# Patient Record
Sex: Female | Born: 1990 | Race: Black or African American | Hispanic: No | Marital: Married | Smoking: Never smoker
Health system: Southern US, Community
[De-identification: ages and names within clinical notes are randomized; demographics above are authoritative.]

## PROBLEM LIST (undated history)

## (undated) ENCOUNTER — Inpatient Hospital Stay (HOSPITAL_COMMUNITY): Payer: Self-pay

## (undated) DIAGNOSIS — K439 Ventral hernia without obstruction or gangrene: Secondary | ICD-10-CM

## (undated) DIAGNOSIS — Z87440 Personal history of urinary (tract) infections: Secondary | ICD-10-CM

## (undated) DIAGNOSIS — O139 Gestational [pregnancy-induced] hypertension without significant proteinuria, unspecified trimester: Secondary | ICD-10-CM

## (undated) HISTORY — PX: NO PAST SURGERIES: SHX2092

## (undated) HISTORY — DX: Personal history of urinary (tract) infections: Z87.440

## (undated) HISTORY — PX: HERNIA REPAIR: SHX51

---
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
## 2011-03-13 ENCOUNTER — Encounter (HOSPITAL_COMMUNITY): Payer: Self-pay | Admitting: *Deleted

## 2012-10-01 ENCOUNTER — Encounter: Payer: Self-pay | Admitting: Obstetrics & Gynecology

## 2012-10-01 ENCOUNTER — Ambulatory Visit (INDEPENDENT_AMBULATORY_CARE_PROVIDER_SITE_OTHER): Payer: BC Managed Care – PPO | Admitting: Obstetrics & Gynecology

## 2012-10-01 VITALS — BP 114/75 | HR 80 | Temp 98.4°F | Ht 60.0 in | Wt 138.0 lb

## 2012-10-01 DIAGNOSIS — Z01419 Encounter for gynecological examination (general) (routine) without abnormal findings: Secondary | ICD-10-CM

## 2012-10-01 DIAGNOSIS — N949 Unspecified condition associated with female genital organs and menstrual cycle: Secondary | ICD-10-CM

## 2012-10-01 DIAGNOSIS — Z3202 Encounter for pregnancy test, result negative: Secondary | ICD-10-CM

## 2012-10-01 DIAGNOSIS — Z309 Encounter for contraceptive management, unspecified: Secondary | ICD-10-CM

## 2012-10-01 LAB — HEMOGLOBIN AND HEMATOCRIT, BLOOD
HCT: 41 % (ref 36.0–46.0)
Hemoglobin: 13.8 g/dL (ref 12.0–15.0)

## 2012-10-01 LAB — POCT URINE PREGNANCY: Preg Test, Ur: NEGATIVE

## 2012-10-01 LAB — RPR

## 2012-10-01 MED ORDER — MEDROXYPROGESTERONE ACETATE 150 MG/ML IM SUSP: 150.0000 mg | INTRAMUSCULAR | Status: DC

## 2012-10-01 NOTE — Patient Instructions (Addendum)
HPV Vaccine Questions and Answers WHAT IS HUMAN PAPILLOMAVIRUS (HPV)? HPV is a virus that can lead to cervical cancer; vulvar and vaginal cancers; penile cancer; anal cancer and genital warts (warts in the genital areas). More than 1 vaccine is available to help you or your child with protection against HPV. Your caregiver can talk to you about which one might give you the best protection. WHO SHOULD GET THIS VACCINE? The HPV vaccine is most effective when given before the onset of sexual activity.  This vaccine is recommended for girls 47 or 21 years of age. It can be given to girls as young as 21 years old.  HPV vaccine can be given to males, 9 through 21 years of age, to reduce the likelihood of acquiring genital warts.  HPV vaccine can be given to males and females aged 25 through 26 years to prevent anal cancer. HPV vaccine is not generally recommended after age 34, because most individuals have been exposed to the HPV virus by that age. HOW EFFECTIVE IS THIS VACCINE?  The vaccine is generally effective in preventing cervical; vulvar and vaginal cancers; penile cancer; anal cancer and genital warts caused by 4 types of HPV. The vaccine is less effective in those individuals who are already infected with HPV. This vaccine does not treat existing HPV, genital warts, pre-cancers or cancers. WILL SEXUALLY ACTIVE INDIVIDUALS BENEFIT FROM THE VACCINE? Sexually active individuals may still benefit from the vaccine but may get less benefit due to previous HPV exposure. HOW AND WHEN IS THE VACCINE ADMINISTERED? The vaccine is given in a series of 3 injections (shots) over a 6 month period in both males and females. The exact timing depends on which specific vaccine your caregiver recommends for you. IS THE HPV VACCINE SAFE?  The federal government has approved the HPV vaccine as safe and effective. This vaccine was tested in both males and females in many countries around the world. The most common  side effect is soreness at the injection site. Since the drug became approved, there has been some concern about patients passing out after being vaccinated, which has led to a recommendation of a 15 minute waiting period following vaccination. This practice may decrease the small risk of passing out. Additionally there is a rare risk of anaphylaxis (an allergic reaction) to the vaccine and a risk of a blood clot among individuals with specific risk factors for a blood clot. DOES THIS VACCINE CONTAIN THIMEROSAL OR MERCURY? No. There is no thimerosal or mercury in the HPV vaccine. It is made of proteins from the outer coat of the virus (HPV). There is no infectious material in this vaccine. WILL GIRLS/WOMEN WHO HAVE BEEN VACCINATED STILL NEED CERVICAL CANCER SCREENING? Yes. There are 3 reasons why women will still need regular cervical cancer screening. First, the vaccine will NOT provide protection against all types of HPV that cause cervical cancer. Vaccinated women will still be at risk for some cancers. Second, some women may not get all required doses of the vaccine (or they may not get them at the recommended times). Therefore, they may not get the vaccine's full benefits. Third, women may not get the full benefit of the vaccine if they receive it after they have already acquired any of the 4 types of HPV. WILL THE HPV VACCINE BE COVERED BY INSURANCE PLANS? While some insurance companies may cover the vaccine, others may not. Most large group insurance plans cover the costs of recommended vaccines. WHAT KIND OF GOVERNMENT PROGRAMS  MAY BE AVAILABLE TO COVER HPV VACCINE? Federal health programs such as Vaccines for Children [facility]) will cover the HPV vaccine. The [facility] program provides free vaccines to children and adolescents under 6 years of age, who are either uninsured, Medicaid-eligible, [place] or [place] Native. There are over 45,000 sites that provide [facility] vaccines including hospital, private  and public clinics. The [facility] program also allows children and adolescents to get VFC vaccines through [facility] or [facility] if their private health insurance does not cover the vaccine. Some states also provide free or low-cost vaccines, at public health clinics, to people without health insurance coverage for vaccines. GENITAL HPV: WHY IS HPV IMPORTANT? Genital HPV is the most common virus transmitted through genital contact, most often during vaginal and anal sex. About 40 types of HPV can infect the genital areas of men and women. While most HPV types cause no symptoms and go away on their own, some types can cause cervical cancer in women. These types also cause other less common genital cancers, including cancers of the penis, anus, vagina (birth canal), and vulva (area around the opening of the vagina). Other types of HPV can cause genital warts in men and women. HOW COMMON IS HPV?   At least 50% of sexually active people will get HPV at some time in their lives. HPV is most common in young women and men who are in their late teens and early 50s.  Anyone who has ever had genital contact with another person can get HPV. Both men and women can get it and pass it on to their sex partners without realizing it. IS HPV THE SAME THING AS HIV OR HERPES? HPV is NOT the same as HIV or Herpes (Herpes simplex virus or HSV). While these are all viruses that can be sexually transmitted, HIV and HSV do not cause the same symptoms or health problems as HPV. CAN HPV AND ITS ASSOCIATED DISEASES BE TREATED? There is no treatment for HPV. There are treatments for the health problems that HPV can cause, such as genital warts, cervical cell changes, and cancers of the cervix (lower part of the womb), vulva, vagina and anus.  HOW IS HPV RELATED TO CERVICAL CANCER? Some types of HPV can infect a woman's cervix and cause the cells to change in an abnormal way. Most of the time, HPV goes  away on its own. When HPV is gone, the cervical cells go back to normal. Sometimes, HPV does not go away. Instead, it lingers (persists) and continues to change the cells on a woman's cervix. These cell changes can lead to cancer over time if they are not treated. ARE THERE OTHER WAYS TO PREVENT CERVICAL CANCER? Regular Pap tests and follow-up can prevent most, but not all, cases of cervical cancer. Pap tests can detect cell changes (or pre-cancers) in the cervix before they turn into cancer. Pap tests can also detect most, but not all, cervical cancers at an early, curable stage. Most women diagnosed with cervical cancer have either never had a Pap test, or not had a Pap test in the last 5 years. There is also an HPV DNA test available for use with the Pap test as part of cervical cancer screening. This test may be ordered for women over 30 or for women who get an unclear (borderline) Pap test result. While this test can tell if a woman has HPV on her cervix, it cannot tell which types of HPV she has.  If the HPV DNA test is negative for HPV DNA, then screening may be done every 3 years. If the HPV DNA test is positive for HPV DNA, then screening should be done every 6 to 12 months. OTHER QUESTIONS ABOUT THE HPV VACCINE WHAT HPV TYPES DOES THE VACCINE PROTECT AGAINST? The HPV vaccine protects against the HPV types that cause most (70%) cervical cancers (types 16 and 18), most (78%) anal cancers (types 16 and 18) and the two HPV types that cause most (90%) genital warts (types 6 and 11). WHAT DOES THE VACCINE NOT PROTECT AGAINST?  Because the vaccine does not protect against all types of HPV, it will not prevent all cases of cervical cancer, anal cancer, other genital cancers or genital warts. About 30% of cervical cancers are not prevented with vaccination, so it will be important for women to continue screening for cervical cancer (regular Pap tests). Also, the vaccine does not prevent about 10% of genital  warts nor will it prevent other sexually transmitted infections (STIs), including HIV. Therefore, it will still be important for sexually active adults to practice safe sex to reduce exposure to HPV and other STI's. HOW LONG DOES VACCINE PROTECTION LAST? WILL A BOOSTER SHOT BE NEEDED? So far, studies have followed women for 5 years and found that they are still protected. Currently, additional (booster) doses are not recommended. More research is being done to find out how long protection will last, and if a booster vaccine is needed years later.  WHY IS THE HPV VACCINE RECOMMENDED AT SUCH A YOUNG AGE? Ideally, males and females should get the vaccine before they are sexually active since this vaccine is most effective in individuals who have not yet acquired any of the HPV vaccine types. Individuals who have not been infected with any of the 4 types of HPV will get the full benefits of the vaccine.  SHOULD PREGNANT WOMEN BE VACCINATED? The vaccine is not recommended for pregnant women. There has been limited research looking at vaccine safety for pregnant women and their developing fetus. Studies suggest that the vaccine has not caused health problems during pregnancy, nor has it caused health problems for the infant. Pregnant women should complete their pregnancy before getting the vaccine. If a woman finds out she is pregnant after she has started getting the vaccine series, she should complete her pregnancy before finishing the 3 doses. SHOULD BREASTFEEDING MOTHERS BE VACCINATED? Mothers nursing their babies may get the vaccine because the virus is inactivated and will not harm the mother or baby. WILL INDIVIDUALS BE PROTECTED AGAINST HPV AND RELATED DISEASES, EVEN IF THEY DO NOT GET ALL 3 DOSES? It is not yet known how much protection individuals will get from receiving only 1 or 2 doses of the vaccine. For this reason, it is very important that individuals get all 3 doses of the vaccine. WILL  CHILDREN BE REQUIRED TO BE VACCINATED TO ENTER SCHOOL? There are no federal laws that require children or adolescents to get vaccinated. All school entry laws are state laws so they vary from state to state. To find out what vaccines are needed for children or adolescents to enter school in your state, check with your state health department or board of education. ARE THERE OTHER WAYS TO PREVENT HPV? The only sure way to prevent HPV is to abstain from all sexual activity. Sexually active adults can reduce their risk by being in a mutually monogamous relationship with someone who has had no other sex partners.  But even individuals with only 1 lifetime sex partner can get HPV, if their partner has had a previous partner with HPV. It is unknown how much protection condoms provide against HPV, since areas that are not covered by a condom can be exposed to the virus. However, condoms may reduce the risk of genital warts and cervical cancer. They can also reduce the risk of HIV and some other sexually transmitted infections (STIs), when used consistently and correctly (all the time and the right way). Document Released: 05/14/2005 Document Revised: 08/06/2011 Document Reviewed: 01/07/2009 [facility] Patient Information 2013 [place], [place].  Safer Sex Your caregiver wants you to have this information about the infections that can be transmitted from sexual contact and how to prevent them. The idea behind safer sex is that you can be sexually active, and at the same time reduce the risk of giving or getting a sexually transmitted disease (STD). Every person should be aware of how to prevent him or herself and his or her sex partner from getting an STD. CAUSES OF STDS STDs are transmitted by sharing body fluids, which contain viruses and bacteria. The following fluids all transmit infections during sexual intercourse and sex acts:  Semen.  Saliva.  Urine.  Blood.  Vaginal mucus. Examples of STDs  include:  Chlamydia.  Gonorrhea.  Genital herpes.  Hepatitis B.  Human immunodeficiency virus or acquired immunodeficiency syndrome (HIV or AIDS).  Syphilis.  Trichomonas.  Pubic lice.  Human papillomavirus (HPV), which may include:  Genital warts.  Cervical dysplasia.  Cervical cancer (can develop with certain types of HPV). SYMPTOMS  Sexual diseases often cause few or no symptoms until they are advanced, so a person can be infected and spread the infection without knowing it. Some STDs respond to treatment very well. Others, like HIV and herpes, cannot be cured, but are treated to reduce their effects. Specific symptoms include:  Abnormal vaginal discharge.  Irritation or itching in and around the vagina, and in the pubic hair.  Pain during sexual intercourse.  Bleeding during sexual intercourse.  Pelvic or abdominal pain.  Fever.  Growths in and around the vagina.  An ulcer in or around the vagina.  Swollen glands in the groin area. DIAGNOSIS   Blood tests.  Pap test.  Culture test of abnormal vaginal discharge.  A test that applies a solution and examines the cervix with a lighted magnifying scope (colposcopy).  A test that examines the pelvis with a lighted tube, through a small incision (laparoscopy). TREATMENT  The treatment will depend on the cause of the STD.  Antibiotic treatment by injection, oral, creams, or suppositories in the vagina.  Over-the-counter medicated shampoo, to get rid of pubic lice.  Removing or treating growths with medicine, freezing, burning (electrocautery), or surgery.  Surgery treatment for HPV of the cervix.  Supportive medicines for herpes, HIV, AIDS, and hepatitis. Being careful cannot eliminate all risk of infection, but sex can be made much safer. Safe sexual practices include body massage and gentle touching. Masturbation is safe, as long as body fluids do not contact skin that has sores or cuts. Dry kissing  and oral sex on a man wearing a latex condom or on a woman wearing a female condom is also safe. Slightly less safe is intercourse while the man wears a latex condom or wet kissing. It is also safer to have one sex partner that you know is not having sex with anyone else. LENGTH OF ILLNESS An STD might be treated and  cured in a week, sometimes a month, or more. And it can linger with symptoms for many years. STDs can also cause damage to the female organs. This can cause chronic pain, infertility, and recurrence of the STD, especially herpes, hepatitis, HIV, and HPV. HOME CARE INSTRUCTIONS AND PREVENTION  Alcohol and recreational drugs are often the reason given for not practicing safer sex. These substances affect your judgment. Alcohol and recreational drugs can also impair your immune system, making you more vulnerable to disease.  Do not engage in risky and dangerous sexual practices, including:  Vaginal or anal sex without a condom.  Oral sex on a man without a condom.  Oral sex on a woman without a female condom.  Using saliva to lubricate a condom.  Any other sexual contact in which body fluids or blood from one partner contact the other partner.  You should use only latex condoms for men and water soluble lubricants. Petroleum based lubricants or oils used to lubricate a condom will weaken the condom and increase the chance that it will break.  Think very carefully before having sex with anyone who is high risk for STDs and HIV. This includes IV drug users, people with multiple sexual partners, or people who have had an STD, or a positive hepatitis or HIV blood test.  Remember that even if your partner has had only one previous partner, their previous partner might have had multiple partners. If so, you are at high risk of being exposed to an STD. You and your sex partner should be the only sex partners with each other, with no one else involved.  A vaccine is available for hepatitis  B and HPV through your caregiver or the Public Health Department. Everyone should be vaccinated with these vaccines.  Avoid risky sex practices. Sex acts that can break the skin make you more likely to get an STD. SEEK MEDICAL CARE IF:   If you think you have an STD, even if you do not have any symptoms. Contact your caregiver for evaluation and treatment, if needed.  You think or know your sex partner has acquired an STD.  You have any of the symptoms mentioned above. Document Released: 06/21/2004 Document Revised: 08/06/2011 Document Reviewed: 04/13/2009 [facility] Patient Information 2013 [place], [place].  Pelvic Pain Pelvic pain is pain below the belly button and located between your hips. Acute pain may last a few hours or days. Chronic pelvic pain may last weeks and months. The cause may be different for different types of pain. The pain may be dull or sharp, mild or severe and can interfere with your daily activities. Write down and tell your caregiver:   Exactly where the pain is located.  If it comes and goes or is there all the time.  When it happens (with sex, urination, bowel movement, etc.)  If the pain is related to your menstrual period or stress. Your caregiver will take a full history and do a complete physical exam and Pap test. CAUSES   Painful menstrual periods (dysmenorrhea).  Normal ovulation (Mittelschmertz) that occurs in the middle of the menstrual cycle every month.  The pelvic organs get engorged with blood just before the menstrual period (pelvic congestive syndrome).  Scar tissue from an infection or past surgery (pelvic adhesions).  Cancer of the female pelvic organs. When there is pain with cancer, it has been there for a long time.  The lining of the uterus (endometrium) abnormally grows in places like the pelvis and on  the pelvic organs (endometriosis).  A form of endometriosis with the lining of the uterus present inside of the muscle tissue of  the uterus (adenomyosis).  Fibroid tumor (noncancerous) in the uterus.  Bladder problems such as infection, bladder spasms of the muscle tissue of the bladder.  Intestinal problems (irritable bowel syndrome, colitis, an ulcer or gastrointestinal infection).  Polyps of the cervix or uterus.  Pregnancy in the tube (ectopic pregnancy).  The opening of the cervix is too small for the menstrual blood to flow through it (cervical stenosis).  Physical or sexual abuse (past or present).  Musculo-skeletal problems from poor posture, problems with the vertebrae of the lower back or the uterine pelvic muscles falling (prolapse).  Psychological problems such as depression or stress.  IUD (intrauterine device) in the uterus. DIAGNOSIS  Tests to make a diagnosis depends on the type, location, severity and what causes the pain to occur. Tests that may be needed include:  Blood tests.  Urine tests  Ultrasound.  X-rays.  CT Scan.  MRI.  Laparoscopy.  Major surgery. TREATMENT  Treatment will depend on the cause of the pain, which includes:  Prescription or over-the-counter pain medication.  Antibiotics.  Birth control pills.  Hormone treatment.  Nerve blocking injections.  Physical therapy.  Antidepressants.  Counseling with a psychiatrist or psychologist.  Minor or major surgery. HOME CARE INSTRUCTIONS   Only take over-the-counter or prescription medicines for pain, discomfort or fever as directed by your caregiver.  Follow your caregiver's advice to treat your pain.  Rest.  Avoid sexual intercourse if it causes the pain.  Apply warm or cold compresses (which ever works best) to the pain area.  Do relaxation exercises such as yoga or meditation.  Try acupuncture.  Avoid stressful situations.  Try group therapy.  If the pain is because of a stomach/intestinal upset, drink clear liquids, eat a bland light food diet until the symptoms go away. SEEK MEDICAL  CARE IF:   You need stronger prescription pain medication.  You develop pain with sexual intercourse.  You have pain with urination.  You develop a temperature of 102 F (38.9 C) with the pain.  You are still in pain after 4 hours of taking prescription medication for the pain.  You need depression medication.  Your IUD is causing pain and you want it removed. SEEK IMMEDIATE MEDICAL CARE IF:  You develop very severe pain or tenderness.  You faint, have chills, severe weakness or dehydration.  You develop heavy vaginal bleeding or passing solid tissue.  You develop a temperature of 102 F (38.9 C) with the pain.  You have blood in the urine.  You are being physically or sexually abused.  You have uncontrolled vomiting and diarrhea.  You are depressed and afraid of harming yourself or someone else. Document Released: 06/21/2004 Document Revised: 08/06/2011 Document Reviewed: 03/18/2008 [facility] Patient Information 2013 [place], [place].  Health Maintenance, 49- to 78-Year-Old SCHOOL PERFORMANCE After high school completion, the young adult may be attending college, Scientist, product/process development or vocational school, or entering the [employer] or the work force. SOCIAL AND EMOTIONAL DEVELOPMENT The young adult establishes adult relationships and explores sexual identity. Young adults may be living at home or in a college dorm or apartment. Increasing independence is important with young adults. Throughout adolescence, teens should assume responsibility of their own health care. IMMUNIZATIONS Most young adults should be fully vaccinated. A booster dose of Tdap (tetanus, diphtheria, and pertussis, or "whooping cough"), a dose of meningococcal vaccine to protect  against a certain type of bacterial meningitis, hepatitis A, human papillomarvirus (HPV), chickenpox, or measles vaccines may be indicated, if not given at an earlier age. Annual influenza or "flu" vaccination should be considered during flu  season.  TESTING Annual screening for vision and hearing problems is recommended. Vision should be screened objectively at least once between 64 and 63 years of age. The young adult may be screened for anemia or tuberculosis. Young adults should have a blood test to check for high cholesterol during this time period. Young adults should be screened for use of alcohol and drugs. If the young adult is sexually active, screening for sexually transmitted infections, pregnancy, or HIV may be performed. Screening for cervical cancer should be performed within 3 years of beginning sexual activity. NUTRITION AND ORAL HEALTH  Adequate calcium intake is important. Consume 3 servings of low-fat milk and dairy products daily. For those who do not drink milk or consume dairy products, calcium enriched foods, such as juice, bread, or cereal, dark, leafy greens, or canned fish are alternate sources of calcium.  Drink plenty of water. Limit fruit juice to 8 to 12 ounces per day. Avoid sugary beverages or sodas.  Discourage skipping meals, especially breakfast. Teens should eat a good variety of vegetables and fruits, as well as lean meats.  Avoid high fat, high salt, and high sugar foods, such as candy, chips, and cookies.  Encourage young adults to participate in meal planning and preparation.  Eat meals together as a family whenever possible. Encourage conversation at mealtime.  Limit fast food choices and eating out at restaurants.  Brush teeth twice a day and floss.  Schedule dental exams twice a year. SLEEP Regular sleep habits are important. PHYSICAL, SOCIAL, AND EMOTIONAL DEVELOPMENT  One hour of regular physical activity daily is recommended. Continue to participate in sports.  Encourage young adults to develop their own interests and consider community service or volunteerism.  Provide guidance to the young adult in making decisions about college and work plans.  Make sure that young adults  know that they should never be in a situation that makes them uncomfortable, and they should tell partners if they do not want to engage in sexual activity.  Talk to the young adult about body image. Eating disorders may be noted at this time. Young adults may also be concerned about being overweight. Monitor the young adult for weight gain or loss.  Mood disturbances, depression, anxiety, alcoholism, or attention problems may be noted in young adults. Talk to the caregiver if there are concerns about mental illness.  Negotiate limit setting and independent decision making.  Encourage the young adult to handle conflict without physical violence.  Avoid loud noises which may impair hearing.  Limit television and computer time to 2 hours per day. Individuals who engage in excessive sedentary activity are more likely to become overweight. RISK BEHAVIORS  Sexually active young adults need to take precautions against pregnancy and sexually transmitted infections. Talk to young adults about contraception.  Provide a tobacco-free and drug-free environment for the young adult. Talk to the young adult about drug, tobacco, and alcohol use among friends or at friends' homes. Make sure the young adult knows that smoking tobacco or marijuana and taking drugs have health consequences and may impact brain development.  Teach the young adult about appropriate use of over-the-counter or prescription medicines.  Establish guidelines for driving and for riding with friends.  Talk to young adults about the risks of drinking and  driving or boating. Encourage the young adult to call you if he or she or friends have been drinking or using drugs.  Remind young adults to wear seat belts at all times in cars and life vests in boats.  Young adults should always wear a properly fitted helmet when they are riding a bicycle.  Use caution with all-terrain vehicles (ATVs) or other motorized vehicles.  Do not keep  handguns in the home. (If you do, the gun and ammunition should be locked separately and out of the young adult's access.)  Equip your home with smoke detectors and change the batteries regularly. Make sure all family members know the fire escape plans for your home.  Teach young adults not to swim alone and not to dive in shallow water.  All individuals should wear sunscreen that protects against UVA and UVB light with at least a sun protection factor (SPF) of 30 when out in the sun. This minimizes sun burning. WHAT'S NEXT? Young adults should visit their pediatrician or family physician yearly. By young adulthood, health care should be transitioned to a family physician or internal medicine specialist. Sexually active females may want to begin annual physical exams with a gynecologist. Document Released: 08/09/2006 Document Revised: 08/06/2011 Document Reviewed: 08/29/2006 [facility] Patient Information 2013 [place], [place].

## 2012-10-01 NOTE — Progress Notes (Signed)
.   Subjective:     Kayla [name] is a 22 y.o. female here for a routine exam.  Current complaints: abdominal pains over the last two months.  Usually happens twice a week in the morning.  Personal health questionnaire reviewed: no.  See pelvic pain questionnaire.   Gynecologic History Patient's last menstrual period was 09/18/2012. Contraception: none Last Pap: N/A  Last mammogram: N/A  Obstetric History OB History   Grav Para Term Preterm Abortions TAB SAB Ect Mult Living   1 1 1             # Outc Date GA Lbr Len/2nd Wgt Sex Del Anes PTL Lv   1 TRM 5/12 [redacted]w[redacted]d 00:00  M           The following portions of the patient's history were reviewed and updated as appropriate: allergies, current medications, past family history, past medical history, past social history, past surgical history and problem list.  Review of Systems Pertinent items are noted in HPI.    Objective:    General appearance: alert Breasts: normal appearance, no masses or tenderness Abdomen: soft, non-tender; bowel sounds normal; no masses,  no organomegaly Pelvic: cervix normal in appearance, external genitalia normal, no adnexal masses or tenderness, uterus normal size, shape, and consistency and vagina normal without discharge   Informal pelvic U/S: wnl  Assessment:   Subacute pelvic pain--?etiology  Plan:   Restart Depo provera and HPV vaccine next visit in 2 weeks

## 2012-10-02 LAB — PAP IG AND CT-NG NAA
Chlamydia Probe Amp: NEGATIVE
GC Probe Amp: NEGATIVE

## 2012-10-02 LAB — WET PREP BY MOLECULAR PROBE
Candida species: NEGATIVE
Trichomonas vaginosis: NEGATIVE

## 2012-11-03 ENCOUNTER — Encounter (HOSPITAL_COMMUNITY): Payer: Self-pay

## 2012-11-03 ENCOUNTER — Inpatient Hospital Stay (HOSPITAL_COMMUNITY)
Admission: AD | Admit: 2012-11-03 | Discharge: 2012-11-03 | Disposition: A | Discharge status: Home / Self Care | Payer: BC Managed Care – PPO | Source: Ambulatory Visit | Attending: Obstetrics | Admitting: Obstetrics

## 2012-11-03 DIAGNOSIS — R0789 Other chest pain: Secondary | ICD-10-CM

## 2012-11-03 DIAGNOSIS — R109 Unspecified abdominal pain: Secondary | ICD-10-CM | POA: Insufficient documentation

## 2012-11-03 DIAGNOSIS — K5900 Constipation, unspecified: Secondary | ICD-10-CM

## 2012-11-03 DIAGNOSIS — R079 Chest pain, unspecified: Secondary | ICD-10-CM | POA: Insufficient documentation

## 2012-11-03 LAB — COMPREHENSIVE METABOLIC PANEL
Albumin: 3.9 g/dL (ref 3.5–5.2)
Alkaline Phosphatase: 84 U/L (ref 39–117)
BUN: 11 mg/dL (ref 6–23)
CO2: 26 mEq/L (ref 19–32)
Chloride: 104 mEq/L (ref 96–112)
Glucose, Bld: 85 mg/dL (ref 70–99)
Potassium: 4 mEq/L (ref 3.5–5.1)
Total Bilirubin: 0.2 mg/dL — ABNORMAL LOW (ref 0.3–1.2)

## 2012-11-03 LAB — URINALYSIS, ROUTINE W REFLEX MICROSCOPIC
Bilirubin Urine: NEGATIVE
Nitrite: NEGATIVE
Protein, ur: NEGATIVE mg/dL
Urobilinogen, UA: 0.2 mg/dL (ref 0.0–1.0)

## 2012-11-03 LAB — CBC
HCT: 40 % (ref 36.0–46.0)
Hemoglobin: 13.6 g/dL (ref 12.0–15.0)
RBC: 4.66 MIL/uL (ref 3.87–5.11)
RDW: 12.7 % (ref 11.5–15.5)
WBC: 13.4 10*3/uL — ABNORMAL HIGH (ref 4.0–10.5)

## 2012-11-03 LAB — AMYLASE: Amylase: 68 U/L (ref 0–105)

## 2012-11-03 LAB — URINE MICROSCOPIC-ADD ON

## 2012-11-03 LAB — POCT PREGNANCY, URINE: Preg Test, Ur: NEGATIVE

## 2012-11-03 MED ORDER — MAGNESIUM HYDROXIDE 400 MG/5ML PO SUSP
30.0000 mL | Freq: Once | ORAL | Status: AC
  Administered 2012-11-03: 30 mL via ORAL
  Filled 2012-11-03: qty 30

## 2012-11-03 MED ORDER — GI COCKTAIL ~~LOC~~: 30.0000 mL | Freq: Once | ORAL | Status: DC

## 2012-11-03 NOTE — MAU Provider Note (Signed)
Chief Complaint: Chest Pain and Abdominal Pain   First Provider Initiated Contact with Patient 11/03/12 2032     SUBJECTIVE HPI: Kayla [name] is a 22 y.o. G1P1001 who presents with: 1. Generalized, intermittent mid-upper abd pain 6/10 on pain scale x 2 months. 2. Intermittent substernal aching, sometimes sharp chest pain ~ once a week x 1 year, 8/10 at worst. None now.  3. Going several days btw BMs since last month. Last BM 4-5 days ago. Tried Dulcolax x 1 w/ no results.  4. Irreg periods. Last Depo ~1 year ago.    Has not had any other evaluation of these Sx.   History reviewed. No pertinent past medical history. OB History   Grav Para Term Preterm Abortions TAB SAB Ect Mult Living   1 1 1       1      # Outc Date GA Lbr Len/2nd Wgt Sex Del Anes PTL Lv   1 TRM 5/12 [redacted]w[redacted]d 00:00  M         History reviewed. No pertinent past surgical history. History   Social History  . Marital Status: Single    Spouse Name: N/A    Number of Children: N/A  . Years of Education: N/A   Occupational History  . Not on file.   Social History Main Topics  . Smoking status: Never Smoker   . Smokeless tobacco: Never Used  . Alcohol Use: No  . Drug Use: No  . Sexually Active: Yes    Birth Control/ Protection: None   Other Topics Concern  . Not on file   Social History Narrative  . No narrative on file   No current facility-administered medications on file prior to encounter.   No current outpatient prescriptions on file prior to encounter.   No Known Allergies  ROS: Pos for constipation, abd discomfort/pressure, heart palpitations, ? Runs of tachycardia. Neg for fever, chills, N/V/D, low abd pain, SOB, pain radiating down left arm or up neck, vaginal bleeding or vaginal discharge.   OBJECTIVE Blood pressure 124/74, pulse 78, temperature 98.7 F (37.1 C), temperature source Oral, resp. rate 16, height 5\' 2"  (1.575 m), weight 64.411 kg (142 lb), last menstrual period 09/17/2012, SpO2  100.00%. GENERAL: Well-developed, well-nourished female in no acute distress.  HEENT: Normocephalic. No JVD.  HEART: normal rate and rhythm RESP: normal effort. CTAB.  CHEST: NT. ABDOMEN: Mildly distended, non-tender. Pos BS x 4. ? Stool palpated in transverse colon. No hepato-splenomegaly. EXTREMITIES: Nontender, no edema NEURO: Alert and oriented SPECULUM EXAM: Deferred  LAB RESULTS Results for orders placed during the hospital encounter of 11/03/12 (from the past 24 hour(s))  URINALYSIS, ROUTINE W REFLEX MICROSCOPIC     Status: Abnormal   Collection Time    11/03/12  6:20 PM      Result Value Range   Color, Urine YELLOW  YELLOW   APPearance CLEAR  CLEAR   Specific Gravity, Urine >1.030 (*) 1.005 - 1.030   pH 6.0  5.0 - 8.0   Glucose, UA NEGATIVE  NEGATIVE mg/dL   Hgb urine dipstick SMALL (*) NEGATIVE   Bilirubin Urine NEGATIVE  NEGATIVE   Ketones, ur NEGATIVE  NEGATIVE mg/dL   Protein, ur NEGATIVE  NEGATIVE mg/dL   Urobilinogen, UA 0.2  0.0 - 1.0 mg/dL   Nitrite NEGATIVE  NEGATIVE   Leukocytes, UA NEGATIVE  NEGATIVE  URINE MICROSCOPIC-ADD ON     Status: Abnormal   Collection Time    11/03/12  6:20 PM  Result Value Range   Squamous Epithelial / LPF FEW (*) RARE   WBC, UA 0-2  <3 WBC/hpf   Urine-Other TALC POWDER PRESENT    POCT PREGNANCY, URINE     Status: None   Collection Time    11/03/12  6:32 PM      Result Value Range   Preg Test, Ur NEGATIVE  NEGATIVE  CBC     Status: Abnormal   Collection Time    11/03/12  6:45 PM      Result Value Range   WBC 13.4 (*) 4.0 - 10.5 K/uL   RBC 4.66  3.87 - 5.11 MIL/uL   Hemoglobin 13.6  12.0 - 15.0 g/dL   HCT 29.5  62.1 - 30.8 %   MCV 85.8  78.0 - 100.0 fL   MCH 29.2  26.0 - 34.0 pg   MCHC 34.0  30.0 - 36.0 g/dL   RDW 65.7  84.6 - 96.2 %   Platelets 287  150 - 400 K/uL  COMPREHENSIVE METABOLIC PANEL     Status: Abnormal   Collection Time    11/03/12  6:45 PM      Result Value Range   Sodium 138  135 - 145 mEq/L    Potassium 4.0  3.5 - 5.1 mEq/L   Chloride 104  96 - 112 mEq/L   CO2 26  19 - 32 mEq/L   Glucose, Bld 85  70 - 99 mg/dL   BUN 11  6 - 23 mg/dL   Creatinine, Ser 9.52  0.50 - 1.10 mg/dL   Calcium 9.7  8.4 - 84.1 mg/dL   Total Protein 6.8  6.0 - 8.3 g/dL   Albumin 3.9  3.5 - 5.2 g/dL   AST 17  0 - 37 U/L   ALT 10  0 - 35 U/L   Alkaline Phosphatase 84  39 - 117 U/L   Total Bilirubin 0.2 (*) 0.3 - 1.2 mg/dL   GFR calc non Af Amer >90  >90 mL/min   GFR calc Af Amer >90  >90 mL/min    IMAGING No results found.  MAU COURSE  ASSESSMENT 1. Constipation   2. Chest pain, midsternal-possible R/T tachycardic episodes. Low suspicion for serious cardiac condition.    PLAN Discharge home in stable condition. Milk of magnesia. Increase fluids and fiber.  If no BM in 2 days, use Fleet's enema. If no BM 24 hours after that return to MAU for soap suds enema.   F/U w/ PCP RE: possible tachycardia episodes, chest discomfort. [place] for worsening chest pain, SOB,      Medication List    ASK your doctor about these medications       BIOTIN PO  Take 1 tablet by mouth 2 (two) times daily.         [place], [place] 11/03/2012  8:19 PM

## 2012-11-03 NOTE — MAU Note (Signed)
Patient states she has had pain across the top part of her chest for about one year, comes and goes. Has been having right sided abdominal pain for about 3 months. Has not had a period since 4-23 and has done several home pregnancy tests that have been negative. Denies bleeding, nausea, vomiting or discharge.

## 2012-11-03 NOTE — MAU Note (Signed)
Patient is in with c/o right mid abdominal sharp pain since April 2014. She states that she stopped having regular bowel movements in may 2014. She states that otc dulcolax isn't working. She states that she have chest pain (aching ) intermittently for a year. She denies vaginal bleeding or abnormal vaginal discharge. She states that her lmp 4/23. Her abdomen is not tender to touch.

## 2012-11-18 ENCOUNTER — Emergency Department (INDEPENDENT_AMBULATORY_CARE_PROVIDER_SITE_OTHER)
Admission: EM | Admit: 2012-11-18 | Discharge: 2012-11-18 | Disposition: A | Discharge status: Home / Self Care | Payer: BC Managed Care – PPO | Source: Home / Self Care | Attending: Family Medicine | Admitting: Family Medicine

## 2012-11-18 ENCOUNTER — Encounter (HOSPITAL_COMMUNITY): Payer: Self-pay | Admitting: *Deleted

## 2012-11-18 DIAGNOSIS — N390 Urinary tract infection, site not specified: Secondary | ICD-10-CM

## 2012-11-18 LAB — POCT URINALYSIS DIP (DEVICE)
Protein, ur: 30 mg/dL — AB
Specific Gravity, Urine: 1.025 (ref 1.005–1.030)

## 2012-11-18 LAB — POCT PREGNANCY, URINE: Preg Test, Ur: NEGATIVE

## 2012-11-18 MED ORDER — CEPHALEXIN 500 MG PO CAPS
500.0000 mg | ORAL_CAPSULE | Freq: Four times a day (QID) | ORAL | Status: DC
Start: 2012-11-18 — End: 2013-01-09

## 2012-11-18 NOTE — ED Provider Notes (Signed)
   History    CSN: [id number] Arrival date & time 11/18/12  1235  First MD Initiated Contact with Patient 11/18/12 1328     Chief Complaint  Patient presents with  . Urinary Frequency   (Consider location/radiation/quality/duration/timing/severity/associated sxs/prior Treatment) Patient is a 22 y.o. female presenting with frequency. The history is provided by the patient.  Urinary Frequency This is a new problem. The current episode started more than 1 week ago (3 wk h/o sx). The problem has not changed since onset.Pertinent negatives include no abdominal pain.   History reviewed. No pertinent past medical history. History reviewed. No pertinent past surgical history. No family history on file. History  Substance Use Topics  . Smoking status: Never Smoker   . Smokeless tobacco: Never Used  . Alcohol Use: No   OB History   Grav Para Term Preterm Abortions TAB SAB Ect Mult Living   1 1 1       1      Review of Systems  Constitutional: Negative.   Gastrointestinal: Negative.  Negative for abdominal pain.  Genitourinary: Positive for urgency and frequency. Negative for hematuria.    Allergies  Review of patient's allergies indicates no known allergies.  Home Medications   Current Outpatient Rx  Name  Route  Sig  Dispense  Refill  . BIOTIN PO   Oral   Take 1 tablet by mouth 2 (two) times daily.         . cephALEXin (KEFLEX) 500 MG capsule   Oral   Take 1 capsule (500 mg total) by mouth 4 (four) times daily. Take all of medicine and drink lots of fluids   20 capsule   0    BP 127/65  Pulse 73  Temp(Src) 98.3 F (36.8 C) (Oral)  Resp 16  SpO2 100%  LMP 09/17/2012 Physical Exam  Nursing note and vitals reviewed. Constitutional: She is oriented to person, place, and time. She appears well-developed and well-nourished.  Abdominal: Soft. Bowel sounds are normal. She exhibits no mass. There is no tenderness. There is no rebound and no guarding.  Neurological: She  is alert and oriented to person, place, and time.  Skin: Skin is warm and dry.    ED Course  Procedures (including critical care time) Labs Reviewed  POCT URINALYSIS DIP (DEVICE) - Abnormal; Notable for the following:    Ketones, ur TRACE (*)    Hgb urine dipstick TRACE (*)    Protein, ur 30 (*)    Leukocytes, UA SMALL (*)    All other components within normal limits  POCT PREGNANCY, URINE   No results found. 1. UTI (lower urinary tract infection)     MDM    Linna Hoff, MD 11/18/12 1358

## 2012-11-18 NOTE — ED Notes (Signed)
Pt  Reports  Symptoms  Of  Urinary  Frequency  With  The  Sensation of  Not  Emptying  Her  Bladder        Completely    She  Reports  The  Symptoms  Have  Persisted  X  3  Weeks    She  Ambulated  To  Room with a  Steady  Fluid  Gait

## 2012-12-04 ENCOUNTER — Ambulatory Visit: Payer: BC Managed Care – PPO | Admitting: Obstetrics & Gynecology

## 2013-01-09 ENCOUNTER — Emergency Department (HOSPITAL_COMMUNITY)
Admission: EM | Admit: 2013-01-09 | Discharge: 2013-01-09 | Disposition: A | Discharge status: Home / Self Care | Payer: BC Managed Care – PPO | Attending: Emergency Medicine | Admitting: Emergency Medicine

## 2013-01-09 ENCOUNTER — Encounter (HOSPITAL_COMMUNITY): Payer: Self-pay | Admitting: Emergency Medicine

## 2013-01-09 DIAGNOSIS — R0789 Other chest pain: Secondary | ICD-10-CM | POA: Insufficient documentation

## 2013-01-09 DIAGNOSIS — R079 Chest pain, unspecified: Secondary | ICD-10-CM

## 2013-01-09 DIAGNOSIS — R21 Rash and other nonspecific skin eruption: Secondary | ICD-10-CM

## 2013-01-09 DIAGNOSIS — R0602 Shortness of breath: Secondary | ICD-10-CM | POA: Insufficient documentation

## 2013-01-09 MED ORDER — HYDROXYZINE HCL 25 MG PO TABS: 25.0000 mg | ORAL_TABLET | Freq: Four times a day (QID) | ORAL | Status: DC

## 2013-01-09 MED ORDER — HYDROXYZINE HCL 25 MG PO TABS
25.0000 mg | ORAL_TABLET | Freq: Once | ORAL | Status: AC
  Administered 2013-01-09: 25 mg via ORAL
  Filled 2013-01-09: qty 1

## 2013-01-09 NOTE — ED Notes (Signed)
Pt presents with pains in chest that come at least 5 times a week and last for 15 minutes at a time, pain has been present for past year and a half after having her son.  Pt also would like to be seen for rash and itching to bilateral hands.  Red bumps noted to both hands.  NSR on monitor- will continue to monitor pt.

## 2013-01-09 NOTE — ED Provider Notes (Signed)
CSN: [id number]     Arrival date & time 01/09/13  1223 History     First MD Initiated Contact with Patient 01/09/13 1235     Chief Complaint  Patient presents with  . Allergic Reaction   (Consider location/radiation/quality/duration/timing/severity/associated sxs/prior Treatment) HPI Comments: Patient is a 22 year old female who presents today with hand itching since yesterday. She was outside yesterday, but she was not in the grass only on the sidewalk. She developed a small red bumps that have been gradually worsening on the palms of her hand. She has tried hydrocortisone cream and Benadryl with no relief. No one else has this rash including her 34 year old son. No recent tick bites. No new soaps or detergents. No fevers, myalgias, arthralgias, headache. She also reports chest pain for the past year and a half. The chest pain is intermittent. The pain is sharp and associated with shortness of breath. The pain lasts for 15 minutes when it comes it is self-limited. She states she gets the pain approximately 5 times a week and when she gets the pain it intermittently occurs all day long. She cannot find a trigger to this chest pain. She states sometimes it wakes her from sleep.   The history is provided by the patient. No language interpreter was used.    History reviewed. No pertinent past medical history. History reviewed. No pertinent past surgical history. No family history on file. History  Substance Use Topics  . Smoking status: Never Smoker   . Smokeless tobacco: Never Used  . Alcohol Use: No   OB History   Grav Para Term Preterm Abortions TAB SAB Ect Mult Living   1 1 1       1      Review of Systems  Constitutional: Negative for fever and chills.  Respiratory: Positive for shortness of breath.   Cardiovascular: Positive for chest pain.  Gastrointestinal: Negative for nausea, vomiting and abdominal pain.  Musculoskeletal: Negative for myalgias and arthralgias.  Skin: Positive  for rash.  All other systems reviewed and are negative.    Allergies  Review of patient's allergies indicates no known allergies.  Home Medications   Current Outpatient Rx  Name  Route  Sig  Dispense  Refill  . BIOTIN PO   Oral   Take 1 tablet by mouth 2 (two) times daily.         . cephALEXin (KEFLEX) 500 MG capsule   Oral   Take 1 capsule (500 mg total) by mouth 4 (four) times daily. Take all of medicine and drink lots of fluids   20 capsule   0    BP 136/82  Pulse 94  Temp(Src) 98.2 F (36.8 C) (Oral)  Resp 16  SpO2 98% Physical Exam  Nursing note and vitals reviewed. Constitutional: She is oriented to person, place, and time. Vital signs are normal. She appears well-developed and well-nourished.  Non-toxic appearance. She does not have a sickly appearance. She does not appear ill. No distress.  Patient is very well appearing. In no distress and laying comfortably on bed.   HENT:  Head: Normocephalic and atraumatic.  Right Ear: External ear normal.  Left Ear: External ear normal.  Nose: Nose normal.  Mouth/Throat: Uvula is midline, oropharynx is clear and moist and mucous membranes are normal.  Eyes: Conjunctivae are normal.  Neck: Normal range of motion.  Cardiovascular: Normal rate, regular rhythm, normal heart sounds, intact distal pulses and normal pulses.   Pulmonary/Chest: Effort normal and breath sounds  normal. No stridor. No respiratory distress. She has no wheezes. She has no rales.  Abdominal: Soft. She exhibits no distension.  Musculoskeletal: Normal range of motion.  Neurological: She is alert and oriented to person, place, and time. She has normal strength.  Skin: Skin is warm and dry. Rash noted. She is not diaphoretic.  Multiple small papules on palms on hands and soles of feet.   Psychiatric: She has a normal mood and affect. Her behavior is normal.    ED Course   Procedures (including critical care time)   Date: 01/09/2013  Rate: 84   Rhythm: normal sinus rhythm  QRS Axis: normal  Intervals: normal  ST/T Wave abnormalities: nonspecific T wave changes  Conduction Disutrbances:none  Narrative Interpretation:   Old EKG Reviewed: none available    Labs Reviewed - No data to display No results found. 1. Rash   2. Intermittent chest pain     MDM  Patient is a 22 year old female who presents with rash on soles of feet and palm of hand. 89 year old son without rash. Very well appearing. Afebrile. No concern for RMSF. Very low suspicion for hand, foot, and mouth disease. Suspect allergic reaction. No difficulty breathing. Maintaining secretions. Only complaint is pruritis. Also has had intermittent chest pain x 1.5 years. Doubt serious cause of this chest pain. EKG WNL. Will give cardiology follow up. Dr. Fayrene Fearing evaluated this patient and agrees with plan. Return instructions given. Vital signs stable for discharge. Patient / Family / Caregiver informed of clinical course, understand medical decision-making process, and agree with plan.     Mora Bellman, PA-C 01/09/13 1356

## 2013-01-09 NOTE — ED Notes (Signed)
Hand itching since yesterday left worse than right no diff breathing and feels occ  For 1 1/2 years she has had pins and needle feeling in chest

## 2013-01-10 NOTE — ED Provider Notes (Addendum)
Medical screening examination/treatment/procedure(s) were conducted as a shared visit with non-physician practitioner(s) and myself.  I personally evaluated the patient during the encounter  No urticaria noted.  Clear pharynx.  No Wheezing.  Agree with history from [facility], [place].  Plan is assdiscussed with patient.   Claudean Kinds, MD 01/10/13 1108  Claudean Kinds, MD 01/23/13 [phone]

## 2013-04-13 ENCOUNTER — Telehealth: Payer: Self-pay | Admitting: *Deleted

## 2013-04-17 ENCOUNTER — Encounter (HOSPITAL_COMMUNITY): Payer: Self-pay | Admitting: *Deleted

## 2013-04-17 ENCOUNTER — Inpatient Hospital Stay (HOSPITAL_COMMUNITY)
Admission: AD | Admit: 2013-04-17 | Discharge: 2013-04-17 | Disposition: A | Discharge status: Home / Self Care | Payer: BC Managed Care – PPO | Source: Ambulatory Visit | Attending: Obstetrics & Gynecology | Admitting: Obstetrics & Gynecology

## 2013-04-17 DIAGNOSIS — R109 Unspecified abdominal pain: Secondary | ICD-10-CM | POA: Insufficient documentation

## 2013-04-17 DIAGNOSIS — N926 Irregular menstruation, unspecified: Secondary | ICD-10-CM

## 2013-04-17 LAB — WET PREP, GENITAL
Trich, Wet Prep: NONE SEEN
Yeast Wet Prep HPF POC: NONE SEEN

## 2013-04-17 LAB — URINALYSIS, ROUTINE W REFLEX MICROSCOPIC
Bilirubin Urine: NEGATIVE
Leukocytes, UA: NEGATIVE
Nitrite: NEGATIVE
Specific Gravity, Urine: >1.03 — ABNORMAL HIGH (ref 1.005–1.030)
pH: 6 (ref 5.0–8.0)

## 2013-04-17 LAB — POCT PREGNANCY, URINE: Preg Test, Ur: NEGATIVE

## 2013-04-17 NOTE — MAU Provider Note (Signed)
CC: Abdominal Cramping    First Provider Initiated Contact with Patient 04/17/13 1952      HPI Kayla [name] is a 22 y.o. G1P1001 who presents with report of intermittent lower abdominal cramping all month and amenorrheic. LMP 02/26/13. Menses interval q 4wks and regular. Has not missed a period before. Has unprotected intercourse, but has appointment in 4 [facility] at [facility] for [facility] management. Denies irritative vaginal discharge or stress. No new sex partner.    Past Medical History  Diagnosis Date  . Medical history non-contributory     OB History  Gravida Para Term Preterm AB SAB TAB Ectopic Multiple Living  1 1 1       1     # Outcome Date GA Lbr Len/2nd Weight Sex Delivery Anes PTL Lv  1 TRM 10/16/10 [redacted]w[redacted]d   M          Past Surgical History  Procedure Laterality Date  . No past surgeries      History   Social History  . Marital Status: Single    Spouse Name: N/A    Number of Children: N/A  . Years of Education: N/A   Occupational History  . Not on file.   Social History Main Topics  . Smoking status: Never Smoker   . Smokeless tobacco: Never Used  . Alcohol Use: No  . Drug Use: No  . Sexual Activity: Yes    Birth Control/ Protection: None   Other Topics Concern  . Not on file   Social History Narrative  . No narrative on file    No current facility-administered medications on file prior to encounter.   No current outpatient prescriptions on file prior to encounter.    No Known Allergies  ROS Pertinent items in HPI  PHYSICAL EXAM Filed Vitals:   04/17/13 1956  BP: 122/75  Pulse: 82  Temp:   Resp:    General: Well nourished, well developed female in no acute distress Cardiovascular: Normal rate Respiratory: Normal effort Abdomen: Soft, nontender Back: No CVAT Extremities: No edema Neurologic: Alert and oriented Speculum exam: NEFG; vagina with physiologic discharge, no blood; cervix clean Bimanual exam: cervix closed, no CMT;  uterus NSSP, slightly TTP; no adnexal tenderness or masses   LAB RESULTS Results for orders placed during the hospital encounter of 04/17/13 (from the past 24 hour(s))  URINALYSIS, ROUTINE W REFLEX MICROSCOPIC     Status: Abnormal   Collection Time    04/17/13  7:31 PM      Result Value Range   Color, Urine YELLOW  YELLOW   APPearance CLEAR  CLEAR   Specific Gravity, Urine >1.030 (*) 1.005 - 1.030   pH 6.0  5.0 - 8.0   Glucose, UA NEGATIVE  NEGATIVE mg/dL   Hgb urine dipstick NEGATIVE  NEGATIVE   Bilirubin Urine NEGATIVE  NEGATIVE   Ketones, ur NEGATIVE  NEGATIVE mg/dL   Protein, ur NEGATIVE  NEGATIVE mg/dL   Urobilinogen, UA 0.2  0.0 - 1.0 mg/dL   Nitrite NEGATIVE  NEGATIVE   Leukocytes, UA NEGATIVE  NEGATIVE  POCT PREGNANCY, URINE     Status: None   Collection Time    04/17/13  7:42 PM      Result Value Range   Preg Test, Ur NEGATIVE  NEGATIVE  WET PREP, GENITAL     Status: Abnormal   Collection Time    04/17/13  8:15 PM      Result Value Range   Yeast Wet Prep HPF POC  NONE SEEN  NONE SEEN   Trich, Wet Prep NONE SEEN  NONE SEEN   Clue Cells Wet Prep HPF POC NONE SEEN  NONE SEEN   WBC, Wet Prep HPF POC FEW (*) NONE SEEN      ASSESSMENT  1. Irregular menstrual cycle     PLAN Discharge home with advice to keep office apointment next week.   See AVS for patient education.    Medication List    Notice   You have not been prescribed any medications.     Follow-up Information   Follow up with [facility] In 4 days. (Keep your scheduled appointment)    Contact information:   [address] [place] [address] [phone]       Danae Orleans, CNM 04/17/2013 8:13 PM

## 2013-04-17 NOTE — MAU Note (Signed)
This whole month I have been cramping, feels like contractions. Happens throughtout the day, usually worse in morning. Denies bleeding or d/c. LMP 10/2

## 2013-05-26 ENCOUNTER — Emergency Department (HOSPITAL_COMMUNITY)
Admission: EM | Admit: 2013-05-26 | Discharge: 2013-05-26 | Disposition: A | Discharge status: Home / Self Care | Payer: BC Managed Care – PPO | Attending: Emergency Medicine | Admitting: Emergency Medicine

## 2013-05-26 ENCOUNTER — Encounter (HOSPITAL_COMMUNITY): Payer: Self-pay | Admitting: Emergency Medicine

## 2013-05-26 DIAGNOSIS — H6690 Otitis media, unspecified, unspecified ear: Secondary | ICD-10-CM | POA: Insufficient documentation

## 2013-05-26 DIAGNOSIS — J069 Acute upper respiratory infection, unspecified: Secondary | ICD-10-CM

## 2013-05-26 DIAGNOSIS — H6692 Otitis media, unspecified, left ear: Secondary | ICD-10-CM

## 2013-05-26 MED ORDER — AMOXICILLIN 500 MG PO CAPS
500.0000 mg | ORAL_CAPSULE | Freq: Once | ORAL | Status: AC
  Administered 2013-05-26: 500 mg via ORAL
  Filled 2013-05-26 (×2): qty 1

## 2013-05-26 MED ORDER — AMOXICILLIN 500 MG PO CAPS: 500.0000 mg | ORAL_CAPSULE | Freq: Three times a day (TID) | ORAL | Status: DC

## 2013-05-26 NOTE — ED Notes (Signed)
Pt is here with bilateral ear pain and reports left is worse

## 2013-05-26 NOTE — ED Provider Notes (Signed)
CSN: [id number]     Arrival date & time 05/26/13  1009 History   First MD Initiated Contact with Patient 05/26/13 1055     Chief Complaint  Patient presents with  . Otalgia   (Consider location/radiation/quality/duration/timing/severity/associated sxs/prior Treatment) Patient is a 22 y.o. female presenting with ear pain. The history is provided by the patient.  Otalgia Location:  Bilateral Behind ear:  No abnormality Quality:  Aching Severity:  Moderate Onset quality:  Gradual Duration:  3 days Timing:  Intermittent Progression:  Waxing and waning Chronicity:  New Context: not direct blow   Relieved by:  Nothing Worsened by:  Nothing tried Ineffective treatments:  OTC medications Associated symptoms: congestion and rhinorrhea   Associated symptoms: no diarrhea, no ear discharge, no fever, no neck pain, no tinnitus and no vomiting   Risk factors: no chronic ear infection     Past Medical History  Diagnosis Date  . Medical history non-contributory    Past Surgical History  Procedure Laterality Date  . No past surgeries     No family history on file. History  Substance Use Topics  . Smoking status: Never Smoker   . Smokeless tobacco: Never Used  . Alcohol Use: No   OB History   Grav Para Term Preterm Abortions TAB SAB Ect Mult Living   1 1 1       1      Review of Systems  Constitutional: Negative for fever.  HENT: Positive for congestion, ear pain and rhinorrhea. Negative for ear discharge and tinnitus.   Gastrointestinal: Negative for vomiting and diarrhea.  Musculoskeletal: Negative for neck pain.  All other systems reviewed and are negative.    Allergies  Review of patient's allergies indicates no known allergies.  Home Medications   Current Outpatient Rx  Name  Route  Sig  Dispense  Refill  . acetaminophen (TYLENOL) 160 MG/5ML liquid   Oral   Take 320 mg by mouth every 4 (four) hours as needed for fever.         Marland Kitchen amoxicillin (AMOXIL) 500 MG  capsule   Oral   Take 1 capsule (500 mg total) by mouth 3 (three) times daily.   30 capsule   0    BP 136/66  Pulse 97  Temp(Src) 99 F (37.2 C) (Oral)  Resp 18  Wt 147 lb (66.679 kg)  SpO2 95%  LMP 05/09/2013 Physical Exam  Nursing note and vitals reviewed. Constitutional: She is oriented to person, place, and time. She appears well-developed and well-nourished.  HENT:  Head: Normocephalic.  Right Ear: External ear normal.  Left Ear: External ear normal.  Nose: Nose normal.  Mouth/Throat: Oropharynx is clear and moist.  Left tympanic membrane bulging and erythematous no mastoid tenderness  Eyes: EOM are normal. Pupils are equal, round, and reactive to light. Right eye exhibits no discharge. Left eye exhibits no discharge.  Neck: Normal range of motion. Neck supple. No tracheal deviation present.  No nuchal rigidity no meningeal signs  Cardiovascular: Normal rate and regular rhythm.   Pulmonary/Chest: Effort normal and breath sounds normal. No stridor. No respiratory distress. She has no wheezes. She has no rales.  Abdominal: Soft. She exhibits no distension and no mass. There is no tenderness. There is no rebound and no guarding.  Musculoskeletal: Normal range of motion. She exhibits no edema and no tenderness.  Neurological: She is alert and oriented to person, place, and time. She has normal reflexes. No cranial nerve deficit. Coordination normal.  Skin:  Skin is warm. No rash noted. She is not diaphoretic. No erythema. No pallor.  No pettechia no purpura    ED Course  Procedures (including critical care time) Labs Review Labs Reviewed - No data to display Imaging Review No results found.  EKG Interpretation   None       MDM   1. Otitis media, left   2. URI (upper respiratory infection)    Acute otitis media noted on exam will start on amoxicillin and discharge home. No evidence of trauma or acute otitis externa noted on exam. No mastoid tenderness to suggest  mastoiditis. No nuchal rigidity or toxicity to suggest meningitis, no hypoxia to suggest pneumonia. Patient agrees with plan.    Arley Phenix, MD 05/26/13 [phone]

## 2013-06-04 ENCOUNTER — Ambulatory Visit (INDEPENDENT_AMBULATORY_CARE_PROVIDER_SITE_OTHER): Payer: BC Managed Care – PPO | Admitting: Obstetrics & Gynecology

## 2013-06-04 ENCOUNTER — Encounter: Payer: Self-pay | Admitting: Obstetrics & Gynecology

## 2013-06-04 VITALS — BP 129/78 | HR 80 | Temp 98.1°F | Ht 60.0 in | Wt 143.0 lb

## 2013-06-04 DIAGNOSIS — N912 Amenorrhea, unspecified: Secondary | ICD-10-CM

## 2013-06-04 DIAGNOSIS — N949 Unspecified condition associated with female genital organs and menstrual cycle: Secondary | ICD-10-CM

## 2013-06-04 DIAGNOSIS — Z113 Encounter for screening for infections with a predominantly sexual mode of transmission: Secondary | ICD-10-CM

## 2013-06-04 LAB — POCT URINE PREGNANCY: PREG TEST UR: NEGATIVE

## 2013-06-04 MED ORDER — MEDROXYPROGESTERONE ACETATE 150 MG/ML IM SUSP: 150.0000 mg | INTRAMUSCULAR | Status: DC

## 2013-06-04 NOTE — Progress Notes (Signed)
Subjective:     Lisa Rocaierra L Miller is a 23 y.o. female here for a routine exam.  Current complaints: in office today for birth control consult, state she missed period last month.  Personal health questionnaire reviewed: yes.   Gynecologic History Patient's last menstrual period was 04/13/2013. Contraception: none Last Pap: 10/01/2012. Results were: normal Last mammogram: N/A  Obstetric History OB History  Gravida Para Term Preterm AB SAB TAB Ectopic Multiple Living  1 1 1       1     # Outcome Date GA Lbr Len/2nd Weight Sex Delivery Anes PTL Lv  1 TRM 10/16/10 [id number]   M           The following portions of the patient's history were reviewed and updated as appropriate: allergies, current medications, past family history, past medical history, past social history, past surgical history and problem list.  Review of Systems Pertinent items are noted in HPI.    Objective:      General:  alert     Abdomen: soft, non-tender; bowel sounds normal; no masses,  no organomegaly   Vulva:  normal  Vagina: normal vagina  Cervix:  no lesions  Corpus: normal size, contour, position, consistency, mobility, non-tender  Adnexa:  normal adnexa       Assessment:    Healthy female exam.    Plan:   Orders Placed This Encounter  Procedures  . GC/Chlamydia Probe Amp  . WET PREP BY MOLECULAR PROBE  . POCT urine pregnancy  Return prn

## 2013-06-05 LAB — WET PREP BY MOLECULAR PROBE
Candida species: NEGATIVE
GARDNERELLA VAGINALIS: POSITIVE — AB
Trichomonas vaginosis: NEGATIVE

## 2013-06-05 LAB — GC/CHLAMYDIA PROBE AMP
CT Probe RNA: NEGATIVE
GC Probe RNA: NEGATIVE

## 2013-06-08 ENCOUNTER — Other Ambulatory Visit: Payer: Self-pay | Admitting: *Deleted

## 2013-06-08 DIAGNOSIS — N760 Acute vaginitis: Secondary | ICD-10-CM

## 2013-06-08 DIAGNOSIS — B9689 Other specified bacterial agents as the cause of diseases classified elsewhere: Secondary | ICD-10-CM

## 2013-06-08 MED ORDER — METRONIDAZOLE 500 MG PO TABS: 500.0000 mg | ORAL_TABLET | Freq: Two times a day (BID) | ORAL | Status: DC

## 2013-06-18 ENCOUNTER — Other Ambulatory Visit: Payer: BC Managed Care – PPO

## 2013-07-12 ENCOUNTER — Emergency Department (HOSPITAL_COMMUNITY)
Admission: EM | Admit: 2013-07-12 | Discharge: 2013-07-12 | Disposition: A | Discharge status: Home / Self Care | Payer: BC Managed Care – PPO | Attending: Emergency Medicine | Admitting: Emergency Medicine

## 2013-07-12 ENCOUNTER — Encounter (HOSPITAL_COMMUNITY): Payer: Self-pay | Admitting: Emergency Medicine

## 2013-07-12 DIAGNOSIS — R3915 Urgency of urination: Secondary | ICD-10-CM | POA: Insufficient documentation

## 2013-07-12 DIAGNOSIS — R350 Frequency of micturition: Secondary | ICD-10-CM | POA: Insufficient documentation

## 2013-07-12 DIAGNOSIS — N390 Urinary tract infection, site not specified: Secondary | ICD-10-CM | POA: Insufficient documentation

## 2013-07-12 DIAGNOSIS — R300 Dysuria: Secondary | ICD-10-CM | POA: Insufficient documentation

## 2013-07-12 DIAGNOSIS — Z789 Other specified health status: Secondary | ICD-10-CM | POA: Insufficient documentation

## 2013-07-12 DIAGNOSIS — N898 Other specified noninflammatory disorders of vagina: Secondary | ICD-10-CM | POA: Insufficient documentation

## 2013-07-12 LAB — URINALYSIS, ROUTINE W REFLEX MICROSCOPIC
Bilirubin Urine: NEGATIVE
Glucose, UA: NEGATIVE mg/dL
Hgb urine dipstick: NEGATIVE
Ketones, ur: NEGATIVE mg/dL
NITRITE: POSITIVE — AB
Protein, ur: NEGATIVE mg/dL
SPECIFIC GRAVITY, URINE: 1.027 (ref 1.005–1.030)
UROBILINOGEN UA: 1 mg/dL (ref 0.0–1.0)
pH: 7 (ref 5.0–8.0)

## 2013-07-12 LAB — WET PREP, GENITAL
Clue Cells Wet Prep HPF POC: NONE SEEN
Trich, Wet Prep: NONE SEEN
YEAST WET PREP: NONE SEEN

## 2013-07-12 LAB — URINE MICROSCOPIC-ADD ON

## 2013-07-12 LAB — POCT PREGNANCY, URINE: PREG TEST UR: NEGATIVE

## 2013-07-12 MED ORDER — CEPHALEXIN 500 MG PO CAPS: 500.0000 mg | ORAL_CAPSULE | Freq: Four times a day (QID) | ORAL | Status: DC

## 2013-07-12 NOTE — ED Notes (Signed)
Pelvic cart set up and ready in room.

## 2013-07-12 NOTE — ED Provider Notes (Signed)
CSN: [id number]     Arrival date & time 07/12/13  1651 History   First MD Initiated Contact with Patient 07/12/13 1717     Chief Complaint  Patient presents with  . Abdominal Pain  . Urinary Tract Infection     (Consider location/radiation/quality/duration/timing/severity/associated sxs/prior Treatment) HPI Comments: Patient is a 23 year old female who presents to the emergency department complaining of intermittent right-sided abdominal pain x2-3 weeks. She saw her primary care physician for this, was told she had an ovarian cyst and it would go away, however pain has remained. Pain intermittent, nothing in specific makes it come or go. Admits to associated increased urinary frequency, dysuria and an odor with urination. Denies vaginal discharge or bleeding. She is sexually active with one partner and uses protection. Denies fever, chills, nausea, vomiting or diarrhea.  Patient is a 23 y.o. female presenting with abdominal pain and urinary tract infection. The history is provided by the patient.  Abdominal Pain Associated symptoms: dysuria   Urinary Tract Infection Associated symptoms include abdominal pain.    Past Medical History  Diagnosis Date  . Medical history non-contributory    Past Surgical History  Procedure Laterality Date  . No past surgeries     No family history on file. History  Substance Use Topics  . Smoking status: Never Smoker   . Smokeless tobacco: Never Used  . Alcohol Use: No   OB History   Grav Para Term Preterm Abortions TAB SAB Ect Mult Living   1 1 1       1      Review of Systems  Gastrointestinal: Positive for abdominal pain.  Genitourinary: Positive for dysuria, urgency and frequency.  All other systems reviewed and are negative.      Allergies  Review of patient's allergies indicates no known allergies.  Home Medications   Current Outpatient Rx  Name  Route  Sig  Dispense  Refill  . metroNIDAZOLE (FLAGYL) 500 MG tablet   Oral  Take 1 tablet (500 mg total) by mouth 2 (two) times daily.   14 tablet   0    BP 139/76  Pulse 92  Temp(Src) 99.3 F (37.4 C) (Oral)  Resp 18  Ht 5' (1.524 m)  Wt 142 lb (64.411 kg)  BMI 27.73 kg/m2  SpO2 96%  LMP 06/03/2013 Physical Exam  Nursing note and vitals reviewed. Constitutional: She is oriented to person, place, and time. She appears well-developed and well-nourished. No distress.  HENT:  Head: Normocephalic and atraumatic.  Mouth/Throat: Oropharynx is clear and moist.  Eyes: Conjunctivae are normal.  Neck: Normal range of motion. Neck supple.  Cardiovascular: Normal rate, regular rhythm and normal heart sounds.   Pulmonary/Chest: Effort normal and breath sounds normal.  Abdominal: Soft. Bowel sounds are normal. She exhibits no mass. There is tenderness. There is no rigidity, no rebound and no guarding.    No peritoneal signs.  Genitourinary: Cervix exhibits no motion tenderness, no discharge and no friability. Right adnexum displays tenderness (mild). Right adnexum displays no mass and no fullness. Left adnexum displays no mass, no tenderness and no fullness. No tenderness or bleeding around the vagina. Vaginal discharge (scant, white) found.  Musculoskeletal: Normal range of motion. She exhibits no edema.  Neurological: She is alert and oriented to person, place, and time.  Skin: Skin is warm and dry. She is not diaphoretic.  Psychiatric: She has a normal mood and affect. Her behavior is normal.    ED Course  Procedures (including critical care  time) Labs Review Labs Reviewed  URINALYSIS, ROUTINE W REFLEX MICROSCOPIC   Imaging Review No results found.  EKG Interpretation   None       MDM   Final diagnoses:  None   Patient presenting with lower abdominal pain, increased urinary frequency, dysuria and odor. Urinalysis pending. Will do pelvic exam. 7:08 PM Urinalysis showing many squamous cells, however nitrite positive with many bacteria this  leukocytes, 3-6 white blood cells. Will treat with Keflex. Wet prep without infection. Stable for d/c. Return precautions given. Patient states understanding of treatment care plan and is agreeable.   Trevor Mace, PA-C 07/12/13 1909

## 2013-07-12 NOTE — ED Notes (Signed)
Pt c/o abdominal pain with odor and burning with urination for 2-3 weeks.

## 2013-07-12 NOTE — Discharge Instructions (Signed)
Take antibiotic to completion for your urinary tract infection. ° °Urinary Tract Infection °Urinary tract infections (UTIs) can develop anywhere along your urinary tract. Your urinary tract is your body's drainage system for removing wastes and extra water. Your urinary tract includes two kidneys, two ureters, a bladder, and a urethra. Your kidneys are a pair of bean-shaped organs. Each kidney is about the size of your fist. They are located below your ribs, one on each side of your spine. °CAUSES °Infections are caused by microbes, which are microscopic organisms, including fungi, viruses, and bacteria. These organisms are so small that they can only be seen through a microscope. Bacteria are the microbes that most commonly cause UTIs. °SYMPTOMS  °Symptoms of UTIs may vary by age and gender of the patient and by the location of the infection. Symptoms in young women typically include a frequent and intense urge to urinate and a painful, burning feeling in the bladder or urethra during urination. Older women and men are more likely to be tired, shaky, and weak and have muscle aches and abdominal pain. A fever may mean the infection is in your kidneys. Other symptoms of a kidney infection include pain in your back or sides below the ribs, nausea, and vomiting. °DIAGNOSIS °To diagnose a UTI, your caregiver will ask you about your symptoms. Your caregiver also will ask to provide a urine sample. The urine sample will be tested for bacteria and white blood cells. White blood cells are made by your body to help fight infection. °TREATMENT  °Typically, UTIs can be treated with medication. Because most UTIs are caused by a bacterial infection, they usually can be treated with the use of antibiotics. The choice of antibiotic and length of treatment depend on your symptoms and the type of bacteria causing your infection. °HOME CARE INSTRUCTIONS °· If you were prescribed antibiotics, take them exactly as your caregiver  instructs you. Finish the medication even if you feel better after you have only taken some of the medication. °· Drink enough water and fluids to keep your urine clear or pale yellow. °· Avoid caffeine, tea, and carbonated beverages. They tend to irritate your bladder. °· Empty your bladder often. Avoid holding urine for long periods of time. °· Empty your bladder before and after sexual intercourse. °· After a bowel movement, women should cleanse from front to back. Use each tissue only once. °SEEK MEDICAL CARE IF:  °· You have back pain. °· You develop a fever. °· Your symptoms do not begin to resolve within 3 days. °SEEK IMMEDIATE MEDICAL CARE IF:  °· You have severe back pain or lower abdominal pain. °· You develop chills. °· You have nausea or vomiting. °· You have continued burning or discomfort with urination. °MAKE SURE YOU:  °· Understand these instructions. °· Will watch your condition. °· Will get help right away if you are not doing well or get worse. °Document Released: 02/21/2005 Document Revised: 11/13/2011 Document Reviewed: 06/22/2011 °[employer]® Patient Information ©2014 [employer]. ° °

## 2013-07-13 LAB — GC/CHLAMYDIA PROBE AMP
CT PROBE, AMP APTIMA: NEGATIVE
GC PROBE AMP APTIMA: NEGATIVE

## 2013-07-13 NOTE — ED Provider Notes (Signed)
Medical screening examination/treatment/procedure(s) were performed by non-physician practitioner and as supervising physician I was immediately available for consultation/collaboration.  EKG Interpretation   None         Jaina Morin N Luvinia Lucy, DO 07/13/13 0234 

## 2013-10-01 ENCOUNTER — Encounter: Payer: BC Managed Care – PPO | Admitting: Internal Medicine

## 2013-10-09 ENCOUNTER — Encounter: Payer: Self-pay | Admitting: Internal Medicine

## 2013-10-26 NOTE — Progress Notes (Signed)
This encounter was created in error - please disregard.

## 2013-11-27 ENCOUNTER — Encounter (HOSPITAL_COMMUNITY): Payer: Self-pay | Admitting: Emergency Medicine

## 2013-11-27 ENCOUNTER — Emergency Department (HOSPITAL_COMMUNITY): Payer: BC Managed Care – PPO

## 2013-11-27 ENCOUNTER — Emergency Department (HOSPITAL_COMMUNITY)
Admission: EM | Admit: 2013-11-27 | Discharge: 2013-11-27 | Disposition: A | Discharge status: Home / Self Care | Payer: BC Managed Care – PPO | Attending: Emergency Medicine | Admitting: Emergency Medicine

## 2013-11-27 DIAGNOSIS — Z9119 Patient's noncompliance with other medical treatment and regimen: Secondary | ICD-10-CM | POA: Insufficient documentation

## 2013-11-27 DIAGNOSIS — Z79899 Other long term (current) drug therapy: Secondary | ICD-10-CM | POA: Insufficient documentation

## 2013-11-27 DIAGNOSIS — N390 Urinary tract infection, site not specified: Secondary | ICD-10-CM

## 2013-11-27 DIAGNOSIS — R0789 Other chest pain: Secondary | ICD-10-CM

## 2013-11-27 DIAGNOSIS — Z792 Long term (current) use of antibiotics: Secondary | ICD-10-CM | POA: Insufficient documentation

## 2013-11-27 DIAGNOSIS — Z3202 Encounter for pregnancy test, result negative: Secondary | ICD-10-CM | POA: Insufficient documentation

## 2013-11-27 DIAGNOSIS — Z91199 Patient's noncompliance with other medical treatment and regimen due to unspecified reason: Secondary | ICD-10-CM | POA: Insufficient documentation

## 2013-11-27 LAB — CBC
HCT: 42.2 % (ref 36.0–46.0)
Hemoglobin: 14.3 g/dL (ref 12.0–15.0)
MCH: 29.9 pg (ref 26.0–34.0)
MCHC: 33.9 g/dL (ref 30.0–36.0)
MCV: 88.1 fL (ref 78.0–100.0)
PLATELETS: 313 10*3/uL (ref 150–400)
RBC: 4.79 MIL/uL (ref 3.87–5.11)
RDW: 12.4 % (ref 11.5–15.5)
WBC: 11.8 10*3/uL — ABNORMAL HIGH (ref 4.0–10.5)

## 2013-11-27 LAB — URINALYSIS, ROUTINE W REFLEX MICROSCOPIC
Bilirubin Urine: NEGATIVE
Glucose, UA: NEGATIVE mg/dL
Hgb urine dipstick: NEGATIVE
Ketones, ur: NEGATIVE mg/dL
Nitrite: POSITIVE — AB
PROTEIN: NEGATIVE mg/dL
SPECIFIC GRAVITY, URINE: 1.02 (ref 1.005–1.030)
UROBILINOGEN UA: 0.2 mg/dL (ref 0.0–1.0)
pH: 6 (ref 5.0–8.0)

## 2013-11-27 LAB — WET PREP, GENITAL
Trich, Wet Prep: NONE SEEN
Yeast Wet Prep HPF POC: NONE SEEN

## 2013-11-27 LAB — POC URINE PREG, ED: Preg Test, Ur: NEGATIVE

## 2013-11-27 LAB — URINE MICROSCOPIC-ADD ON

## 2013-11-27 LAB — BASIC METABOLIC PANEL
ANION GAP: 12 (ref 5–15)
BUN: 12 mg/dL (ref 6–23)
CALCIUM: 9.2 mg/dL (ref 8.4–10.5)
CO2: 25 mEq/L (ref 19–32)
CREATININE: 0.88 mg/dL (ref 0.50–1.10)
Chloride: 104 mEq/L (ref 96–112)
GFR calc non Af Amer: >90 mL/min (ref >90)
Glucose, Bld: 104 mg/dL — ABNORMAL HIGH (ref 70–99)
Potassium: 4.5 mEq/L (ref 3.7–5.3)
SODIUM: 141 meq/L (ref 137–147)

## 2013-11-27 LAB — I-STAT TROPONIN, ED: TROPONIN I, POC: 0 ng/mL (ref 0.00–0.08)

## 2013-11-27 MED ORDER — CEPHALEXIN 500 MG PO CAPS: 500.0000 mg | ORAL_CAPSULE | Freq: Four times a day (QID) | ORAL | Status: DC

## 2013-11-27 NOTE — ED Notes (Signed)
Per pt, states lower abdominal pain for 3 days-states left sided chest pain on and off for 3 years

## 2013-11-27 NOTE — Discharge Instructions (Signed)
1. Medications: keflex, usual home medications 2. Treatment: rest, drink plenty of fluids,  3. Follow Up: Please followup with your primary doctor for discussion of your diagnoses and further evaluation after today's visit; if you do not have a primary care doctor use the resource guide provided to find one;    Urinary Tract Infection Urinary tract infections (UTIs) can develop anywhere along your urinary tract. Your urinary tract is your body's drainage system for removing wastes and extra water. Your urinary tract includes two kidneys, two ureters, a bladder, and a urethra. Your kidneys are a pair of bean-shaped organs. Each kidney is about the size of your fist. They are located below your ribs, one on each side of your spine. CAUSES Infections are caused by microbes, which are microscopic organisms, including fungi, viruses, and bacteria. These organisms are so small that they can only be seen through a microscope. Bacteria are the microbes that most commonly cause UTIs. SYMPTOMS  Symptoms of UTIs may vary by age and gender of the patient and by the location of the infection. Symptoms in young women typically include a frequent and intense urge to urinate and a painful, burning feeling in the bladder or urethra during urination. Older women and men are more likely to be tired, shaky, and weak and have muscle aches and abdominal pain. A fever may mean the infection is in your kidneys. Other symptoms of a kidney infection include pain in your back or sides below the ribs, nausea, and vomiting. DIAGNOSIS To diagnose a UTI, your caregiver will ask you about your symptoms. Your caregiver also will ask to provide a urine sample. The urine sample will be tested for bacteria and white blood cells. White blood cells are made by your body to help fight infection. TREATMENT  Typically, UTIs can be treated with medication. Because most UTIs are caused by a bacterial infection, they usually can be treated with  the use of antibiotics. The choice of antibiotic and length of treatment depend on your symptoms and the type of bacteria causing your infection. HOME CARE INSTRUCTIONS  If you were prescribed antibiotics, take them exactly as your caregiver instructs you. Finish the medication even if you feel better after you have only taken some of the medication.  Drink enough water and fluids to keep your urine clear or pale yellow.  Avoid caffeine, tea, and carbonated beverages. They tend to irritate your bladder.  Empty your bladder often. Avoid holding urine for long periods of time.  Empty your bladder before and after sexual intercourse.  After a bowel movement, women should cleanse from front to back. Use each tissue only once. SEEK MEDICAL CARE IF:   You have back pain.  You develop a fever.  Your symptoms do not begin to resolve within 3 days. SEEK IMMEDIATE MEDICAL CARE IF:   You have severe back pain or lower abdominal pain.  You develop chills.  You have nausea or vomiting.  You have continued burning or discomfort with urination. MAKE SURE YOU:   Understand these instructions.  Will watch your condition.  Will get help right away if you are not doing well or get worse. Document Released: 02/21/2005 Document Revised: 11/13/2011 Document Reviewed: 06/22/2011 [facility] Patient Information 2015 [place], [place]. This information is not intended to replace advice given to you by your health care provider. Make sure you discuss any questions you have with your health care provider.    Emergency Department Resource Guide 1) Find a Doctor and Pay  Out of Pocket Although you won't have to find out who is covered by your insurance plan, it is a good idea to ask around and get recommendations. You will then need to call the office and see if the doctor you have chosen will accept you as a new patient and what types of options they offer for patients who are self-pay. Some doctors offer  discounts or will set up payment plans for their patients who do not have insurance, but you will need to ask so you aren't surprised when you get to your appointment.  2) Contact Your Local Health Department Not all health departments have doctors that can see patients for sick visits, but many do, so it is worth a call to see if yours does. If you don't know where your local health department is, you can check in your phone book. The CDC also has a tool to help you locate your state's health department, and many state websites also have listings of all of their local health departments.  3) Find a Walk-in Clinic If your illness is not likely to be very severe or complicated, you may want to try a walk in clinic. These are popping up all over the country in pharmacies, drugstores, and shopping centers. They're usually staffed by nurse practitioners or physician assistants that have been trained to treat common illnesses and complaints. They're usually fairly quick and inexpensive. However, if you have serious medical issues or chronic medical problems, these are probably not your best option.  No Primary Care Doctor: - Call Health Connect at  [phone] - they can help you locate a primary care doctor that  accepts your insurance, provides certain services, etc. - Physician Referral Service- [phone]  Chronic Pain Problems: Organization         Address  Phone   Notes  [facility]  [phone] Patients need to be referred by their primary care doctor.   Medication Assistance: Organization         Address  Phone   Notes  [facility] Medication [facility] [address] [place], [place] [address] [phone] --Must be a resident of [place] -- Must have NO insurance coverage whatsoever (no Medicaid/ Medicare, etc.) -- The pt. MUST have a primary care doctor that directs their care regularly and follows them in the community   MedAssist   [phone]   [employer]  [phone]    Agencies that provide inexpensive medical care: Organization         Address  Phone   Notes  [facility]  [phone]   [facility]    [phone]   [facility] [address] [place], [place] [address] [phone]   [facility] [address], [place] [phone]   Planned Parenthood    [phone]) [phone]   [facility]    [phone]   Community Health and [facility]  [address], [place] Phone:  [phone], Fax:  [phone] Hours of Operation:  9 am - 6 pm, M-F.  Also accepts Medicaid/Medicare and self-pay.  [facility]  [address], [place] Phone: [phone], Fax: [phone]) [phone]. Hours of Operation:  8:30 am - 5:30 pm, M-F.  Also accepts Medicaid and self-pay.  [facility] [place] [address], [place] Phone: [phone]) [phone]  [facility] [address], [place], [place] [phone], Ext. [phone] Mondays & Thursdays: 7-9 AM.  First 15 patients are seen on a first come, first serve basis.    Medicaid-accepting [facility] [place] Providers:  Organization         Address  Phone   Notes  [facility] [address], [place] [phone] Also accepts self-pay patients.  [facility] [address], [place]  [phone]) [phone]   [facility] [address], [place] [phone]) [phone]   [facility] [address] [place] [address], [place] [phone]) [phone]   Renaye RakersVeita Bland [address], [place]   [phone] Only accepts [place] Access [place] patients after they have their name applied to their card.   Self-Pay (no insurance) in [facility] [place] [facility]:  Organization         Address  Phone   Notes  Sickle Cell Patients, [facility] [address], [place] [phone]   [facility] [address], [place] [phone]   [facility] [place]  [address],  [phone]) [phone]   [facility]/Dr. Osei-Bonsu  [address] [place] [address], [place] or [phone], [place] [phone] Phone number for both [place] and [place] locations is the same.  [facility]gent [facility] and [facility] [address], [place] [phone]   [facility] [place] [address] [place] [address], [place] or [address] [phone]) [phone]) [phone]   [facility] [address], [place] [phone], phone; [phone], fax Sees patients 1st and 3rd Saturday of every month.  Must not qualify for public or private insurance (i.e. Medicaid, Medicare, [place] Health Choice, Veterans' Benefits)  Household income should be no more than 200% of the poverty level The clinic cannot treat you if you are pregnant or think you are pregnant  Sexually transmitted diseases are not treated at the clinic.    Dental Care: Organization         Address  Phone  Notes  [facility] Department of [facility] [address], [place] [phone] Accepts children up to age 621 who are enrolled in [place]icaid or [place] Health Choice; pregnant women with a Medicaid card; and children who have applied for Medicaid or [place] Health Choice, but were declined, whose parents can pay a reduced fee at time of service.  [facility] [place] [facility] of [facility] [place]  [address], [place] [phone]) [phone] Accepts children up to age 22 who are enrolled in [place]icaid or [place] Health Choice; pregnant women with a Medicaid card; and children who have applied for Medicaid or [place] Health Choice, but were declined, whose parents can pay a reduced fee at time of service.  [facility]  [address], [place]  [phone] Patients are seen by appointment only. Walk-ins are not accepted. [facility] will see patients 22 years of age and older. Monday - Tuesday (8am-5pm) Most Wednesdays (8:30-5pm) $30 per visit, cash only  [facility]  [address], [facility] [phone] Patients are seen by appointment only. Walk-ins are not accepted. [facility] will see patients 22 years of age and older. One Wednesday Evening (Monthly: Volunteer Based).  $30 per visit,  cash only  [employer] [facility]  [phone] for adults; Children under age 864, call Graduate Pediatric Dentistry at [phone]. Children aged 404-14, please call [phone]) [phone] to request a pediatric application.  Dental services are provided in all areas of dental care including fillings, crowns and bridges, complete and partial dentures, implants, gum treatment, root canals, and extractions. Preventive care is also provided. Treatment is provided to both adults and children. Patients are selected via a lottery and there is often a waiting list.   [facility] [address], [place]  [phone] [email or web address]   [facility] [address], [place], [place] [phone], Ext. [phone] Second and Fourth Thursday of each month, opens at 6:30 AM; Clinic ends at 9 AM.  Patients are seen on a first-come first-served basis, and a limited number are seen during each clinic.   [facility]  [address], [place], [place] [phone]) [phone]   Eligibility Requirements You must have lived in [place], [place], or [place] counties for at least the last three months.   You cannot be eligible for state or federal sponsored National Cityhealthcare insurance, including CIGNAVeterans Administration, [place]Medicaid, or Har[employer] EntertainmentMedicare.   You generally cannot be eligible for healthcare insurance through your employer.    How to apply: Eligibility screenings are held every Tuesday and Wednesday  afternoon from 1:00 pm until 4:00 pm. You do not need an appointment for the interview!  [facility] [address], [place], [place] [phone]   [facility]  [phone]   [facility]  [phone]   [facility] Health [facility]  [phone]    Behavioral Health Resources in the Community: Intensive Outpatient Programs Organization         Address  Phone  Notes  [facility] [address], [place], [place] [phone]   [facility] [address], [place], [place] [phone]   [facility]: [facility] Drug S[facility] [address], [place], [place]  [phone]   [facility] [address],  [place], [place] [phone] or [phone]   Substance Abuse Resources Organization         Address  Phone  Notes  [facility] Drug Services  [phone]   [facility]  [phone]   [facility]  [phone]   [facility]  [phone]   Residential & Outpatient Substance Abuse Program  [phone]   Psychological Services Organization         Address  Phone  Notes  [facility]  [phone]- [phone]   [facility]  [phone]   [facility] [address], [place] [phone] or [phone]    Mobile Crisis Teams Organization         Address  Phone  Notes  [facility], Mobile Crisis Care Unit  [phone]   [facility]  [address] [place], [place] [phone]   Doristine LocksSharon DeEsch [address] [place] [phone]    Self-Help/Support Groups Organization         Address  Phone             Notes  [facility] [place] - variety of support groups  336- [phone] Call for more information  Narcotics Anonymous (NA), [facility] [address], [place]  2 meetings at this location   Nutritional therapistesidential Treatment  Programs Organization  Address  Phone  Notes  [facility] [address],    [place]  [phone]   [facility]  [address] [place] [id number], [place], [place]   [place], [place] [phone] Admissions: 8am-3pm M-F  Incentives Substance [place] [address],    [place], [place] [phone]   [facility] [address] [place], [place], [place]   [facility] [address]  [place], [place]   Insight Programs - Intensive Outpatient [address], [place], [place]   [facility] ([address] [place].,  [place], [place] [phone] or [phone]   [facility] Treatment Services (RTS) [address], [place], [place] Accepts Medicaid  [facility] [address]  [place] [phone] Substance Abuse/Addiction Treatment   [facility] Organization         Address  Phone  Notes  [facility]  [phone]   Domenic Schwab, PhD [address]lis Port[place], [place]   [phone] or [phone]   Finley Quincy [place], [place] [phone]   [facility] [address] [place], [place] [phone] Insurance/Medicaid/sponsorship through [facility] and Families [address] [place]                                    [place], [place] [phone] [place] [address] Street[place], [place] [phone]    Dr. Adele Schilder  [phone]   [facility] Dept. 1) [address], [place] 2) [place] 3)  [place] [address], [place] [phone]  [phone]   [place] [phone] or [phone] (After Hours)

## 2013-11-27 NOTE — ED Notes (Signed)
PA at bedside.

## 2013-11-27 NOTE — ED Provider Notes (Signed)
CSN: [id number]     Arrival date & time 11/27/13  1711 History   First MD Initiated Contact with Patient 11/27/13 1801     Chief Complaint  Patient presents with  . Abdominal Pain  . Chest Pain     (Consider location/radiation/quality/duration/timing/severity/associated sxs/prior Treatment) The history is provided by the patient and medical records. No language interpreter was used.    Kayla [name] is a 23 y.o. female  with no known medical Hx presents to the Emergency Department with multiple complaints complaining of gradual, persistent, progressively worsening lower abd pain described as cramping onset 3 days ago.  Pt reports no associated symptoms.  LMP: Oct 22, 2013, but she reports she is on depo-provera and has irregular bleeding. Denies new sexual partners, recent hx of STD, vaginal discharge or dysuria.  Pt reports she has not taken any OTC medications for this and nothing makes it better or worse.    Pt also c/o CP, sharp/stabbing in nature, intermittent for the last 3 years.  Pain occurs in random places throughout the anterior chest approx 1 time per week without aggravating factors and resolves spontaneously within 30-35 min.  Pt denies previous work-up for this and denies family hx of sudden cardiac death.  Pt also denies exogenous estrogen, recent travel, swelling in the legs, SOB or associated N/V, diaphoresis, near syncope.    Past Medical History  Diagnosis Date  . Medical history non-contributory    Past Surgical History  Procedure Laterality Date  . No past surgeries     No family history on file. History  Substance Use Topics  . Smoking status: Never Smoker   . Smokeless tobacco: Never Used  . Alcohol Use: No   OB History   Grav Para Term Preterm Abortions TAB SAB Ect Mult Living   1 1 1       1      Review of Systems  Constitutional: Negative for fever, diaphoresis, appetite change, fatigue and unexpected weight change.  HENT: Negative for mouth sores  and trouble swallowing.   Eyes: Negative for visual disturbance.  Respiratory: Negative for cough, chest tightness, shortness of breath, wheezing and stridor.   Cardiovascular: Positive for chest pain (intermittent x3 years). Negative for palpitations.  Gastrointestinal: Positive for abdominal pain (lower, cramping). Negative for nausea, vomiting, diarrhea, constipation, blood in stool, abdominal distention and rectal pain.  Endocrine: Negative for polydipsia, polyphagia and polyuria.  Genitourinary: Negative for dysuria, urgency, frequency, hematuria, flank pain and difficulty urinating.  Musculoskeletal: Negative for back pain, neck pain and neck stiffness.  Skin: Negative for rash.  Allergic/Immunologic: Negative for immunocompromised state.  Neurological: Negative for syncope, weakness, light-headedness and headaches.  Hematological: Negative for adenopathy. Does not bruise/bleed easily.  Psychiatric/Behavioral: Negative for confusion and sleep disturbance. The patient is not nervous/anxious.   All other systems reviewed and are negative.     Allergies  Review of patient's allergies indicates no known allergies.  Home Medications   Prior to Admission medications   Medication Sig Start Date End Date Taking? Authorizing Provider  medroxyPROGESTERone (DEPO-PROVERA) 150 MG/ML injection Inject 150 mg into the muscle every 3 (three) months.   Yes Historical Provider, MD  cephALEXin (KEFLEX) 500 MG capsule Take 1 capsule (500 mg total) by mouth 4 (four) times daily. 11/27/13   Cambridge Deleo, PA-C   BP 138/80  Pulse 92  Temp(Src) 98.1 F (36.7 C) (Oral)  Resp 16  SpO2 100%  LMP 10/21/2013 Physical Exam  Nursing note and vitals  reviewed. Constitutional: She appears well-developed and well-nourished. No distress.  Awake, alert, nontoxic appearance  HENT:  Head: Normocephalic and atraumatic.  Mouth/Throat: Oropharynx is clear and moist. No oropharyngeal exudate.  Eyes:  Conjunctivae are normal. No scleral icterus.  Neck: Normal range of motion. Neck supple.  Cardiovascular: Normal rate, regular rhythm, normal heart sounds and intact distal pulses.   No murmur heard. Pulmonary/Chest: Effort normal and breath sounds normal. No respiratory distress. She has no wheezes.  Clear and equal breath sounds  Abdominal: Soft. Bowel sounds are normal. She exhibits no mass. There is no hepatosplenomegaly. There is tenderness in the suprapubic area. There is no rebound, no guarding and no CVA tenderness. Hernia confirmed negative in the right inguinal area and confirmed negative in the left inguinal area.  Abd soft with mild TTP in the suprapubic region without guarding, rebound or peritoneal signs No CVA tenderness  Genitourinary: Uterus normal. Pelvic exam was performed with patient supine. There is no rash, tenderness or lesion on the right labia. There is no rash, tenderness or lesion on the left labia. Uterus is not deviated, not enlarged, not fixed and not tender. Cervix exhibits no motion tenderness, no discharge and no friability. Right adnexum displays no mass, no tenderness and no fullness. Left adnexum displays tenderness. Left adnexum displays no mass and no fullness. No erythema, tenderness or bleeding around the vagina. No foreign body around the vagina. No signs of injury around the vagina. Vaginal discharge (scant, white, thick) found.  L adnexal tenderness without palpable fullness or mass on exam.    Musculoskeletal: Normal range of motion. She exhibits no edema.  Lymphadenopathy:    She has no cervical adenopathy.       Right: No inguinal adenopathy present.       Left: No inguinal adenopathy present.  Neurological: She is alert.  Speech is clear and goal oriented Moves extremities without ataxia  Skin: Skin is warm and dry. No rash noted. She is not diaphoretic.  Psychiatric: She has a normal mood and affect.    ED Course  Procedures (including critical  care time) Labs Review Labs Reviewed  WET PREP, GENITAL - Abnormal; Notable for the following:    Clue Cells Wet Prep HPF POC MANY (*)    WBC, Wet Prep HPF POC FEW (*)    All other components within normal limits  CBC - Abnormal; Notable for the following:    WBC 11.8 (*)    All other components within normal limits  BASIC METABOLIC PANEL - Abnormal; Notable for the following:    Glucose, Bld 104 (*)    All other components within normal limits  URINALYSIS, ROUTINE W REFLEX MICROSCOPIC - Abnormal; Notable for the following:    APPearance CLOUDY (*)    Nitrite POSITIVE (*)    Leukocytes, UA SMALL (*)    All other components within normal limits  URINE MICROSCOPIC-ADD ON - Abnormal; Notable for the following:    Squamous Epithelial / LPF MANY (*)    Bacteria, UA MANY (*)    All other components within normal limits  GC/CHLAMYDIA PROBE AMP  I-STAT TROPOININ, ED  POC URINE PREG, ED    Imaging Review Dg Chest 2 View  11/27/2013   CLINICAL DATA:  Left chest pain.  Lower abdominal pain.  EXAM: CHEST  2 VIEW  COMPARISON:  03/11/2010.  FINDINGS: The heart size and mediastinal contours are within normal limits. Both lungs are clear. The visualized skeletal structures are unremarkable.  IMPRESSION:  Normal examination, unchanged.   Electronically Signed   By: Gordan Payment M.D.   On: 11/27/2013 17:58   US Transvaginal Non-ob  11/27/2013   CLINICAL DATA:  Abdominal pain. Left adnexal pain. Rule out torsion.  EXAM: TRANSABDOMINAL AND TRANSVAGINAL ULTRASOUND OF PELVIS  DOPPLER ULTRASOUND OF OVARIES  TECHNIQUE: Both transabdominal and transvaginal ultrasound examinations of the pelvis were performed. Transabdominal technique was performed for global imaging of the pelvis including uterus, ovaries, adnexal regions, and pelvic cul-de-sac.  It was necessary to proceed with endovaginal exam following the transabdominal exam to visualize the ovaries. Color and duplex Doppler ultrasound was utilized to  evaluate blood flow to the ovaries.  COMPARISON:  None.  FINDINGS: Uterus  Measurements: 7.1 x 3.4 x 5.1 cm. No fibroids or other mass visualized.  Endometrium  Thickness: 3 mm.  No focal abnormality visualized.  Right ovary  Measurements: 4.1 x 2.7 x 2.3 cm. Normal appearance/no adnexal mass.  Left ovary  Measurements: 4 x 2 x 2.5 cm. Normal appearance/no adnexal mass.  Pulsed Doppler evaluation of both ovaries demonstrates normal low-resistance arterial and venous waveforms.  Other findings:  No significant free pelvic fluid.  IMPRESSION: Normal pelvic ultrasound.  No evidence for ovarian torsion.   Electronically Signed   By: Tiburcio Pea M.D.   On: 11/27/2013 21:10   US Pelvis Complete  11/27/2013   CLINICAL DATA:  Abdominal pain. Left adnexal pain. Rule out torsion.  EXAM: TRANSABDOMINAL AND TRANSVAGINAL ULTRASOUND OF PELVIS  DOPPLER ULTRASOUND OF OVARIES  TECHNIQUE: Both transabdominal and transvaginal ultrasound examinations of the pelvis were performed. Transabdominal technique was performed for global imaging of the pelvis including uterus, ovaries, adnexal regions, and pelvic cul-de-sac.  It was necessary to proceed with endovaginal exam following the transabdominal exam to visualize the ovaries. Color and duplex Doppler ultrasound was utilized to evaluate blood flow to the ovaries.  COMPARISON:  None.  FINDINGS: Uterus  Measurements: 7.1 x 3.4 x 5.1 cm. No fibroids or other mass visualized.  Endometrium  Thickness: 3 mm.  No focal abnormality visualized.  Right ovary  Measurements: 4.1 x 2.7 x 2.3 cm. Normal appearance/no adnexal mass.  Left ovary  Measurements: 4 x 2 x 2.5 cm. Normal appearance/no adnexal mass.  Pulsed Doppler evaluation of both ovaries demonstrates normal low-resistance arterial and venous waveforms.  Other findings:  No significant free pelvic fluid.  IMPRESSION: Normal pelvic ultrasound.  No evidence for ovarian torsion.   Electronically Signed   By: Tiburcio Pea M.D.   On:  11/27/2013 21:10   [place] Art/ven Flow Abd Pelv Doppler  11/27/2013   CLINICAL DATA:  Abdominal pain. Left adnexal pain. Rule out torsion.  EXAM: TRANSABDOMINAL AND TRANSVAGINAL ULTRASOUND OF PELVIS  DOPPLER ULTRASOUND OF OVARIES  TECHNIQUE: Both transabdominal and transvaginal ultrasound examinations of the pelvis were performed. Transabdominal technique was performed for global imaging of the pelvis including uterus, ovaries, adnexal regions, and pelvic cul-de-sac.  It was necessary to proceed with endovaginal exam following the transabdominal exam to visualize the ovaries. Color and duplex Doppler ultrasound was utilized to evaluate blood flow to the ovaries.  COMPARISON:  None.  FINDINGS: Uterus  Measurements: 7.1 x 3.4 x 5.1 cm. No fibroids or other mass visualized.  Endometrium  Thickness: 3 mm.  No focal abnormality visualized.  Right ovary  Measurements: 4.1 x 2.7 x 2.3 cm. Normal appearance/no adnexal mass.  Left ovary  Measurements: 4 x 2 x 2.5 cm. Normal appearance/no adnexal mass.  Pulsed Doppler evaluation of  both ovaries demonstrates normal low-resistance arterial and venous waveforms.  Other findings:  No significant free pelvic fluid.  IMPRESSION: Normal pelvic ultrasound.  No evidence for ovarian torsion.   Electronically Signed   By: Tiburcio PeaJonathan  Watts M.D.   On: 11/27/2013 21:10     EKG Interpretation   Date/Time:  Friday November 27 2013 17:21:13 EDT Ventricular Rate:  89 PR Interval:  126 QRS Duration: 84 QT Interval:  360 QTC Calculation: 438 R Axis:   57 Text Interpretation:  Sinus rhythm Borderline T abnormalities, anterior  leads Baseline wander in lead(s) II V1 V2 TWI in lead V3, V4  resolved.   Otherwise unchanged from prior in 8/'14 Confirmed by [facility]  MD, MEGAN  [phone]) on 11/27/2013 6:24:35 PM      MDM   Final diagnoses:  UTI (lower urinary tract infection)  Atypical chest pain   Kayla [name] presents with lower abd cramping without assocaited symptoms and  intermittent CP for the last 3 years that is not reproducible on exam.  Pt is PERC negative; pain is not reproducible on exam, but is very atypical and has been present for the last 3 years.  ECG is nonischemic and she has no cardiac risk factors.  Troponin is negative.  Chest pain is not likely of cardiac or pulmonary etiology d/t presentation, PERC negative, VSS, no tracheal deviation, no JVD or new murmur, RRR, breath sounds equal bilaterally, EKG without acute abnormalities, negative troponin, and negative CXR.   Pt with lower abd pain and left adnexal tenderness on pelvic exam.  Wet prep with clue cells indicative of BV.  US pelvis without acute abnormality and UA with evidence of UTI.  Pt has been diagnosed with a UTI. Pt is afebrile, no CVA tenderness, normotensive, and denies N/V. Pt to be dc home with antibiotics and instructions to follow up with PCP if symptoms persist.  No evidence of ovarian torsion on US.  I have personally reviewed patient's vitals, nursing note and any pertinent labs or imaging.  I performed an undressed physical exam.    At this time, it has been determined that no acute conditions requiring further emergency intervention. The patient/guardian have been advised of the diagnosis and plan. I reviewed all labs and imaging including any potential incidental findings. We have discussed signs and symptoms that warrant return to the ED, such as fever, intractable vomiting.  Patient/guardian has voiced understanding and agreed to follow-up with the PCP or specialist in 3 days.  Vital signs are stable at discharge.   BP 138/80  Pulse 92  Temp(Src) 98.1 F (36.7 C) (Oral)  Resp 16  SpO2 100%  LMP 10/21/2013          Dierdre ForthHannah Annalise Mcdiarmid, PA-C 11/27/13 2232

## 2013-11-28 LAB — GC/CHLAMYDIA PROBE AMP
CT PROBE, AMP APTIMA: NEGATIVE
GC PROBE AMP APTIMA: NEGATIVE

## 2013-11-28 NOTE — ED Provider Notes (Signed)
Medical screening examination/treatment/procedure(s) were performed by non-physician practitioner and as supervising physician I was immediately available for consultation/collaboration.   EKG Interpretation   Date/Time:  Friday November 27 2013 17:21:13 EDT Ventricular Rate:  89 PR Interval:  126 QRS Duration: 84 QT Interval:  360 QTC Calculation: 438 R Axis:   57 Text Interpretation:  Sinus rhythm Borderline T abnormalities, anterior  leads Baseline wander in lead(s) II V1 V2 TWI in lead V3, V4  resolved.   Otherwise unchanged from prior in 8/'14 Confirmed by [facility]  MD, MEGAN  [phone]) on 11/27/2013 6:24:35 PM        Shanna CiscoMegan E Docherty, MD 11/28/13 0000

## 2013-12-03 ENCOUNTER — Encounter: Payer: BC Managed Care – PPO | Admitting: Cardiology

## 2013-12-11 ENCOUNTER — Encounter: Payer: Self-pay | Admitting: Cardiology

## 2014-01-06 ENCOUNTER — Ambulatory Visit (INDEPENDENT_AMBULATORY_CARE_PROVIDER_SITE_OTHER): Payer: BC Managed Care – PPO | Admitting: Physician Assistant

## 2014-01-06 VITALS — BP 110/70 | HR 81 | Temp 98.3°F | Resp 16 | Ht 62.5 in | Wt 141.2 lb

## 2014-01-06 DIAGNOSIS — R05 Cough: Secondary | ICD-10-CM

## 2014-01-06 DIAGNOSIS — R059 Cough, unspecified: Secondary | ICD-10-CM

## 2014-01-06 DIAGNOSIS — J3489 Other specified disorders of nose and nasal sinuses: Secondary | ICD-10-CM

## 2014-01-06 DIAGNOSIS — R0981 Nasal congestion: Secondary | ICD-10-CM

## 2014-01-06 DIAGNOSIS — J029 Acute pharyngitis, unspecified: Secondary | ICD-10-CM

## 2014-01-06 LAB — POCT RAPID STREP A (OFFICE): Rapid Strep A Screen: NEGATIVE

## 2014-01-06 MED ORDER — FIRST-DUKES MOUTHWASH MT SUSP: 5.0000 mL | OROMUCOSAL | Status: DC | PRN

## 2014-01-06 MED ORDER — AMOXICILLIN 875 MG PO TABS: 875.0000 mg | ORAL_TABLET | Freq: Two times a day (BID) | ORAL | Status: DC

## 2014-01-06 NOTE — Progress Notes (Signed)
   Subjective:    Patient ID: Kayla [name], female    DOB: 1990-08-20, 23 y.o.   MRN: [id number]  HPI 23 year old female presents for evaluation of 2 day history of sore throat, painful swallowing, PND, dry cough, and right ear popping and pressure.  She has no known strep exposure or hx of frequent strep infections. Denies fever, hcills, nausea, vomiting, headache, dizziness, SOB, wheezing, or chest pain.  No OTC tx tried.     Review of Systems  Constitutional: Negative for fever and chills.  HENT: Positive for postnasal drip and sore throat. Negative for congestion, rhinorrhea, sinus pressure and trouble swallowing.   Respiratory: Positive for cough. Negative for chest tightness, shortness of breath and wheezing.   Gastrointestinal: Negative for nausea and vomiting.  Neurological: Negative for dizziness and headaches.       Objective:   Physical Exam  Constitutional: She is oriented to person, place, and time. She appears well-developed and well-nourished.  HENT:  Head: Normocephalic and atraumatic.  Right Ear: Hearing, tympanic membrane, external ear and ear canal normal.  Left Ear: Hearing, tympanic membrane, external ear and ear canal normal.  Mouth/Throat: Uvula is midline and mucous membranes are normal. Posterior oropharyngeal erythema present. No oropharyngeal exudate, posterior oropharyngeal edema or tonsillar abscesses.  Eyes: Conjunctivae are normal.  Neck: Normal range of motion. Neck supple.  Cardiovascular: Normal rate, regular rhythm and normal heart sounds.   Pulmonary/Chest: Effort normal and breath sounds normal.  Lymphadenopathy:    She has no cervical adenopathy.  Neurological: She is alert and oriented to person, place, and time.  Psychiatric: She has a normal mood and affect. Her behavior is normal. Judgment and thought content normal.     Results for orders placed in visit on 01/06/14  POCT RAPID STREP A (OFFICE)      Result Value Ref Range   Rapid  Strep A Screen Negative  Negative        Assessment & Plan:  Acute pharyngitis, unspecified pharyngitis type - Plan: POCT rapid strep A, Culture, Group A Strep, amoxicillin (AMOXIL) 875 MG tablet, Diphenhyd-Hydrocort-Nystatin (FIRST-DUKES MOUTHWASH) SUSP  Nasal congestion  Cough  Will go ahead and cover with amoxicillin 875 mg bid x 10 days Throat culture pending [facility] mouthwash q2-3 hours prn pain. Recommend ibuprofen or tylenol as needed Push fluids. RTC precautions discussed.  Follow up if symptoms worsen or fail to improve.  r

## 2014-01-07 ENCOUNTER — Encounter: Payer: Self-pay | Admitting: Physician Assistant

## 2014-01-09 LAB — CULTURE, GROUP A STREP: Organism ID, Bacteria: NORMAL

## 2014-01-26 ENCOUNTER — Ambulatory Visit (INDEPENDENT_AMBULATORY_CARE_PROVIDER_SITE_OTHER): Payer: BC Managed Care – PPO

## 2014-01-26 ENCOUNTER — Ambulatory Visit (INDEPENDENT_AMBULATORY_CARE_PROVIDER_SITE_OTHER): Payer: BC Managed Care – PPO | Admitting: Internal Medicine

## 2014-01-26 ENCOUNTER — Telehealth: Payer: Self-pay

## 2014-01-26 VITALS — BP 108/68 | HR 76 | Temp 98.3°F | Resp 18 | Ht 62.5 in | Wt 139.0 lb

## 2014-01-26 DIAGNOSIS — G8929 Other chronic pain: Secondary | ICD-10-CM

## 2014-01-26 DIAGNOSIS — R079 Chest pain, unspecified: Secondary | ICD-10-CM

## 2014-01-26 MED ORDER — OMEPRAZOLE 20 MG PO CPDR: 20.0000 mg | DELAYED_RELEASE_CAPSULE | Freq: Every day | ORAL | Status: DC

## 2014-01-26 NOTE — Patient Instructions (Signed)
Gastroesophageal Reflux Disease, Adult  Gastroesophageal reflux disease (GERD) happens when acid from your stomach flows up into the esophagus. When acid comes in contact with the esophagus, the acid causes soreness (inflammation) in the esophagus. Over time, GERD may create small holes (ulcers) in the lining of the esophagus.  CAUSES    Increased body weight. This puts pressure on the stomach, making acid rise from the stomach into the esophagus.   Smoking. This increases acid production in the stomach.   Drinking alcohol. This causes decreased pressure in the lower esophageal sphincter (valve or ring of muscle between the esophagus and stomach), allowing acid from the stomach into the esophagus.   Late evening meals and a full stomach. This increases pressure and acid production in the stomach.   A malformed lower esophageal sphincter.  Sometimes, no cause is found.  SYMPTOMS    Burning pain in the lower part of the mid-chest behind the breastbone and in the mid-stomach area. This may occur twice a week or more often.   Trouble swallowing.   Sore throat.   Dry cough.   Asthma-like symptoms including chest tightness, shortness of breath, or wheezing.  DIAGNOSIS   Your caregiver may be able to diagnose GERD based on your symptoms. In some cases, X-rays and other tests may be done to check for complications or to check the condition of your stomach and esophagus.  TREATMENT   Your caregiver may recommend over-the-counter or prescription medicines to help decrease acid production. Ask your caregiver before starting or adding any new medicines.   HOME CARE INSTRUCTIONS    Change the factors that you can control. Ask your caregiver for guidance concerning weight loss, quitting smoking, and alcohol consumption.   Avoid foods and drinks that make your symptoms worse, such as:   Caffeine or alcoholic drinks.   Chocolate.   Peppermint or mint flavorings.   Garlic and onions.   Spicy foods.   Citrus fruits,  such as oranges, lemons, or limes.   Tomato-based foods such as sauce, chili, salsa, and pizza.   Fried and fatty foods.   Avoid lying down for the 3 hours prior to your bedtime or prior to taking a nap.   Eat small, frequent meals instead of large meals.   Wear loose-fitting clothing. Do not wear anything tight around your waist that causes pressure on your stomach.   Raise the head of your bed 6 to 8 inches with wood blocks to help you sleep. Extra pillows will not help.   Only take over-the-counter or prescription medicines for pain, discomfort, or fever as directed by your caregiver.   Do not take aspirin, ibuprofen, or other nonsteroidal anti-inflammatory drugs (NSAIDs).  SEEK IMMEDIATE MEDICAL CARE IF:    You have pain in your arms, neck, jaw, teeth, or back.   Your pain increases or changes in intensity or duration.   You develop nausea, vomiting, or sweating (diaphoresis).   You develop shortness of breath, or you faint.   Your vomit is green, yellow, black, or looks like coffee grounds or blood.   Your stool is red, bloody, or black.  These symptoms could be signs of other problems, such as heart disease, gastric bleeding, or esophageal bleeding.  MAKE SURE YOU:    Understand these instructions.   Will watch your condition.   Will get help right away if you are not doing well or get worse.  Document Released: 02/21/2005 Document Revised: 08/06/2011 Document Reviewed: 12/01/2010  ExitCare Patient   Information 2015 [employer]. This information is not intended to replace advice given to you by your health care provider. Make sure you discuss any questions you have with your health care provider.  Chest Pain (Nonspecific)  It is often hard to give a specific diagnosis for the cause of chest pain. There is always a chance that your pain could be related to something serious, such as a heart attack or a blood clot in the lungs. You need to follow up with your health care provider for further  evaluation.  CAUSES    Heartburn.   Pneumonia or bronchitis.   Anxiety or stress.   Inflammation around your heart (pericarditis) or lung (pleuritis or pleurisy).   A blood clot in the lung.   A collapsed lung (pneumothorax). It can develop suddenly on its own (spontaneous pneumothorax) or from trauma to the chest.   Shingles infection (herpes zoster virus).  The chest wall is composed of bones, muscles, and cartilage. Any of these can be the source of the pain.   The bones can be bruised by injury.   The muscles or cartilage can be strained by coughing or overwork.   The cartilage can be affected by inflammation and become sore (costochondritis).  DIAGNOSIS   Lab tests or other studies may be needed to find the cause of your pain. Your health care provider may have you take a test called an ambulatory electrocardiogram (ECG). An ECG records your heartbeat patterns over a 24-hour period. You may also have other tests, such as:   Transthoracic echocardiogram (TTE). During echocardiography, sound waves are used to evaluate how blood flows through your heart.   Transesophageal echocardiogram (TEE).   Cardiac monitoring. This allows your health care provider to monitor your heart rate and rhythm in real time.   Holter monitor. This is a portable device that records your heartbeat and can help diagnose heart arrhythmias. It allows your health care provider to track your heart activity for several days, if needed.   Stress tests by exercise or by giving medicine that makes the heart beat faster.  TREATMENT    Treatment depends on what may be causing your chest pain. Treatment may include:   Acid blockers for heartburn.   Anti-inflammatory medicine.   Pain medicine for inflammatory conditions.   Antibiotics if an infection is present.   You may be advised to change lifestyle habits. This includes stopping smoking and avoiding alcohol, caffeine, and chocolate.   You may be advised to keep your head  raised (elevated) when sleeping. This reduces the chance of acid going backward from your stomach into your esophagus.  Most of the time, nonspecific chest pain will improve within 2-3 days with rest and mild pain medicine.   HOME CARE INSTRUCTIONS    If antibiotics were prescribed, take them as directed. Finish them even if you start to feel better.   For the next few days, avoid physical activities that bring on chest pain. Continue physical activities as directed.   Do not use any tobacco products, including cigarettes, chewing tobacco, or electronic cigarettes.   Avoid drinking alcohol.   Only take medicine as directed by your health care provider.   Follow your health care provider's suggestions for further testing if your chest pain does not go away.   Keep any follow-up appointments you made. If you do not go to an appointment, you could develop lasting (chronic) problems with pain. If there is any problem keeping an appointment, call   to reschedule.  SEEK MEDICAL CARE IF:    Your chest pain does not go away, even after treatment.   You have a rash with blisters on your chest.   You have a fever.  SEEK IMMEDIATE MEDICAL CARE IF:    You have increased chest pain or pain that spreads to your arm, neck, jaw, back, or abdomen.   You have shortness of breath.   You have an increasing cough, or you cough up blood.   You have severe back or abdominal pain.   You feel nauseous or vomit.   You have severe weakness.   You faint.   You have chills.  This is an emergency. Do not wait to see if the pain will go away. Get medical help at once. Call your local emergency services (911 in [place] Do not drive yourself to the hospital.  MAKE SURE YOU:    Understand these instructions.   Will watch your condition.   Will get help right away if you are not doing well or get worse.  Document Released: 02/21/2005 Document Revised: 05/19/2013 Document Reviewed: 12/18/2007  Exit[employer] Patient Information 2015  [employer]. This information is not intended to replace advice given to you by your health care provider. Make sure you discuss any questions you have with your health care provider.

## 2014-01-26 NOTE — Progress Notes (Signed)
   Subjective:    Patient ID: Kayla [name], female    DOB: 02/01/1991, 23 y.o.   MRN: [id number]  HPI 23 year old pt is complaining chest pain. Says its been going on for months but has progressed within the last 2 weeks. Never has been to a cardiologist. Mom is trying to schedule an appointment with one in the next couple of weeks. She describes the pain as chest tightness and sometimes little needles poking in her chest. The pain is mainly on the left side of chest. Pain comes on randomly and sometimes with stress. SOB has been going on for a couple of months on and off. Only really hurts when chest is hurting bad. Does not have asthma. Has had a chest xray at the hospital and never saw anything. Does not feel heart racing or fluttering. Never has taken anything for the pain. Has not been lifting weights or exercising. No trouble swallowing. Thyroid feels normal. Never has smoked. Never blacks out. Has woken up with chest pain during the night. ER visit in July all labs normal. Never exercises, looks well and has no cp now. No hx of reflux and never txed for it. Father died suddenly 44yo while exercising, cause unknown. Review of Systems     Objective:   Physical Exam  Constitutional: She is oriented to person, place, and time. She appears well-developed and well-nourished. No distress.  HENT:  Head: Normocephalic.  Right Ear: External ear normal.  Left Ear: External ear normal.  Mouth/Throat: Oropharynx is clear and moist.  Eyes: Conjunctivae and EOM are normal. Pupils are equal, round, and reactive to light.  Neck: Normal range of motion. Neck supple. No thyromegaly present.  Cardiovascular: Normal rate, regular rhythm, normal heart sounds and intact distal pulses.  Exam reveals no friction rub.   No murmur heard. Pulmonary/Chest: Effort normal and breath sounds normal.  Abdominal: Soft. She exhibits no mass. There is no tenderness.  Musculoskeletal: Normal range of motion.    Lymphadenopathy:    She has cervical adenopathy.  Neurological: She is alert and oriented to person, place, and time. No cranial nerve deficit. She exhibits normal muscle tone. Coordination normal.  Psychiatric: Her behavior is normal. Judgment normal.   [facility] reading (PRIMARY) by  Dr.Braedin Millhouse possible smooth mass right hilum and on lateral mid chest Need stat read  EKG normal   Refer to cardiology Establish with primary here.      Assessment & Plan:  Chronic recurrent chest pain Fhx of sudden death

## 2014-01-26 NOTE — Telephone Encounter (Signed)
Please fax work not to [phone]

## 2014-01-26 NOTE — Telephone Encounter (Signed)
Spoke to pt, she will p/u the note on Friday.

## 2014-01-26 NOTE — Progress Notes (Signed)
   Subjective:    Patient ID: Kayla [name], female    DOB: [birth year], 23 y.o.   MRN: [id number]  HPI    Review of Systems     Objective:   Physical Exam        Assessment & Plan:

## 2014-02-12 ENCOUNTER — Telehealth: Payer: Self-pay

## 2014-02-12 NOTE — Telephone Encounter (Signed)
Pt dropped off FMLA ppw 9/17, and put in Dr. Remer Macho box on 9/17, for completion in 5-7 business days. Pt saw Dr. Renato Gails on one appt. And was put out of work for a few days, and then on her most recent visit saw Dr. Perrin Maltese. When this is complete please return to disability box at 102 checkout. Jasmine or myself will scan ppw into system, then fax to [phone] as well as make copy for pt pick up.

## 2014-02-13 NOTE — Telephone Encounter (Signed)
To FMLA dept

## 2014-02-15 ENCOUNTER — Telehealth: Payer: Self-pay | Admitting: Internal Medicine

## 2014-02-15 NOTE — Telephone Encounter (Signed)
Called patient inform her that her FMLA ppw has been completed and faxed to her job as indicated on the form she filled. Also left the original copy for patient in the pick up drawer.

## 2014-02-16 DIAGNOSIS — Z0271 Encounter for disability determination: Secondary | ICD-10-CM

## 2014-02-17 ENCOUNTER — Encounter: Payer: Self-pay | Admitting: Family Medicine

## 2014-02-17 ENCOUNTER — Ambulatory Visit (INDEPENDENT_AMBULATORY_CARE_PROVIDER_SITE_OTHER): Payer: BC Managed Care – PPO | Admitting: Family Medicine

## 2014-02-17 VITALS — BP 108/54 | HR 97 | Temp 98.3°F | Resp 16 | Ht 62.5 in | Wt 137.6 lb

## 2014-02-17 DIAGNOSIS — R0789 Other chest pain: Secondary | ICD-10-CM

## 2014-02-17 DIAGNOSIS — Z8249 Family history of ischemic heart disease and other diseases of the circulatory system: Secondary | ICD-10-CM

## 2014-02-17 DIAGNOSIS — Z8489 Family history of other specified conditions: Secondary | ICD-10-CM

## 2014-02-17 DIAGNOSIS — R0602 Shortness of breath: Secondary | ICD-10-CM

## 2014-02-17 LAB — CBC WITH DIFFERENTIAL/PLATELET
Basophils Absolute: 0.1 10*3/uL (ref 0.0–0.1)
Basophils Relative: 1 % (ref 0–1)
EOS PCT: 3 % (ref 0–5)
Eosinophils Absolute: 0.2 10*3/uL (ref 0.0–0.7)
HEMATOCRIT: 43.5 % (ref 36.0–46.0)
HEMOGLOBIN: 14.8 g/dL (ref 12.0–15.0)
LYMPHS PCT: 30 % (ref 12–46)
Lymphs Abs: 2.3 10*3/uL (ref 0.7–4.0)
MCH: 29.4 pg (ref 26.0–34.0)
MCHC: 34 g/dL (ref 30.0–36.0)
MCV: 86.3 fL (ref 78.0–100.0)
MONO ABS: 0.5 10*3/uL (ref 0.1–1.0)
MONOS PCT: 7 % (ref 3–12)
Neutro Abs: 4.5 10*3/uL (ref 1.7–7.7)
Neutrophils Relative %: 59 % (ref 43–77)
Platelets: 346 10*3/uL (ref 150–400)
RBC: 5.04 MIL/uL (ref 3.87–5.11)
RDW: 13.3 % (ref 11.5–15.5)
WBC: 7.6 10*3/uL (ref 4.0–10.5)

## 2014-02-17 LAB — COMPREHENSIVE METABOLIC PANEL
ALBUMIN: 4.4 g/dL (ref 3.5–5.2)
ALT: 10 U/L (ref 0–35)
AST: 15 U/L (ref 0–37)
Alkaline Phosphatase: 82 U/L (ref 39–117)
BUN: 9 mg/dL (ref 6–23)
CALCIUM: 9.5 mg/dL (ref 8.4–10.5)
CHLORIDE: 104 meq/L (ref 96–112)
CO2: 26 mEq/L (ref 19–32)
Creat: 0.82 mg/dL (ref 0.50–1.10)
GLUCOSE: 63 mg/dL — AB (ref 70–99)
Potassium: 3.8 mEq/L (ref 3.5–5.3)
Sodium: 138 mEq/L (ref 135–145)
Total Bilirubin: 0.4 mg/dL (ref 0.2–1.2)
Total Protein: 7 g/dL (ref 6.0–8.3)

## 2014-02-17 LAB — TSH: TSH: 1.861 u[IU]/mL (ref 0.350–4.500)

## 2014-02-17 NOTE — Progress Notes (Signed)
Subjective:    Patient ID: Kayla [name], female    DOB: 1991/05/16, 23 y.o.   MRN: [id number]  This chart was scribed for Ethelda Chick, MD by Gwenevere Abbot, ED scribe. This patient was seen in room Room/bed 21 and the patient's care was started at 10:49 AM HPI HPI Comments:  Kayla [name] is a 23 y.o. female who presents to [facility] to establish care. Pt was seen by Dr. Perrin Maltese on 9/1 with concerns of CP. This has been going on for 6 months. Pt reports that she experiences chest tightness on the lefts side, and characterizes it as needles poking randomly and chest throbbing. Pt reports that the pain is exacerbated with stress, with associated symptoms of SOB. No h/o asthma, no palpitations. Pt denies taking medication for issues. Pt denies use of weights or exercise. No problem swallowing. Pt denies smoking. Pt reports that she has woken up at night with CP but rarely occurs.  Pt visited ED in 01-21-23. Father died at 39 while exercising: cause massive AMI. Mother was attempting to schedule with cardiology and pt was referred to cardiology by Dr. Perrin Maltese. Dr. Perrin Maltese filled out paper work for referral and FMLA.  BMET: 11/27/13 normal  Troponin 7/3/ negative No CBC in 2 years.  Appointment with Cardiology scheduled for 02/25/2014 Dr. Perrin Maltese did EKG on 9/1 that was normal CXR: 9/1 normal CXR: 11/27/13 normal  CP: Pt reports that she experiences CP at least 3 times a week. Pt reports that no particular activity triggers the pain. Pt reports that the pain sometimes causes her to experiences SOB. Pt reports that laying down helps with pain. Pt reports that the pain can last from 15 minutes to 6 hours. Pt reports that it can feel like needles poking in one particular spot, or it can feel like pressure, which is when she experiences the SOB. Pt reports that she can experience SOB up to 5 times a month. Pt reports that she sometimes takes tylenol, with mild relief. Pt denies nausea, vomiting, indigestion or diarrhea  with CP. Pt reports that she had a collapsed lung as a baby, but has not had a recent scan of her lungs. Was not prescribed any medication at visit with Dr. Perrin Maltese on 01/26/2014.  Diet and Exercise: Pt does not smoke or consume alcohol. Pt reports that she does not exercise, but she is active.  Physical: Last physical in [employer]. Pap Smear: 07/2012 performed by Antionette Char.  Pt has never needed a mammogram.  Pt has not completed Gardisil series but is receiving from gynecology. Pt has irregular periods, due to depo shot. Pt has discontinued depo shot, with the last one in February. Tetanus: Pt is not UTD Flu: Vaccine: Pt has never had flu shot.  Family History: Mother: 43 y.o., no health issues. Father: Deceased. Died at 6 y.o. of MI. No siblings.   Review of Systems  Constitutional: Negative for fever, chills, diaphoresis and fatigue.  Eyes: Negative for visual disturbance.  Respiratory: Positive for chest tightness and shortness of breath. Negative for cough.   Cardiovascular: Positive for chest pain. Negative for palpitations and leg swelling.  Gastrointestinal: Negative for nausea, vomiting, abdominal pain, diarrhea and constipation.  Endocrine: Negative for cold intolerance, heat intolerance, polydipsia, polyphagia and polyuria.  Neurological: Negative for dizziness, tremors, seizures, syncope, facial asymmetry, speech difficulty, weakness, light-headedness, numbness and headaches.  Psychiatric/Behavioral: Negative for dysphoric mood. The patient is not nervous/anxious.  Objective:   Physical Exam  Nursing note and vitals reviewed. Constitutional: She is oriented to person, place, and time. She appears well-developed and well-nourished. No distress.  HENT:  Head: Normocephalic and atraumatic.  Right Ear: External ear normal.  Left Ear: External ear normal.  Nose: Nose normal.  Mouth/Throat: Oropharynx is clear and moist.  Eyes: Conjunctivae and EOM are  normal. Pupils are equal, round, and reactive to light.  Neck: Normal range of motion. Neck supple. Carotid bruit is not present. No thyromegaly present.  Cardiovascular: Normal rate, regular rhythm, normal heart sounds and intact distal pulses.  Exam reveals no gallop and no friction rub.   No murmur heard. Pulmonary/Chest: Effort normal and breath sounds normal. She has no wheezes. She has no rales. She exhibits tenderness.  Mild tenderness to palpation left anterior chest.  Abdominal: Soft. Bowel sounds are normal. She exhibits no distension and no mass. There is no tenderness. There is no rebound and no guarding.  Musculoskeletal: Normal range of motion.       Right shoulder: Normal. She exhibits no pain.       Left shoulder: Normal. She exhibits no pain.       Cervical back: Normal. She exhibits no pain.       Thoracic back: Normal. She exhibits no pain.       Lumbar back: Normal. She exhibits no pain.  Full ROM cervical spine without pain. Full ROM shoulders, without pain.  Lymphadenopathy:    She has no cervical adenopathy.  Neurological: She is alert and oriented to person, place, and time. No cranial nerve deficit.  Skin: Skin is warm and dry. No rash noted. She is not diaphoretic. No erythema. No pallor.  Psychiatric: She has a normal mood and affect. Her behavior is normal.       Assessment & Plan:  Other chest pain - Plan: CBC with Differential, Comprehensive metabolic panel, TSH, CT Angio Chest W/Cm &/Or Wo Cm  Shortness of breath - Plan: TSH, CT Angio Chest W/Cm &/Or Wo Cm  Family history of early CAD  Family history of early death   1. Chest pain:  New to this provider; onset six months ago. Non-exertional.  Appointment with cardiology next week. Obtain labs including CBC to rule out anemia, CMET, TSH.  Refer for CT angio to rule out PE.  To ED for acute worsening. 2.  SOB: New; intermittent; associated with chest pain L sided; s/p CXR 01/26/2014 and 11/2013; obtain CT  angio to evaluate for PE.   3. Family history of early CAD with early death: Father died at age 4 of AMI.  Refer to cardiology for evaluation of chest pain and SOB.   No orders of the defined types were placed in this encounter.   I personally performed the services described in this documentation, which was scribed in my presence.  The recorded information has been reviewed and is accurate.  Nilda Simmer, M.D.  [facility] [address] [place], [place]  [address] [phone] phone [phone] fax

## 2014-02-18 DIAGNOSIS — Z8249 Family history of ischemic heart disease and other diseases of the circulatory system: Secondary | ICD-10-CM | POA: Insufficient documentation

## 2014-02-18 DIAGNOSIS — Z8489 Family history of other specified conditions: Secondary | ICD-10-CM | POA: Insufficient documentation

## 2014-02-23 ENCOUNTER — Telehealth: Payer: Self-pay

## 2014-02-23 NOTE — Telephone Encounter (Signed)
Mom called and requested an out of work note for patient.  She needs to leave work whenever her chest hurts.  Wants to talk to Dr. Katrinka BlazingSmith and her options.  Informed mom of FMLA and the $15 charge.  She is okay with this.   [phone]  Burnis KingfisherPamela Miller

## 2014-02-24 NOTE — Telephone Encounter (Signed)
Spoke to mother- pt is going to get FMLA paperwork from her employer to fill out.

## 2014-02-25 ENCOUNTER — Institutional Professional Consult (permissible substitution): Payer: BC Managed Care – PPO | Admitting: Cardiovascular Disease

## 2014-03-01 ENCOUNTER — Ambulatory Visit (HOSPITAL_COMMUNITY): Payer: BC Managed Care – PPO

## 2014-03-03 ENCOUNTER — Other Ambulatory Visit: Payer: BC Managed Care – PPO

## 2014-03-10 ENCOUNTER — Encounter: Payer: Self-pay | Admitting: Cardiovascular Disease

## 2014-03-12 ENCOUNTER — Encounter (HOSPITAL_COMMUNITY): Payer: Self-pay | Admitting: Emergency Medicine

## 2014-03-12 ENCOUNTER — Emergency Department (HOSPITAL_COMMUNITY)
Admission: EM | Admit: 2014-03-12 | Discharge: 2014-03-13 | Disposition: A | Discharge status: Home / Self Care | Payer: BC Managed Care – PPO | Attending: Emergency Medicine | Admitting: Emergency Medicine

## 2014-03-12 DIAGNOSIS — N760 Acute vaginitis: Secondary | ICD-10-CM | POA: Insufficient documentation

## 2014-03-12 DIAGNOSIS — R102 Pelvic and perineal pain: Secondary | ICD-10-CM | POA: Diagnosis not present

## 2014-03-12 DIAGNOSIS — B9689 Other specified bacterial agents as the cause of diseases classified elsewhere: Secondary | ICD-10-CM

## 2014-03-12 DIAGNOSIS — R1031 Right lower quadrant pain: Secondary | ICD-10-CM

## 2014-03-12 DIAGNOSIS — Z792 Long term (current) use of antibiotics: Secondary | ICD-10-CM | POA: Diagnosis not present

## 2014-03-12 DIAGNOSIS — R1030 Lower abdominal pain, unspecified: Secondary | ICD-10-CM | POA: Diagnosis present

## 2014-03-12 DIAGNOSIS — R1032 Left lower quadrant pain: Secondary | ICD-10-CM

## 2014-03-12 DIAGNOSIS — Z8742 Personal history of other diseases of the female genital tract: Secondary | ICD-10-CM

## 2014-03-12 LAB — CBC WITH DIFFERENTIAL/PLATELET
Basophils Absolute: 0.1 10*3/uL (ref 0.0–0.1)
Basophils Relative: 0 % (ref 0–1)
EOS PCT: 4 % (ref 0–5)
Eosinophils Absolute: 0.5 10*3/uL (ref 0.0–0.7)
HCT: 41.5 % (ref 36.0–46.0)
Hemoglobin: 13.9 g/dL (ref 12.0–15.0)
LYMPHS ABS: 3.7 10*3/uL (ref 0.7–4.0)
Lymphocytes Relative: 28 % (ref 12–46)
MCH: 29.4 pg (ref 26.0–34.0)
MCHC: 33.5 g/dL (ref 30.0–36.0)
MCV: 87.7 fL (ref 78.0–100.0)
Monocytes Absolute: 0.9 10*3/uL (ref 0.1–1.0)
Monocytes Relative: 7 % (ref 3–12)
NEUTROS PCT: 61 % (ref 43–77)
Neutro Abs: 8.3 10*3/uL — ABNORMAL HIGH (ref 1.7–7.7)
Platelets: 282 10*3/uL (ref 150–400)
RBC: 4.73 MIL/uL (ref 3.87–5.11)
RDW: 12.4 % (ref 11.5–15.5)
WBC: 13.4 10*3/uL — ABNORMAL HIGH (ref 4.0–10.5)

## 2014-03-12 LAB — URINALYSIS, ROUTINE W REFLEX MICROSCOPIC
BILIRUBIN URINE: NEGATIVE
Glucose, UA: NEGATIVE mg/dL
HGB URINE DIPSTICK: NEGATIVE
Ketones, ur: NEGATIVE mg/dL
Leukocytes, UA: NEGATIVE
Nitrite: NEGATIVE
Protein, ur: NEGATIVE mg/dL
SPECIFIC GRAVITY, URINE: 1.028 (ref 1.005–1.030)
Urobilinogen, UA: 0.2 mg/dL (ref 0.0–1.0)
pH: 5.5 (ref 5.0–8.0)

## 2014-03-12 LAB — I-STAT BETA HCG BLOOD, ED (MC, WL, AP ONLY): I-stat hCG, quantitative: <5 m[IU]/mL (ref <5)

## 2014-03-12 MED ORDER — HYDROCODONE-ACETAMINOPHEN 5-325 MG PO TABS
1.0000 | ORAL_TABLET | Freq: Once | ORAL | Status: AC
  Administered 2014-03-12: 1 via ORAL
  Filled 2014-03-12: qty 1

## 2014-03-12 NOTE — ED Notes (Signed)
Pt has been having diffuse abdominal cramping x 2 weeks.  Pt has tried OTC pain relievers w/o relief.

## 2014-03-12 NOTE — ED Provider Notes (Signed)
CSN: [id number]     Arrival date & time 03/12/14  2210 History   First MD Initiated Contact with Patient 03/12/14 2238     Chief Complaint  Patient presents with  . Abdominal Cramping     (Consider location/radiation/quality/duration/timing/severity/associated sxs/prior Treatment) HPI Comments: Lisa Rocaierra L Miller is a 23 y.o. female with a PMHx of ovarian cysts, intermittent ongoing abd pain, and irregular menses, who presents to the ED with gradual onset lower abd cramping x1 week. Reports the pain is 8/10, intermittent, nonradiating, worse with walking, and unrelieved with motrin, tylenol, and pamprin. States this is similar to her prior episodes of abd pain, and feels like it did when she had ovarian cysts. States these cysts have been evaluated before and come and go frequently. Pain always occurs after her menses. LMP 03/02/14. She denies fevers, chills, CP, SOB, cough, upper abd pain, n/v/d/c, obstipation, hematuria, dysuria, malodorous urine, hematochezia, melena, abd distension/bloating, vaginal bleeding, vaginal discharge, dyspareunia, flank pain, myalgias or arthralgias. Denies sick contacts or recent travel. Sexually active with one partner, unprotected. Denies possibility of pregnancy. States her last period was shorter than usual, but no heavier or coming at irregular intervals as it previously had.   Patient is a 23 y.o. female presenting with cramps. The history is provided by the patient. No language interpreter was used.  Abdominal Cramping Pain location: lower abd. Pain quality: cramping   Pain radiates to:  Does not radiate Pain severity:  Moderate (8/10) Onset quality:  Gradual Duration:  1 week Timing:  Intermittent Progression:  Unchanged Chronicity:  Recurrent Context: not recent sexual activity, not recent travel, not sick contacts and not suspicious food intake   Relieved by:  Nothing Worsened by:  Movement (walking) Ineffective treatments:  Acetaminophen and NSAIDs  (tylenol, motrin, pamprin) Associated symptoms: no anorexia, no belching, no chest pain, no chills, no constipation, no cough, no diarrhea, no dysuria, no fever, no flatus, no hematemesis, no hematochezia, no hematuria, no melena, no nausea, no shortness of breath, no vaginal bleeding, no vaginal discharge and no vomiting   Risk factors: NSAID use   Risk factors: no alcohol abuse     Past Medical History  Diagnosis Date  . Medical history non-contributory    Past Surgical History  Procedure Laterality Date  . No past surgeries     Family History  Problem Relation Age of Onset  . Heart disease Father 6944    [facility]   History  Substance Use Topics  . Smoking status: Never Smoker   . Smokeless tobacco: Never Used  . Alcohol Use: No   OB History   Grav Para Term Preterm Abortions TAB SAB Ect Mult Living   1 1 1       1      Review of Systems  Constitutional: Negative for fever and chills.  Respiratory: Negative for cough and shortness of breath.   Cardiovascular: Negative for chest pain.  Gastrointestinal: Negative for nausea, vomiting, abdominal pain, diarrhea, constipation, blood in stool, melena, hematochezia, abdominal distention, anorexia, flatus and hematemesis.  Genitourinary: Positive for pelvic pain. Negative for dysuria, urgency, frequency, hematuria, flank pain, decreased urine volume, vaginal bleeding, vaginal discharge, difficulty urinating, vaginal pain and dyspareunia.  Musculoskeletal: Negative for arthralgias, back pain and myalgias.  Skin: Negative for color change.  Neurological: Negative for dizziness, weakness and headaches.   10 Systems reviewed and are negative for acute change except as noted in the HPI.    Allergies  Review of patient's allergies indicates no  known allergies.  Home Medications   Prior to Admission medications   Medication Sig Start Date End Date Taking? Authorizing Provider  acetaminophen (TYLENOL) 500 MG tablet Take 1,000 mg by mouth  every 6 (six) hours as needed (for pain.).   Yes Historical Provider, MD  Acetaminophen-Caff-Pyrilamine (MIDOL COMPLETE) 500-60-15 MG TABS Take 2 tablets by mouth every 4 (four) hours as needed (for pain.).   Yes Historical Provider, MD  naproxen sodium (PAMPRIN ALL DAY RELIEF MAX ST) 220 MG tablet Take 440 mg by mouth 2 (two) times daily as needed (for pain.).   Yes Historical Provider, MD   BP 132/79  Pulse 83  Temp(Src) 98.6 F (37 C) (Oral)  Resp 16  Ht 5' (1.524 m)  Wt 142 lb (64.411 kg)  BMI 27.73 kg/m2  SpO2 100%  LMP 03/02/2014 Physical Exam  Nursing note and vitals reviewed. Constitutional: She is oriented to person, place, and time. Vital signs are normal. She appears well-developed and well-nourished. No distress.  VSS, NAD, afebrile and nontoxic  HENT:  Head: Normocephalic and atraumatic.  Mouth/Throat: Mucous membranes are normal.  Eyes: Conjunctivae and EOM are normal. Right eye exhibits no discharge. Left eye exhibits no discharge.  Neck: Normal range of motion. Neck supple.  Cardiovascular: Normal rate, regular rhythm, normal heart sounds and intact distal pulses.  Exam reveals no gallop and no friction rub.   No murmur heard. Pulmonary/Chest: Effort normal and breath sounds normal. No respiratory distress. She has no decreased breath sounds. She has no wheezes. She has no rhonchi. She has no rales.  Abdominal: Soft. Normal appearance and bowel sounds are normal. She exhibits no distension. There is tenderness in the right lower quadrant, suprapubic area and left lower quadrant. There is no rigidity, no rebound, no guarding, no CVA tenderness and no tenderness at McBurney's point.    Soft, nondistended, +BS throughout, with lower abd TTP along pelvic brim, no r/g/r, neg mcburney's point TTP. No CVA TTP. Neg psoas sign.   Genitourinary: Uterus normal. Pelvic exam was performed with patient supine. There is no rash, tenderness, lesion or injury on the right labia. There is  no rash, tenderness, lesion or injury on the left labia. Cervix exhibits no motion tenderness, no discharge and no friability. Right adnexum displays no mass, no tenderness and no fullness. Left adnexum displays no mass, no tenderness and no fullness. No erythema, tenderness or bleeding around the vagina. Vaginal discharge (thick white) found.  No rashes, lesions, or tenderness to external genitalia. No erythema, injury, or tenderness to vaginal mucosa. Thick white vaginal discharge within vaginal vault, no bleeding within vaginal vault. No adnexal masses, tenderness, or fullness. No CMT, cervical friability, or discharge from cervical os. Uterus non-deviated, mobile, nonTTP, and without enlargement.   Musculoskeletal: Normal range of motion.  Neurological: She is alert and oriented to person, place, and time.  Skin: Skin is warm, dry and intact. No rash noted.  Psychiatric: She has a normal mood and affect.    ED Course  Procedures (including critical care time) Labs Review Labs Reviewed  WET PREP, GENITAL - Abnormal; Notable for the following:    Clue Cells Wet Prep HPF POC MANY (*)    All other components within normal limits  CBC WITH DIFFERENTIAL - Abnormal; Notable for the following:    WBC 13.4 (*)    Neutro Abs 8.3 (*)    All other components within normal limits  COMPREHENSIVE METABOLIC PANEL - Abnormal; Notable for the following:  Glucose, Bld 102 (*)    Total Bilirubin 0.2 (*)    All other components within normal limits  URINALYSIS, ROUTINE W REFLEX MICROSCOPIC - Abnormal; Notable for the following:    APPearance CLOUDY (*)    All other components within normal limits  GC/CHLAMYDIA PROBE AMP  LIPASE, BLOOD  I-STAT BETA HCG BLOOD, ED ([facility], [facility], [facility] ONLY)    Imaging Review No results found. Transvaginal U/S 11/2013: no abnormalities   EKG Interpretation None      MDM   Final diagnoses:  Pelvic pain in female  Bilateral lower abdominal cramping  History of ovarian  cyst  BV (bacterial vaginosis)    23y/o female with recurrent lower abd cramping, seen several times in ED and [facility] as well as by PCP for same complaint over the last few yrs, always occurs after periods. Has had work ups in the past, states she's had ovarian cysts, but last U/S didn't show this in July. Pelvic today showing some white discharge, wet prep showing BV, will tx with flagyl. No adnexal masses or tenderness, no CMT, low concern for ovarian torsion or ruptured cyst therefore discussed w/ pt that ultrasound could be helpful in evaluating for cyst since this pain is similar to her prior episodes of cysts, but pt declined and opted to wait until she's seen by her PCP. Beta HCG ordered and neg for pregnancy. CBC w/diff showing very mildly elevated WBC at 13.4, but pt has had this in the past with similar complaints, nonspecific and doubt need for further work up. CMP benign, lipase WNL. U/A cloudy but otherwise WNL, doubt UTI today. Doubt need for emergent imaging. GC/CT pending, no need to treat empirically due to low concern for this, and neg GC/CT several times in the past. Discussed good oral hydration to help with flushing bladder and avoiding UTI. Discussed no douching, and urinating after intercourse. Given norco here with relief of symptoms, will give rx for norco and naprosyn, w/ f/up at PCP or OBGYN later this week. I explained the diagnosis and have given explicit precautions to return to the ER including for any other new or worsening symptoms. The patient understands and accepts the medical plan as it's been dictated and I have answered their questions. Discharge instructions concerning home care and prescriptions have been given. The patient is STABLE and is discharged to home in good condition.  BP 132/79  Pulse 83  Temp(Src) 98.6 F (37 C) (Oral)  Resp 16  Ht 5' (1.524 m)  Wt 142 lb (64.411 kg)  BMI 27.73 kg/m2  SpO2 100%  LMP 03/02/2014  Meds ordered this encounter    Medications  . HYDROcodone-acetaminophen (NORCO/VICODIN) 5-325 MG per tablet 1 tablet    Sig:   . naproxen (NAPROSYN) 500 MG tablet    Sig: Take 1 tablet (500 mg total) by mouth 2 (two) times daily as needed for mild pain, moderate pain or headache (TAKE WITH MEALS.).    Dispense:  20 tablet    Refill:  0    Order Specific Question:  Supervising Provider    Answer:  Eber Hong D [[id number]]  . HYDROcodone-acetaminophen (NORCO) 5-325 MG per tablet    Sig: Take 1 tablet by mouth every 6 (six) hours as needed for severe pain.    Dispense:  6 tablet    Refill:  0    Order Specific Question:  Supervising Provider    Answer:  Eber Hong D [[id number]]  . metroNIDAZOLE (FLAGYL) 500 MG tablet  Sig: Take 1 tablet (500 mg total) by mouth 2 (two) times daily. One po bid x 7 days    Dispense:  14 tablet    Refill:  0    Order Specific Question:  Supervising Provider    Answer:  Eber Hong D [address] [place], PA-C 03/13/14 [phone]

## 2014-03-13 LAB — COMPREHENSIVE METABOLIC PANEL
ALK PHOS: 84 U/L (ref 39–117)
ALT: 11 U/L (ref 0–35)
AST: 15 U/L (ref 0–37)
Albumin: 4 g/dL (ref 3.5–5.2)
Anion gap: 15 (ref 5–15)
BUN: 13 mg/dL (ref 6–23)
CHLORIDE: 104 meq/L (ref 96–112)
CO2: 22 meq/L (ref 19–32)
Calcium: 9 mg/dL (ref 8.4–10.5)
Creatinine, Ser: 0.74 mg/dL (ref 0.50–1.10)
GLUCOSE: 102 mg/dL — AB (ref 70–99)
POTASSIUM: 4.2 meq/L (ref 3.7–5.3)
SODIUM: 141 meq/L (ref 137–147)
Total Bilirubin: 0.2 mg/dL — ABNORMAL LOW (ref 0.3–1.2)
Total Protein: 7.2 g/dL (ref 6.0–8.3)

## 2014-03-13 LAB — WET PREP, GENITAL
TRICH WET PREP: NONE SEEN
WBC, Wet Prep HPF POC: NONE SEEN
YEAST WET PREP: NONE SEEN

## 2014-03-13 LAB — LIPASE, BLOOD: Lipase: 37 U/L (ref 11–59)

## 2014-03-13 LAB — GC/CHLAMYDIA PROBE AMP
CT PROBE, AMP APTIMA: NEGATIVE
GC PROBE AMP APTIMA: NEGATIVE

## 2014-03-13 MED ORDER — NAPROXEN 500 MG PO TABS: 500.0000 mg | ORAL_TABLET | Freq: Two times a day (BID) | ORAL | Status: DC | PRN

## 2014-03-13 MED ORDER — METRONIDAZOLE 500 MG PO TABS: 500.0000 mg | ORAL_TABLET | Freq: Two times a day (BID) | ORAL | Status: DC

## 2014-03-13 MED ORDER — HYDROCODONE-ACETAMINOPHEN 5-325 MG PO TABS: 1.0000 | ORAL_TABLET | Freq: Four times a day (QID) | ORAL | Status: DC | PRN

## 2014-03-13 NOTE — Discharge Instructions (Signed)
Your lower abdominal pain is likely related to ovarian cysts, since you've had this in the past with similar pain, and the remainder of your labs are relatively normal. Your vaginal swab showed bacterial vaginosis, take flagyl as directed for this, don't drink alcohol while taking this medication. Use naprosyn as directed, and norco for breakthrough pain as needed, but don't drive while taking norco. The lab will call you if the tests for gonorrhea or chlamydia return abnormal. See your regular doctor or OBGYN for ongoing management of your recurrent lower abdominal cramping and ovarian cysts. Return to the ER for changes or worsening symptoms.   Abdominal Pain, Women Abdominal (stomach, pelvic, or belly) pain can be caused by many things. It is important to tell your doctor:  The location of the pain.  Does it come and go or is it present all the time?  Are there things that start the pain (eating certain foods, exercise)?  Are there other symptoms associated with the pain (fever, nausea, vomiting, diarrhea)? All of this is helpful to know when trying to find the cause of the pain. CAUSES   Stomach: virus or bacteria infection, or ulcer.  Intestine: appendicitis (inflamed appendix), regional ileitis (Crohn's disease), ulcerative colitis (inflamed colon), irritable bowel syndrome, diverticulitis (inflamed diverticulum of the colon), or cancer of the stomach or intestine.  Gallbladder disease or stones in the gallbladder.  Kidney disease, kidney stones, or infection.  Pancreas infection or cancer.  Fibromyalgia (pain disorder).  Diseases of the female organs:  Uterus: fibroid (non-cancerous) tumors or infection.  Fallopian tubes: infection or tubal pregnancy.  Ovary: cysts or tumors.  Pelvic adhesions (scar tissue).  Endometriosis (uterus lining tissue growing in the pelvis and on the pelvic organs).  Pelvic congestion syndrome (female organs filling up with blood just before  the menstrual period).  Pain with the menstrual period.  Pain with ovulation (producing an egg).  Pain with an IUD (intrauterine device, birth control) in the uterus.  Cancer of the female organs.  Functional pain (pain not caused by a disease, may improve without treatment).  Psychological pain.  Depression. DIAGNOSIS  Your doctor will decide the seriousness of your pain by doing an examination.  Blood tests.  X-rays.  Ultrasound.  CT scan (computed tomography, special type of X-ray).  MRI (magnetic resonance imaging).  Cultures, for infection.  Barium enema (dye inserted in the large intestine, to better view it with X-rays).  Colonoscopy (looking in intestine with a lighted tube).  Laparoscopy (minor surgery, looking in abdomen with a lighted tube).  Major abdominal exploratory surgery (looking in abdomen with a large incision). TREATMENT  The treatment will depend on the cause of the pain.   Many cases can be observed and treated at home.  Over-the-counter medicines recommended by your caregiver.  Prescription medicine.  Antibiotics, for infection.  Birth control pills, for painful periods or for ovulation pain.  Hormone treatment, for endometriosis.  Nerve blocking injections.  Physical therapy.  Antidepressants.  Counseling with a psychologist or psychiatrist.  Minor or major surgery. HOME CARE INSTRUCTIONS   Do not take laxatives, unless directed by your caregiver.  Take over-the-counter pain medicine only if ordered by your caregiver. Do not take aspirin because it can cause an upset stomach or bleeding.  Try a clear liquid diet (broth or water) as ordered by your caregiver. Slowly move to a bland diet, as tolerated, if the pain is related to the stomach or intestine.  Have a thermometer and take your  temperature several times a day, and record it.  Bed rest and sleep, if it helps the pain.  Avoid sexual intercourse, if it causes  pain.  Avoid stressful situations.  Keep your follow-up appointments and tests, as your caregiver orders.  If the pain does not go away with medicine or surgery, you may try:  Acupuncture.  Relaxation exercises (yoga, meditation).  Group therapy.  Counseling. SEEK MEDICAL CARE IF:   You notice certain foods cause stomach pain.  Your home care treatment is not helping your pain.  You need stronger pain medicine.  You want your IUD removed.  You feel faint or lightheaded.  You develop nausea and vomiting.  You develop a rash.  You are having side effects or an allergy to your medicine. SEEK IMMEDIATE MEDICAL CARE IF:   Your pain does not go away or gets worse.  You have a fever.  Your pain is felt only in portions of the abdomen. The right side could possibly be appendicitis. The left lower portion of the abdomen could be colitis or diverticulitis.  You are passing blood in your stools (bright red or black tarry stools, with or without vomiting).  You have blood in your urine.  You develop chills, with or without a fever.  You pass out. MAKE SURE YOU:   Understand these instructions.  Will watch your condition.  Will get help right away if you are not doing well or get worse. Document Released: 03/11/2007 Document Revised: 09/28/2013 Document Reviewed: 03/31/2009 [facility] Patient Information 2015 [place]ExitCare, [place]. This information is not intended to replace advice given to you by your health care provider. Make sure you discuss any questions you have with your health care provider.  Bacterial Vaginosis Bacterial vaginosis is an infection of the vagina. It happens when too many of certain germs (bacteria) grow in the vagina. HOME CARE  Take your medicine as told by your doctor.  Finish your medicine even if you start to feel better.  Do not have sex until you finish your medicine and are better.  Tell your sex partner that you have an infection. They  should see their doctor for treatment.  Practice safe sex. Use condoms. Have only one sex partner. GET HELP IF:  You are not getting better after 3 days of treatment.  You have more grey fluid (discharge) coming from your vagina than before.  You have more pain than before.  You have a fever. MAKE SURE YOU:   Understand these instructions.  Will watch your condition.  Will get help right away if you are not doing well or get worse. Document Released: 02/21/2008 Document Revised: 03/04/2013 Document Reviewed: 12/24/2012 [facility]ExitCare Patient Information 2015 [place], [place]. This information is not intended to replace advice given to you by your health care provider. Make sure you discuss any questions you have with your health care provider.  Pelvic Pain Pelvic pain is pain felt below the belly button and between your hips. It can be caused by many different things. It is important to get help right away. This is especially true for severe, sharp, or unusual pain that comes on suddenly.  HOME CARE  Only take medicine as told by your doctor.  Rest as told by your doctor.  Eat a healthy diet, such as fruits, vegetables, and lean meats.  Drink enough fluids to keep your pee (urine) clear or pale yellow, or as told.  Avoid sex (intercourse) if it causes pain.  Apply warm or cold packs  to your lower belly (abdomen). Use the type of pack that helps the pain.  Avoid situations that cause you stress.  Keep a journal to track your pain. Write down:  When the pain started.  Where it is located.  If there are things that seem to be related to the pain, such as food or your period.  Follow up with your doctor as told. GET HELP RIGHT AWAY IF:   You have heavy bleeding from the vagina.  You have more pelvic pain.  You feel lightheaded or pass out (faint).  You have chills.  You have pain when you pee or have blood in your pee.  You cannot stop having watery poop  (diarrhea).  You cannot stop throwing up (vomiting).  You have a fever or lasting symptoms for more than 3 days.  You have a fever and your symptoms suddenly get worse.  You are being physically or sexually abused.  Your medicine does not help your pain.  You have fluid (discharge) coming from your vagina that is not normal. MAKE SURE YOU:  Understand these instructions.  Will watch your condition.  Will get help if you are not doing well or get worse. Document Released: 10/31/2007 Document Revised: 11/13/2011 Document Reviewed: 09/03/2011 [facility] AllianceExitCare Patient Information 2015 [place]ExitCare, [place]. This information is not intended to replace advice given to you by your health care provider. Make sure you discuss any questions you have with your health care provider.

## 2014-03-13 NOTE — ED Provider Notes (Signed)
Medical screening examination/treatment/procedure(s) were performed by non-physician practitioner and as supervising physician I was immediately available for consultation/collaboration.  Sedrick Tober L Sharifah Champine, MD 03/13/14 1552 

## 2014-03-15 ENCOUNTER — Telehealth: Payer: Self-pay

## 2014-03-15 NOTE — Telephone Encounter (Signed)
Pt brought fmla forms to [employer]. fmla form sheet completed and $15 collected. Pt given copies of blank forms and fmla sheet. Papers forwarded to fmla desk.  Bf  Please call pt when ready.

## 2014-03-17 ENCOUNTER — Encounter: Payer: Self-pay | Admitting: Family Medicine

## 2014-03-17 ENCOUNTER — Ambulatory Visit: Payer: BC Managed Care – PPO | Admitting: Family Medicine

## 2014-03-17 NOTE — Telephone Encounter (Signed)
Called patient. Need FMLA forms for frequent chest pains. Chest pains occur 3-4x a week, especially in the afternoon. Patient has to leave work early and needs FMLA on file. [facility].

## 2014-03-19 NOTE — Telephone Encounter (Signed)
Left message on patient's voicemail to return my call. Per [facility], patient did not return cardiology office's phone calls to schedule a cardiology appointment; has she seen a cardiologist elsewhere.  Also, per [employer], pt did not undergo CT chest as I recommended and scheduled at her visit with me on 02/17/14. Thus, she has not completed her work up for chest pain. Thus, I am not willing to complete FMLA paperwork until she adequately evaluates the chest pain.  Have there been any issues to her undergoing these evaluations that I need to be aware of?

## 2014-03-19 NOTE — Telephone Encounter (Signed)
Spoke with patient --- she was unable to schedule cardiology appointment and CT angio appointment because she will be terminated from her job if she misses any more work.  She want to undergo cardiology evaluation and CT angio but must have FMLA paperwork in place to be able to miss work without penalty.  Expressed understanding of situation and will complete paperwork.  Paperwork placed on disabilities desk.

## 2014-03-20 NOTE — Telephone Encounter (Signed)
Thank you so much Dr. Katrinka BlazingSmith.

## 2014-03-22 ENCOUNTER — Encounter: Payer: Self-pay | Admitting: Family Medicine

## 2014-03-23 DIAGNOSIS — Z0271 Encounter for disability determination: Secondary | ICD-10-CM

## 2014-03-29 ENCOUNTER — Encounter (HOSPITAL_COMMUNITY): Payer: Self-pay | Admitting: Emergency Medicine

## 2014-03-31 ENCOUNTER — Ambulatory Visit: Payer: BC Managed Care – PPO | Admitting: Obstetrics & Gynecology

## 2014-05-24 ENCOUNTER — Encounter: Payer: Self-pay | Admitting: *Deleted

## 2014-05-25 ENCOUNTER — Encounter: Payer: Self-pay | Admitting: Obstetrics & Gynecology

## 2014-06-24 ENCOUNTER — Encounter (HOSPITAL_COMMUNITY): Payer: Self-pay

## 2014-06-24 ENCOUNTER — Emergency Department (HOSPITAL_COMMUNITY)
Admission: EM | Admit: 2014-06-24 | Discharge: 2014-06-24 | Disposition: A | Discharge status: Home / Self Care | Payer: Self-pay | Attending: Emergency Medicine | Admitting: Emergency Medicine

## 2014-06-24 DIAGNOSIS — Z791 Long term (current) use of non-steroidal anti-inflammatories (NSAID): Secondary | ICD-10-CM | POA: Insufficient documentation

## 2014-06-24 DIAGNOSIS — N390 Urinary tract infection, site not specified: Secondary | ICD-10-CM

## 2014-06-24 DIAGNOSIS — Z3491 Encounter for supervision of normal pregnancy, unspecified, first trimester: Secondary | ICD-10-CM

## 2014-06-24 DIAGNOSIS — O2331 Infections of other parts of urinary tract in pregnancy, first trimester: Secondary | ICD-10-CM | POA: Insufficient documentation

## 2014-06-24 DIAGNOSIS — Z3A08 8 weeks gestation of pregnancy: Secondary | ICD-10-CM | POA: Insufficient documentation

## 2014-06-24 LAB — URINALYSIS, ROUTINE W REFLEX MICROSCOPIC
Bilirubin Urine: NEGATIVE
Glucose, UA: NEGATIVE mg/dL
HGB URINE DIPSTICK: NEGATIVE
KETONES UR: NEGATIVE mg/dL
Nitrite: POSITIVE — AB
PH: 6 (ref 5.0–8.0)
Protein, ur: NEGATIVE mg/dL
Specific Gravity, Urine: 1.034 — ABNORMAL HIGH (ref 1.005–1.030)
UROBILINOGEN UA: 0.2 mg/dL (ref 0.0–1.0)

## 2014-06-24 LAB — HCG, QUANTITATIVE, PREGNANCY: hCG, Beta Chain, Quant, S: 98504 m[IU]/mL — ABNORMAL HIGH (ref <5)

## 2014-06-24 LAB — URINE MICROSCOPIC-ADD ON

## 2014-06-24 LAB — POC URINE PREG, ED: Preg Test, Ur: POSITIVE — AB

## 2014-06-24 MED ORDER — CEPHALEXIN 500 MG PO CAPS: 500.0000 mg | ORAL_CAPSULE | Freq: Three times a day (TID) | ORAL | Status: DC

## 2014-06-24 NOTE — ED Notes (Signed)
Patient c/o RLQ pain since Sunday past.  Patient denies vaginal discharge as well as urinary frequency, urgency or hematuria.  Patient endorses nausea, but denies vomiting.  Patient denies fevers.

## 2014-06-24 NOTE — ED Notes (Signed)
Pt here with abdominal pain x 1 week.  Nausea with no vomiting.  Has hx of cyst.  Thinks she might be pregnant

## 2014-06-24 NOTE — Discharge Instructions (Signed)
Asymptomatic Bacteriuria Asymptomatic bacteriuria is the presence of a large number of bacteria in your urine without the usual symptoms of burning or frequent urination. The following conditions increase the risk of asymptomatic bacteriuria:  Diabetes mellitus.  Advanced age.  Pregnancy in the first trimester.  Kidney stones.  Kidney transplants.  Leaky kidney tube valve in young children (reflux). Treatment for this condition is not needed in most people and can lead to other problems such as too much yeast and growth of resistant bacteria. However, some people, such as pregnant women, do need treatment to prevent kidney infection. Asymptomatic bacteriuria in pregnancy is also associated with fetal growth restriction, premature labor, and newborn death. HOME CARE INSTRUCTIONS Monitor your condition for any changes. The following actions may help to relieve any discomfort you are feeling:  Drink enough water and fluids to keep your urine clear or pale yellow. Go to the bathroom more often to keep your bladder empty.  Keep the area around your vagina and rectum clean. Wipe yourself from front to back after urinating. SEEK IMMEDIATE MEDICAL CARE IF:  You develop signs of an infection such as:  Burning with urination.  Frequency of voiding.  Back pain.  Fever.  You have blood in the urine.  You develop a fever. MAKE SURE YOU:  Understand these instructions.  Will watch your condition.  Will get help right away if you are not doing well or get worse. Document Released: 05/14/2005 Document Revised: 09/28/2013 Document Reviewed: 11/03/2012 Exit[employer] Patient Information 2015 [employer]. This information is not intended to replace advice given to you by your health care provider. Make sure you discuss any questions you have with your health care provider.  

## 2014-06-24 NOTE — ED Provider Notes (Signed)
CSN: [id number]     Arrival date & time 06/24/14  1740 History   First MD Initiated Contact with Patient 06/24/14 1746     Chief Complaint  Patient presents with  . Abdominal Pain     (Consider location/radiation/quality/duration/timing/severity/associated sxs/prior Treatment) Patient is a 24 y.o. female presenting with abdominal pain. The history is provided by the patient.  Abdominal Pain Pain location: right pelvic pain. Pain quality: cramping   Pain radiates to:  Does not radiate Pain severity:  Moderate Onset quality:  Gradual Duration:  5 days Timing:  Intermittent Progression:  Unchanged Chronicity:  Recurrent Context comment:  Pt states several months ago she had an ovarian cyst and the pain she has now feels similar. Relieved by:  None tried Worsened by:  Nothing tried Ineffective treatments:  None tried Associated symptoms: anorexia and nausea   Associated symptoms: no diarrhea, no fever, no shortness of breath, no vaginal bleeding, no vaginal discharge and no vomiting   Associated symptoms comment:  LMP 05/02/14 Risk factors: no alcohol abuse, not obese and no recent hospitalization     Past Medical History  Diagnosis Date  . Medical history non-contributory    Past Surgical History  Procedure Laterality Date  . No past surgeries     Family History  Problem Relation Age of Onset  . Heart disease Father 5444    [facility]   History  Substance Use Topics  . Smoking status: Never Smoker   . Smokeless tobacco: Never Used  . Alcohol Use: No   OB History    Gravida Para Term Preterm AB TAB SAB Ectopic Multiple Living   1 1 1       1      Review of Systems  Constitutional: Negative for fever.  Respiratory: Negative for shortness of breath.   Gastrointestinal: Positive for nausea, abdominal pain and anorexia. Negative for vomiting and diarrhea.  Genitourinary: Negative for vaginal bleeding and vaginal discharge.  All other systems reviewed and are  negative.     Allergies  Review of patient's allergies indicates no known allergies.  Home Medications   Prior to Admission medications   Medication Sig Start Date End Date Taking? Authorizing Provider  ibuprofen (ADVIL,MOTRIN) 200 MG tablet Take 200 mg by mouth every 6 (six) hours as needed for headache.   Yes Historical Provider, MD  Acetaminophen-Caff-Pyrilamine (MIDOL COMPLETE) 500-60-15 MG TABS Take 2 tablets by mouth every 4 (four) hours as needed (for pain.).    Historical Provider, MD  HYDROcodone-acetaminophen (NORCO) 5-325 MG per tablet Take 1 tablet by mouth every 6 (six) hours as needed for severe pain. Patient not taking: Reported on 06/24/2014 03/13/14   Donnita FallsMercedes Strupp Camprubi-Soms, PA-C  metroNIDAZOLE (FLAGYL) 500 MG tablet Take 1 tablet (500 mg total) by mouth 2 (two) times daily. One po bid x 7 days Patient not taking: Reported on 06/24/2014 03/13/14   Donnita FallsMercedes Strupp Camprubi-Soms, PA-C  naproxen (NAPROSYN) 500 MG tablet Take 1 tablet (500 mg total) by mouth 2 (two) times daily as needed for mild pain, moderate pain or headache (TAKE WITH MEALS.). Patient not taking: Reported on 06/24/2014 03/13/14   Donnita FallsMercedes Strupp Camprubi-Soms, PA-C  naproxen sodium (PAMPRIN ALL DAY RELIEF MAX ST) 220 MG tablet Take 440 mg by mouth 2 (two) times daily as needed (for pain.).    Historical Provider, MD   BP 134/80 mmHg  Pulse 84  Temp(Src) 98.5 F (36.9 C) (Oral)  Resp 18  SpO2 100%  LMP 05/02/2014 Physical Exam  Constitutional: She is oriented to person, place, and time. She appears well-developed and well-nourished. No distress.  HENT:  Head: Normocephalic and atraumatic.  Mouth/Throat: Oropharynx is clear and moist.  Eyes: Conjunctivae and EOM are normal. Pupils are equal, round, and reactive to light.  Neck: Normal range of motion. Neck supple.  Cardiovascular: Normal rate, regular rhythm and intact distal pulses.   No murmur heard. Pulmonary/Chest: Effort normal and breath  sounds normal. No respiratory distress. She has no wheezes. She has no rales.  Abdominal: Soft. Normal appearance. She exhibits no distension. There is no tenderness. There is no rebound, no guarding and no CVA tenderness.    Moderate right pelvic tenderness and mild left pelvic tenderness  Genitourinary: Vagina normal. Uterus is enlarged. Cervix exhibits no motion tenderness, no discharge and no friability. Right adnexum displays tenderness. Right adnexum displays no mass and no fullness. Left adnexum displays no mass, no tenderness and no fullness.  Musculoskeletal: Normal range of motion. She exhibits no edema or tenderness.  Neurological: She is alert and oriented to person, place, and time.  Skin: Skin is warm and dry. No rash noted. No erythema.  Psychiatric: She has a normal mood and affect. Her behavior is normal.  Nursing note and vitals reviewed.   ED Course  Procedures (including critical care time) Labs Review Labs Reviewed  URINALYSIS, ROUTINE W REFLEX MICROSCOPIC - Abnormal; Notable for the following:    APPearance CLOUDY (*)    Specific Gravity, Urine 1.034 (*)    Nitrite POSITIVE (*)    Leukocytes, UA SMALL (*)    All other components within normal limits  URINE MICROSCOPIC-ADD ON - Abnormal; Notable for the following:    Squamous Epithelial / LPF FEW (*)    Bacteria, UA MANY (*)    All other components within normal limits  POC URINE PREG, ED - Abnormal; Notable for the following:    Preg Test, Ur POSITIVE (*)    All other components within normal limits  GONOCOCCUS CULTURE  HCG, QUANTITATIVE, PREGNANCY  ABO/RH  GC/CHLAMYDIA PROBE AMP ([place])    Imaging Review No results found.   EKG Interpretation None       EMERGENCY DEPARTMENT [place] PREGNANCY "Study: Limited Ultrasound of the Pelvis"  INDICATIONS:Pelvic pain Multiple views of the uterus and pelvic cavity are obtained with a multi-frequency probe.  APPROACH:Transvaginal  PERFORMED BY:  Myself  IMAGES ARCHIVED?: Yes  LIMITATIONS: none  PREGNANCY FREE FLUID: None  PREGNANCY UTERUS FINDINGS:Uterus enlarged and Gestational sac noted ADNEXAL FINDINGS:Left ovary not seen, right ovary not seen  PREGNANCY FINDINGS: Intrauterine gestational sac noted  INTERPRETATION: Viable intrauterine pregnancy  GESTATIONAL AGE, ESTIMATE: 8w  FETAL HEART RATE: 176  COMMENT(Estimate of Gestational Age):  Normal IUP     MDM   Final diagnoses:  First trimester pregnancy  UTI (lower urinary tract infection)    Patient with right pelvic pain for the last 5 days that's been intermittent. She denies any vaginal discharge or urinary symptoms. Also her last menstrual cycle was 05/02/2014.  Concern for possible pregnancy versus ectopic pregnancy versus adnexal cyst.  UA, UPT  7:10 PM  On pelvic exam patient did have right adnexal tenderness however bedside transvaginal ultrasound showed an intrauterine pregnancy at approximately 8 weeks without complicating features.  Patient is B+ with evidence of urinary tract infection. Will treat with antibiotic and give him follow-up with OB/GYN  Gwyneth Sprout, MD 06/24/14 [id number]

## 2014-06-25 ENCOUNTER — Ambulatory Visit: Payer: Self-pay | Admitting: Obstetrics

## 2014-06-25 LAB — GC/CHLAMYDIA PROBE AMP (~~LOC~~) NOT AT ARMC
Chlamydia: NEGATIVE
Neisseria Gonorrhea: NEGATIVE

## 2014-06-25 LAB — ABO/RH: ABO/RH(D): B POS

## 2014-06-27 LAB — GONOCOCCUS CULTURE
Culture: NO GROWTH
SPECIAL REQUESTS: NORMAL

## 2014-12-20 ENCOUNTER — Encounter (HOSPITAL_COMMUNITY): Payer: Self-pay

## 2014-12-20 ENCOUNTER — Emergency Department (HOSPITAL_COMMUNITY)
Admission: EM | Admit: 2014-12-20 | Discharge: 2014-12-20 | Disposition: A | Discharge status: Home / Self Care | Payer: Medicaid Other | Attending: Emergency Medicine | Admitting: Emergency Medicine

## 2014-12-20 ENCOUNTER — Emergency Department (HOSPITAL_COMMUNITY): Payer: Medicaid Other

## 2014-12-20 DIAGNOSIS — R109 Unspecified abdominal pain: Secondary | ICD-10-CM | POA: Diagnosis present

## 2014-12-20 DIAGNOSIS — R1030 Lower abdominal pain, unspecified: Secondary | ICD-10-CM | POA: Diagnosis not present

## 2014-12-20 DIAGNOSIS — Z331 Pregnant state, incidental: Secondary | ICD-10-CM | POA: Diagnosis not present

## 2014-12-20 DIAGNOSIS — O26899 Other specified pregnancy related conditions, unspecified trimester: Secondary | ICD-10-CM

## 2014-12-20 LAB — BASIC METABOLIC PANEL
Anion gap: 5 (ref 5–15)
BUN: 10 mg/dL (ref 6–20)
CALCIUM: 8.7 mg/dL — AB (ref 8.9–10.3)
CO2: 22 mmol/L (ref 22–32)
Chloride: 108 mmol/L (ref 101–111)
Creatinine, Ser: 0.78 mg/dL (ref 0.44–1.00)
GFR calc Af Amer: >60 mL/min (ref >60)
GFR calc non Af Amer: >60 mL/min (ref >60)
Glucose, Bld: 119 mg/dL — ABNORMAL HIGH (ref 65–99)
POTASSIUM: 3.6 mmol/L (ref 3.5–5.1)
SODIUM: 135 mmol/L (ref 135–145)

## 2014-12-20 LAB — PREGNANCY, URINE: PREG TEST UR: POSITIVE — AB

## 2014-12-20 LAB — CBC WITH DIFFERENTIAL/PLATELET
Basophils Absolute: 0 10*3/uL (ref 0.0–0.1)
Basophils Relative: 0 % (ref 0–1)
Eosinophils Absolute: 0.3 10*3/uL (ref 0.0–0.7)
Eosinophils Relative: 3 % (ref 0–5)
HCT: 41.3 % (ref 36.0–46.0)
Hemoglobin: 14 g/dL (ref 12.0–15.0)
Lymphocytes Relative: 25 % (ref 12–46)
Lymphs Abs: 2.6 10*3/uL (ref 0.7–4.0)
MCH: 29.5 pg (ref 26.0–34.0)
MCHC: 33.9 g/dL (ref 30.0–36.0)
MCV: 87.1 fL (ref 78.0–100.0)
Monocytes Absolute: 0.5 10*3/uL (ref 0.1–1.0)
Monocytes Relative: 5 % (ref 3–12)
Neutro Abs: 6.7 10*3/uL (ref 1.7–7.7)
Neutrophils Relative %: 67 % (ref 43–77)
Platelets: 287 10*3/uL (ref 150–400)
RBC: 4.74 MIL/uL (ref 3.87–5.11)
RDW: 12.5 % (ref 11.5–15.5)
WBC: 10.1 10*3/uL (ref 4.0–10.5)

## 2014-12-20 LAB — URINALYSIS, ROUTINE W REFLEX MICROSCOPIC
Bilirubin Urine: NEGATIVE
Glucose, UA: NEGATIVE mg/dL
HGB URINE DIPSTICK: NEGATIVE
Ketones, ur: NEGATIVE mg/dL
LEUKOCYTES UA: NEGATIVE
NITRITE: NEGATIVE
PH: 6.5 (ref 5.0–8.0)
PROTEIN: NEGATIVE mg/dL
Specific Gravity, Urine: 1.012 (ref 1.005–1.030)
Urobilinogen, UA: 0.2 mg/dL (ref 0.0–1.0)

## 2014-12-20 LAB — WET PREP, GENITAL
Clue Cells Wet Prep HPF POC: NONE SEEN
Trich, Wet Prep: NONE SEEN
Yeast Wet Prep HPF POC: NONE SEEN

## 2014-12-20 LAB — HCG, QUANTITATIVE, PREGNANCY: HCG, BETA CHAIN, QUANT, S: 66 m[IU]/mL — AB (ref <5)

## 2014-12-20 MED ORDER — ACETAMINOPHEN 500 MG PO TABS: 500.0000 mg | ORAL_TABLET | Freq: Four times a day (QID) | ORAL | Status: DC | PRN

## 2014-12-20 NOTE — ED Notes (Signed)
Pt verbalized dc instructions

## 2014-12-20 NOTE — Discharge Instructions (Signed)
Your blood work estimates that you are [redacted] weeks pregnant. We are unable to rule out an ectopic pregnancy (a pregnancy in your fallopian tube/wrong position). Follow up at [facility] in 2 days for a repeat hCG level. You may also need a repeat ultrasound at this time. Follow up with [facility] if symptoms worsen. Take Tylenol as needed for pain.  Abdominal Pain During Pregnancy Abdominal pain is common in pregnancy. Most of the time, it does not cause harm. There are many causes of abdominal pain. Some causes are more serious than others. Some of the causes of abdominal pain in pregnancy are easily diagnosed. Occasionally, the diagnosis takes time to understand. Other times, the cause is not determined. Abdominal pain can be a sign that something is very wrong with the pregnancy, or the pain may have nothing to do with the pregnancy at all. For this reason, always tell your health care provider if you have any abdominal discomfort. HOME CARE INSTRUCTIONS  Monitor your abdominal pain for any changes. The following actions may help to alleviate any discomfort you are experiencing:  Do not have sexual intercourse or put anything in your vagina until your symptoms go away completely.  Get plenty of rest until your pain improves.  Drink clear fluids if you feel nauseous. Avoid solid food as long as you are uncomfortable or nauseous.  Only take over-the-counter or prescription medicine as directed by your health care provider.  Keep all follow-up appointments with your health care provider. SEEK IMMEDIATE MEDICAL CARE IF:  You are bleeding, leaking fluid, or passing tissue from the vagina.  You have increasing pain or cramping.  You have persistent vomiting.  You have painful or bloody urination.  You have a fever.  You notice a decrease in your baby's movements.  You have extreme weakness or feel faint.  You have shortness of breath, with or without abdominal  pain.  You develop a severe headache with abdominal pain.  You have abnormal vaginal discharge with abdominal pain.  You have persistent diarrhea.  You have abdominal pain that continues even after rest, or gets worse. MAKE SURE YOU:   Understand these instructions.  Will watch your condition.  Will get help right away if you are not doing well or get worse. Document Released: 05/14/2005 Document Revised: 03/04/2013 Document Reviewed: 12/11/2012 [facility] Patient Information 2015 [place], [place]. This information is not intended to replace advice given to you by your health care provider. Make sure you discuss any questions you have with your health care provider.

## 2014-12-20 NOTE — ED Notes (Addendum)
Pt c/o intermittent bilateral lower abdominal pain x 2 weeks.  Pain score 6/10.  Pt reports that drinking water "makes it somewhat better."  Denies n/v/d.  Denies GU complaints.  LMP 11/09/14.  Pregnancy is possible.

## 2014-12-20 NOTE — ED Provider Notes (Signed)
CSN: [id number]     Arrival date & time 12/20/14  4098 History   First MD Initiated Contact with Patient 12/20/14 [id number]     Chief Complaint  Patient presents with  . Abdominal Pain    (Consider location/radiation/quality/duration/timing/severity/associated sxs/prior Treatment) HPI Comments: 24 year old G2P1 female presents to the emergency department for further evaluation of intermittent abdominal cramping 2 weeks. Patient states that she has been having cramping in her bilateral inguinal region. She has not taken anything for symptoms and denies any aggravating or alleviating factors of her cramping. She denies the use of birth control and states that her last menstrual period was on 11/09/2014. Patient underwent an elective abortion following a pregnancy which was discovered during an ED visit in January. She has had no associated nausea, vomiting, fever, vaginal bleeding, vaginal discharge, dysuria, or hematuria associated with her symptoms over the past 2 weeks. No history of abdominal surgeries.  Patient is a 24 y.o. female presenting with abdominal pain. The history is provided by the patient. No language interpreter was used.  Abdominal Pain   Past Medical History  Diagnosis Date  . Medical history non-contributory    Past Surgical History  Procedure Laterality Date  . No past surgeries     Family History  Problem Relation Age of Onset  . Heart disease Father 15    [facility]   History  Substance Use Topics  . Smoking status: Never Smoker   . Smokeless tobacco: Never Used  . Alcohol Use: Yes     Comment: occ   OB History    Gravida Para Term Preterm AB TAB SAB Ectopic Multiple Living   Review of Systems  Gastrointestinal: Positive for abdominal pain.  All other systems reviewed and are negative.   Allergies  Review of patient's allergies indicates no known allergies.  Home Medications   Prior to Admission medications   Medication Sig Start Date  End Date Taking? Authorizing Provider  cephALEXin (KEFLEX) 500 MG capsule Take 1 capsule (500 mg total) by mouth 3 (three) times daily. Patient not taking: Reported on 12/20/2014 06/24/14   Gwyneth Sprout, MD  HYDROcodone-acetaminophen (NORCO) 5-325 MG per tablet Take 1 tablet by mouth every 6 (six) hours as needed for severe pain. Patient not taking: Reported on 06/24/2014 03/13/14   Mercedes Camprubi-Soms, PA-C  metroNIDAZOLE (FLAGYL) 500 MG tablet Take 1 tablet (500 mg total) by mouth 2 (two) times daily. One po bid x 7 days Patient not taking: Reported on 06/24/2014 03/13/14   Mercedes Camprubi-Soms, PA-C  naproxen (NAPROSYN) 500 MG tablet Take 1 tablet (500 mg total) by mouth 2 (two) times daily as needed for mild pain, moderate pain or headache (TAKE WITH MEALS.). Patient not taking: Reported on 06/24/2014 03/13/14   Mercedes Camprubi-Soms, PA-C   BP 158/88 mmHg  Pulse 118  Temp(Src) 99.4 F (37.4 C) (Oral)  Resp 16  SpO2 100%  LMP 11/09/2014   Physical Exam  Constitutional: She is oriented to person, place, and time. She appears well-developed and well-nourished. No distress.  Nontoxic appearing. Patient in no visible or audible discomfort.  HENT:  Head: Normocephalic and atraumatic.  Eyes: Conjunctivae and EOM are normal. No scleral icterus.  Neck: Normal range of motion.  Cardiovascular: Normal rate, regular rhythm and intact distal pulses.   Pulmonary/Chest: Effort normal and breath sounds normal. No respiratory distress. She has no wheezes. She has no rales.  Lungs clear  to auscultation bilaterally  Abdominal: Soft. She exhibits no distension. There is tenderness in the suprapubic area. There is no rebound and no guarding.    Abdomen is soft with tenderness to the suprapubic region. No masses or peritoneal signs noted.  Genitourinary: There is no rash, tenderness, lesion or injury on the right labia. There is no rash, tenderness, lesion or injury on the left labia. Uterus is  tender. Cervix exhibits no motion tenderness and no friability. Right adnexum displays tenderness. Right adnexum displays no mass and no fullness. Left adnexum displays tenderness. Left adnexum displays no mass and no fullness. No bleeding in the vagina. No vaginal discharge found.  Mild TTP to uterus and b/l adnexa. No adnexal fullness. No CMT. Cervical os closed.  Musculoskeletal: Normal range of motion.  Neurological: She is alert and oriented to person, place, and time. She exhibits normal muscle tone. Coordination normal.  Skin: Skin is warm and dry. No rash noted. She is not diaphoretic. No erythema. No pallor.  Psychiatric: She has a normal mood and affect. Her behavior is normal.  Nursing note and vitals reviewed.   ED Course  Procedures (including critical care time) Labs Review Labs Reviewed  WET PREP, GENITAL - Abnormal; Notable for the following:    WBC, Wet Prep HPF POC FEW (*)    All other components within normal limits  HCG, QUANTITATIVE, PREGNANCY - Abnormal; Notable for the following:    hCG, Beta Chain, Quant, S 66 (*)    All other components within normal limits  PREGNANCY, URINE - Abnormal; Notable for the following:    Preg Test, Ur POSITIVE (*)    All other components within normal limits  URINALYSIS, ROUTINE W REFLEX MICROSCOPIC (NOT AT [facility]) - Abnormal; Notable for the following:    APPearance CLOUDY (*)    All other components within normal limits  BASIC METABOLIC PANEL - Abnormal; Notable for the following:    Glucose, Bld 119 (*)    Calcium 8.7 (*)    All other components within normal limits  CBC WITH DIFFERENTIAL/PLATELET  GC/CHLAMYDIA PROBE AMP ([facility]) NOT AT [facility]    Imaging Review US Ob Comp Less 14 Wks  12/20/2014   CLINICAL DATA:  Pelvic pain for 3 weeks.  Quantitative beta HCG 66.  EXAM: OBSTETRIC <14 WK [place] AND TRANSVAGINAL OB US  TECHNIQUE: Both transabdominal and transvaginal ultrasound examinations were performed for complete evaluation  of the gestation as well as the maternal uterus, adnexal regions, and pelvic cul-de-sac. Transvaginal technique was performed to assess early pregnancy.  COMPARISON:  None.  FINDINGS: Intrauterine gestational sac: None.  Yolk sac:  Not applicable.  Embryo:  Not applicable.  Cardiac Activity: Not applicable.  Heart Rate: Not applicable.  Bpm  Maternal uterus/adnexae: The RIGHT ovary appears normal. In the substance of LEFT ovary, there is a solid-appearing echogenic lesion measuring 20 mm x 19 mm x 16 mm. No gestational sac or embryo is identified however this lesion does show internal vascular flow.  Significantly, no hemoperitoneum.  IMPRESSION: 1. No intrauterine gestational sac identified. Given quantitative beta HCG of 66, this is probably secondary to early dates. The differential considerations are early normal or abnormal intrauterine pregnancy but ectopic pregnancy cannot be excluded. 2. Approximately 2 centimeter solid RIGHT intra-ovarian lesion with vascular flow is nonspecific but could potentially represent an ovarian ectopic pregnancy.   Electronically Signed   By: Andreas Newport M.D.   On: 12/20/2014 12:02   US Ob Transvaginal  12/20/2014  CLINICAL DATA:  Pelvic pain for 3 weeks.  Quantitative beta HCG 66.  EXAM: OBSTETRIC <14 WK [place] AND TRANSVAGINAL OB US  TECHNIQUE: Both transabdominal and transvaginal ultrasound examinations were performed for complete evaluation of the gestation as well as the maternal uterus, adnexal regions, and pelvic cul-de-sac. Transvaginal technique was performed to assess early pregnancy.  COMPARISON:  None.  FINDINGS: Intrauterine gestational sac: None.  Yolk sac:  Not applicable.  Embryo:  Not applicable.  Cardiac Activity: Not applicable.  Heart Rate: Not applicable.  Bpm  Maternal uterus/adnexae: The RIGHT ovary appears normal. In the substance of LEFT ovary, there is a solid-appearing echogenic lesion measuring 20 mm x 19 mm x 16 mm. No gestational sac or embryo is  identified however this lesion does show internal vascular flow.  Significantly, no hemoperitoneum.  IMPRESSION: 1. No intrauterine gestational sac identified. Given quantitative beta HCG of 66, this is probably secondary to early dates. The differential considerations are early normal or abnormal intrauterine pregnancy but ectopic pregnancy cannot be excluded. 2. Approximately 2 centimeter solid RIGHT intra-ovarian lesion with vascular flow is nonspecific but could potentially represent an ovarian ectopic pregnancy.   Electronically Signed   By: Andreas Newport M.D.   On: 12/20/2014 12:02     EKG Interpretation None      MDM   Final diagnoses:  Abdominal pain in pregnancy    24 year old female presents to the emergency department for evaluation of lower abdominal cramping. Patient found to be pregnant on workup today. HCG is consistent with a pregnancy of approximately 2 weeks. As a result, ultrasound unable to visualize intrauterine gestational sac or definitive ectopic pregnancy. Patient has no leukocytosis or left shift today. Chemistry and urinalysis are also noncontributory. Abdominal cramping may be secondary to uncomplicated pain during pregnancy vs ectopic pregnancy. I have discussed with the patient our inability to rule out an ectopic pregnancy today. Patient advised to follow-up with the women's outpatient clinic in 48 hours for a repeat hCG level and, potential, repeat imaging. Tylenol advised for pain control. Return precautions discussed and provided. Patient agreeable to plan with no unaddressed concerns. Patient discharged in good condition; VSS.   Filed Vitals:   12/20/14 0955 12/20/14 1228  BP: 158/88 125/72  Pulse: 118 68  Temp: 99.4 F (37.4 C) 98.3 F (36.8 C)  TempSrc: Oral Oral  Resp: 16 14  SpO2: 100% 100%     Antony Madura, PA-C 12/20/14 1302  Azalia Bilis, MD 12/20/14 [phone]

## 2014-12-21 LAB — GC/CHLAMYDIA PROBE AMP (~~LOC~~) NOT AT ARMC
Chlamydia: POSITIVE — AB
NEISSERIA GONORRHEA: NEGATIVE

## 2014-12-22 ENCOUNTER — Other Ambulatory Visit: Payer: Self-pay

## 2014-12-22 ENCOUNTER — Telehealth: Payer: Self-pay | Admitting: Emergency Medicine

## 2014-12-22 DIAGNOSIS — O209 Hemorrhage in early pregnancy, unspecified: Secondary | ICD-10-CM

## 2014-12-22 NOTE — Telephone Encounter (Signed)
Positive Chlamydia culture Chart sent to EDP for review 

## 2014-12-23 ENCOUNTER — Inpatient Hospital Stay (HOSPITAL_COMMUNITY)
Admission: AD | Admit: 2014-12-23 | Discharge: 2014-12-23 | Disposition: A | Discharge status: Home / Self Care | Payer: Medicaid Other | Source: Ambulatory Visit | Attending: Obstetrics & Gynecology | Admitting: Obstetrics & Gynecology

## 2014-12-23 ENCOUNTER — Encounter (HOSPITAL_COMMUNITY): Payer: Self-pay | Admitting: *Deleted

## 2014-12-23 DIAGNOSIS — Z3201 Encounter for pregnancy test, result positive: Secondary | ICD-10-CM | POA: Diagnosis not present

## 2014-12-23 DIAGNOSIS — Z3200 Encounter for pregnancy test, result unknown: Secondary | ICD-10-CM | POA: Diagnosis present

## 2014-12-23 DIAGNOSIS — N926 Irregular menstruation, unspecified: Secondary | ICD-10-CM

## 2014-12-23 LAB — HCG, QUANTITATIVE, PREGNANCY: HCG, BETA CHAIN, QUANT, S: 201.2 m[IU]/mL

## 2014-12-23 NOTE — MAU Provider Note (Signed)
Subjective:  [name] is a 24 y.o. femela G2P1001 at 102w2d here in MAU for a pregnancy verification letter. She found out 2 days ago at [facility] that she was pregnant and now she needs a letter for insurance puproses.  She currently denies pain or bleeding.   Objective:  GENERAL: Well-developed, well-nourished female in no acute distress.  LUNGS: Effort normal SKIN: Warm, dry and without erythema PSYCH: Normal mood and affect  Filed Vitals:   12/23/14 1229  BP: 132/70  Pulse: 87  Temp: 98.8 F (37.1 C)  TempSrc: Oral  Resp: 16  Height:  (1.549 m)  Weight: 68.947 kg (152 lb)    Results for orders placed or performed in visit on 12/22/14 (from the past 72 hour(s))  B-HCG Quant     Status: None   Collection Time: 12/22/14  3:56 PM  Result Value Ref Range   hCG, Beta Chain, Quant, S 201.2 mIU/mL    Comment:    Males and non-pregnant females       < 5   mIU/mL  Gestation Age               Concentration (mIU/mL)    <= 1 Week                       5 - 50       2 Weeks                     50 - 500       3 Weeks                    100 - 10,000       4 Weeks                  1,000 - 30,000       5 Weeks                  3,500 - 115,000     6-8 Weeks                 12,000 - 270,000      12 Weeks                 15,000 - 220,000     Assessment:  1. Missed period   2. Pregnancy test positive    Plan:   Discharge home in stable condition First trimester warning signs discussed Return to MAU for any pain or bleeding  Pregnancy verification letter given   Duane Lope, NP  12/23/2014 5:29 PM

## 2014-12-23 NOTE — MAU Note (Signed)
Was at [facility], found out she was preg; she had done a home test.  Needs letter for medicaid.  Denies any problems

## 2014-12-25 ENCOUNTER — Telehealth: Payer: Self-pay | Admitting: Emergency Medicine

## 2014-12-25 NOTE — Telephone Encounter (Signed)
Post ED Visit - Positive Culture Follow-up: Successful Patient Follow-Up  Positive Chlamydia culture   Patient discharged without antimicrobial prescription and treatment is now indicated  Organism is resistant to prescribed ED discharge antimicrobial  Patient with positive blood cultures  Changes discussed with ED provider: Gwyneth Sprout New antibiotic prescription:  Azithromycin 1000 mg PO x once Called to [facility] [phone]  Contacted patient, date 12/25/14, time 1355 ID verified x three. Patient notified of positive Chlamydia and need for treatment. STD instructions provided, patient verbalized understanding. RX Chlamydia called to [facility] [phone].  Jiles Harold 12/25/2014, 2:02 PM

## 2014-12-26 ENCOUNTER — Encounter (HOSPITAL_COMMUNITY): Payer: Self-pay | Admitting: *Deleted

## 2014-12-26 ENCOUNTER — Inpatient Hospital Stay (HOSPITAL_COMMUNITY)
Admission: AD | Admit: 2014-12-26 | Discharge: 2014-12-26 | Disposition: A | Discharge status: Home / Self Care | Payer: Medicaid Other | Source: Ambulatory Visit | Attending: Family Medicine | Admitting: Family Medicine

## 2014-12-26 ENCOUNTER — Inpatient Hospital Stay (HOSPITAL_COMMUNITY): Payer: Medicaid Other

## 2014-12-26 DIAGNOSIS — Z3A01 Less than 8 weeks gestation of pregnancy: Secondary | ICD-10-CM | POA: Diagnosis not present

## 2014-12-26 DIAGNOSIS — O26899 Other specified pregnancy related conditions, unspecified trimester: Secondary | ICD-10-CM

## 2014-12-26 DIAGNOSIS — O3680X0 Pregnancy with inconclusive fetal viability, not applicable or unspecified: Secondary | ICD-10-CM

## 2014-12-26 DIAGNOSIS — O98311 Other infections with a predominantly sexual mode of transmission complicating pregnancy, first trimester: Secondary | ICD-10-CM | POA: Insufficient documentation

## 2014-12-26 DIAGNOSIS — A568 Sexually transmitted chlamydial infection of other sites: Secondary | ICD-10-CM | POA: Insufficient documentation

## 2014-12-26 DIAGNOSIS — A749 Chlamydial infection, unspecified: Secondary | ICD-10-CM

## 2014-12-26 DIAGNOSIS — R109 Unspecified abdominal pain: Secondary | ICD-10-CM | POA: Diagnosis present

## 2014-12-26 DIAGNOSIS — O98811 Other maternal infectious and parasitic diseases complicating pregnancy, first trimester: Secondary | ICD-10-CM

## 2014-12-26 LAB — URINALYSIS, ROUTINE W REFLEX MICROSCOPIC
BILIRUBIN URINE: NEGATIVE
Glucose, UA: NEGATIVE mg/dL
HGB URINE DIPSTICK: NEGATIVE
KETONES UR: NEGATIVE mg/dL
Leukocytes, UA: NEGATIVE
NITRITE: NEGATIVE
PROTEIN: NEGATIVE mg/dL
SPECIFIC GRAVITY, URINE: 1.02 (ref 1.005–1.030)
Urobilinogen, UA: 1 mg/dL (ref 0.0–1.0)
pH: 7.5 (ref 5.0–8.0)

## 2014-12-26 LAB — CBC
HCT: 39.5 % (ref 36.0–46.0)
HEMOGLOBIN: 13.2 g/dL (ref 12.0–15.0)
MCH: 29.7 pg (ref 26.0–34.0)
MCHC: 33.4 g/dL (ref 30.0–36.0)
MCV: 88.8 fL (ref 78.0–100.0)
Platelets: 294 10*3/uL (ref 150–400)
RBC: 4.45 MIL/uL (ref 3.87–5.11)
RDW: 12.9 % (ref 11.5–15.5)
WBC: 15.2 10*3/uL — ABNORMAL HIGH (ref 4.0–10.5)

## 2014-12-26 LAB — HCG, QUANTITATIVE, PREGNANCY: hCG, Beta Chain, Quant, S: 1243 m[IU]/mL — ABNORMAL HIGH (ref <5)

## 2014-12-26 MED ORDER — AZITHROMYCIN 250 MG PO TABS
1000.0000 mg | ORAL_TABLET | Freq: Once | ORAL | Status: AC
Start: 2014-12-26 — End: 2014-12-26
  Administered 2014-12-26: 1000 mg via ORAL
  Filled 2014-12-26: qty 4

## 2014-12-26 MED ORDER — ACETAMINOPHEN 500 MG PO TABS: 1000.0000 mg | ORAL_TABLET | Freq: Four times a day (QID) | ORAL | Status: DC | PRN

## 2014-12-26 NOTE — Progress Notes (Signed)
Lisa Leftwich-Kirby CNM in earlier to discuss test results and d/c plan. Written and verbal d/c instructions given and understanding voiced. 

## 2014-12-26 NOTE — MAU Provider Note (Signed)
Chief Complaint: Abdominal Pain   First Provider Initiated Contact with Patient 12/26/14 0105      SUBJECTIVE HPI: Kayla [name] is a 24 y.o. G2P1001 at [redacted]w[redacted]d by LMP who presents to maternity admissions reporting abdominal cramping and sharp "needle-like" pain in her lower abdomen for about a week worsening today.  She was seen in MAU 07/22/14 and in WOC for quant hcg on 07/24/14. She reports she did not hear from the clinic about a scheduled lab or ultrasound and does not know what she should do next.  She was called and Rx sent to pharmacy to treat positive chlamydia but she has not picked up medication.  She denies vaginal bleeding, vaginal itching/burning, urinary symptoms, h/a, dizziness, n/v, or fever/chills.    Quant hcg results: 7/25:  66 7/27:  201  Abdominal Pain This is a new problem. The current episode started in the past 7 days. The onset quality is gradual. The problem occurs intermittently. The most recent episode lasted 1 week. The problem has been gradually worsening. The pain is located in the LLQ, RLQ and suprapubic region. The pain is moderate. The quality of the pain is cramping and sharp. The abdominal pain does not radiate. Pertinent negatives include no constipation, diarrhea, dysuria, fever, frequency, headaches, nausea or vomiting. She has tried acetaminophen for the symptoms. The treatment provided no relief.    Past Medical History  Diagnosis Date  . Medical history non-contributory    Past Surgical History  Procedure Laterality Date  . No past surgeries     History   Social History  . Marital Status: Single    Spouse Name: N/A  . Number of Children: N/A  . Years of Education: N/A   Occupational History  . Not on file.   Social History Main Topics  . Smoking status: Never Smoker   . Smokeless tobacco: Never Used  . Alcohol Use: Yes     Comment: occ  . Drug Use: No  . Sexual Activity:    Partners: Male    Birth Control/ Protection: None   Other  Topics Concern  . Not on file   Social History Narrative   Marital status: single; not dating.     Children: 1 son (3yo)      Lives: with mom      Employment: Research officer, political party; moderately happy.      Education: [employer].      Tobacco: none      Alcohol: none      Drugs: none      Exercise:  None; active.         No current facility-administered medications on file prior to encounter.   No current outpatient prescriptions on file prior to encounter.   No Known Allergies  Review of Systems  Constitutional: Negative for fever, chills and malaise/fatigue.  Eyes: Negative for blurred vision.  Respiratory: Negative for cough and shortness of breath.   Cardiovascular: Negative for chest pain.  Gastrointestinal: Positive for abdominal pain. Negative for heartburn, nausea, vomiting, diarrhea and constipation.  Genitourinary: Negative for dysuria, urgency and frequency.  Musculoskeletal: Negative.   Neurological: Negative for dizziness and headaches.  Psychiatric/Behavioral: Negative for depression.    OBJECTIVE Blood pressure 138/88, pulse 83, temperature 98.8 F (37.1 C), resp. rate 18, height 5\' 1"  (1.549 m), weight 68.493 kg (151 lb), last menstrual period 11/09/2014. GENERAL: Well-developed, well-nourished female in no acute distress.  EYES: normal sclera/conjunctiva; no lid-lag HENT: Atraumatic, normocephalic  HEART: normal rate RESP: normal effort ABDOMEN: Soft, non-tender MUSCULOSKELETAL: Normal ROM EXTREMITIES: Nontender, no edema NEURO/PSYCH: Alert and oriented, appropriate affect  PELVIC EXAM: Cervix pink, visually closed, without lesion, scant white creamy discharge, vaginal walls and external genitalia normal Bimanual exam: Cervix 0/long/high, firm, anterior, neg CMT, uterus nontender, nonenlarged, adnexa without tenderness, enlargement, or mass   LAB RESULTS Results for orders placed or performed during the hospital  encounter of 12/26/14 (from the past 24 hour(s))  Urinalysis, Routine w reflex microscopic (not at [facility])     Status: None   Collection Time: 12/26/14 12:25 AM  Result Value Ref Range   Color, Urine YELLOW YELLOW   APPearance CLEAR CLEAR   Specific Gravity, Urine 1.020 1.005 - 1.030   pH 7.5 5.0 - 8.0   Glucose, UA NEGATIVE NEGATIVE mg/dL   Hgb urine dipstick NEGATIVE NEGATIVE   Bilirubin Urine NEGATIVE NEGATIVE   Ketones, ur NEGATIVE NEGATIVE mg/dL   Protein, ur NEGATIVE NEGATIVE mg/dL   Urobilinogen, UA 1.0 0.0 - 1.0 mg/dL   Nitrite NEGATIVE NEGATIVE   Leukocytes, UA NEGATIVE NEGATIVE  CBC     Status: Abnormal   Collection Time: 12/26/14 12:40 AM  Result Value Ref Range   WBC 15.2 (H) 4.0 - 10.5 K/uL   RBC 4.45 3.87 - 5.11 MIL/uL   Hemoglobin 13.2 12.0 - 15.0 g/dL   HCT 16.1 09.6 - 04.5 %   MCV 88.8 78.0 - 100.0 fL   MCH 29.7 26.0 - 34.0 pg   MCHC 33.4 30.0 - 36.0 g/dL   RDW 40.9 81.1 - 91.4 %   Platelets 294 150 - 400 K/uL  hCG, quantitative, pregnancy     Status: Abnormal   Collection Time: 12/26/14 12:40 AM  Result Value Ref Range   hCG, Beta Chain, Quant, S 1243 (H) <5 mIU/mL    --/--/B POS (01/28 1835)  IMAGING US Ob Comp Less 14 Wks  12/20/2014   CLINICAL DATA:  Pelvic pain for 3 weeks.  Quantitative beta HCG 66.  EXAM: OBSTETRIC <14 WK [place] AND TRANSVAGINAL OB US  TECHNIQUE: Both transabdominal and transvaginal ultrasound examinations were performed for complete evaluation of the gestation as well as the maternal uterus, adnexal regions, and pelvic cul-de-sac. Transvaginal technique was performed to assess early pregnancy.  COMPARISON:  None.  FINDINGS: Intrauterine gestational sac: None.  Yolk sac:  Not applicable.  Embryo:  Not applicable.  Cardiac Activity: Not applicable.  Heart Rate: Not applicable.  Bpm  Maternal uterus/adnexae: The RIGHT ovary appears normal. In the substance of LEFT ovary, there is a solid-appearing echogenic lesion measuring 20 mm x 19 mm x 16 mm.  No gestational sac or embryo is identified however this lesion does show internal vascular flow.  Significantly, no hemoperitoneum.  IMPRESSION: 1. No intrauterine gestational sac identified. Given quantitative beta HCG of 66, this is probably secondary to early dates. The differential considerations are early normal or abnormal intrauterine pregnancy but ectopic pregnancy cannot be excluded. 2. Approximately 2 centimeter solid RIGHT intra-ovarian lesion with vascular flow is nonspecific but could potentially represent an ovarian ectopic pregnancy.   Electronically Signed   By: Andreas Newport M.D.   On: 12/20/2014 12:02   US Ob Transvaginal  12/26/2014   CLINICAL DATA:  Pregnant patient with pain.  Beta HCG 1243.  EXAM: TRANSVAGINAL OB ULTRASOUND  TECHNIQUE: Transvaginal ultrasound was performed for complete evaluation of the gestation as well as the maternal uterus, adnexal regions, and pelvic cul-de-sac.  COMPARISON:  Obstetric ultrasound  12/20/2014  FINDINGS: Intrauterine gestational sac: Development of small fluid collection in the fundal portion of the endometrium.  Yolk sac:  Not present.  Embryo:  Not present.  Cardiac Activity: Not present.  MSD: 2.5  mm   4 w   5  d  Maternal uterus/adnexae: The right ovary appears normal. The previously seen echogenic lesion in the left ovary is no longer identified. Blood flow seen to both ovarian parenchyma. Trace free fluid in the pelvis.  IMPRESSION: 1. Development of small, 2.5 mm fluid collection in the endometrium, like early intrauterine gestational sac. Yolk sac, fetal pole, or cardiac activity not yet visualized. Recommend follow-up quantitative B-HCG levels and follow-up US in 14 days to confirm and assess viability. This recommendation follows SRU consensus guidelines: Diagnostic Criteria for Nonviable Pregnancy Early in the First Trimester. Malva Limes Med 2013; 161:0960-45. 2. The previously questioned intra ovarian lesion is no longer seen on the current  exam. There are no findings of ectopic pregnancy.   Electronically Signed   By: Rubye Oaks M.D.   On: 12/26/2014 01:56    Medical Management Chlamydia treated with azithromycin 1000 mg PO x 1 dose in MAU.  Pt tolerated well.  Pt stable at time of discharge.   ASSESSMENT 1. Chlamydia infection affecting pregnancy in first trimester, antepartum   2. Abdominal pain affecting pregnancy   3. Pregnancy of unknown anatomic location     PLAN Discharge home Return to MAU in 48 hours for repeat quant hcg Partner Rx written for treatment of chlamydia.  S/O present with pt and denies medication allergies.    Medication List    TAKE these medications        acetaminophen 500 MG tablet  Commonly known as:  TYLENOL  Take 2 tablets (1,000 mg total) by mouth every 6 (six) hours as needed.           Follow-up Information    Please follow up.   Why:  Start prenatal care as soon as possible      Follow up with [facility] [place] [facility].   Why:  As needed for emergencies   Contact information:   [address] [id number] mc [place] [address] [phone]      Sharen Counter Certified Nurse-Midwife 12/26/2014  2:52 AM

## 2014-12-26 NOTE — MAU Note (Signed)
Off and on abd pain since Monday. Worse for last 4 hours. Feels like "needles" and cramps. Denies vag bleeding or d/c

## 2014-12-26 NOTE — Discharge Instructions (Signed)
Chlamydia Chlamydia is an infection. It is spread through sexual contact. Chlamydia can be in different areas of the body. These areas include the cervix, urethra, throat, or rectum. You may not know you have chlamydia because many people never develop the symptoms. Chlamydia is not difficult to treat once you know you have it. However, if it is left untreated, chlamydia can lead to more serious health problems.  CAUSES  Chlamydia is caused by bacteria. It is a sexually transmitted disease. It is passed from an infected partner during intimate contact. This contact could be with the genitals, mouth, or rectal area. Chlamydia can also be passed from mothers to babies during birth. SIGNS AND SYMPTOMS  There may not be any symptoms. This is often the case early in the infection. If symptoms develop, they may include:  Mild pain and discomfort when urinating.  Redness, soreness, and swelling (inflammation) of the rectum.  Vaginal discharge.  Painful intercourse.  Abdominal pain.  Bleeding between menstrual periods. DIAGNOSIS  To diagnose this infection, your health care provider will do a pelvic exam. Cultures will be taken of the vagina, cervix, urine, and possibly the rectum to verify the diagnosis.  TREATMENT You will be given antibiotic medicines. If you are pregnant, certain types of antibiotics will need to be avoided. Any sexual partners should also be treated, even if they do not show symptoms.  HOME CARE INSTRUCTIONS   Take your antibiotic medicine as directed by your health care provider. Finish the antibiotic even if you start to feel better.  Take medicines only as directed by your health care provider.  Inform any sexual partners about the infection. They should also be treated.  Do not have sexual contact until your health care provider tells you it is okay.  Get plenty of rest.  Eat a well-balanced diet.  Drink enough fluids to keep your urine clear or pale  yellow.  Keep all follow-up visits as directed by your health care provider. SEEK MEDICAL CARE IF:  You have painful urination.  You have abdominal pain.  You have vaginal discharge.  You have painful sexual intercourse.  You have bleeding between periods and after sex.  You have a fever. SEEK IMMEDIATE MEDICAL CARE IF:   You experience nausea or vomiting.  You experience excessive sweating (diaphoresis).  You have difficulty swallowing. MAKE SURE YOU:   Understand these instructions.  Will watch your condition.  Will get help right away if you are not doing well or get worse. Document Released: 02/21/2005 Document Revised: 09/28/2013 Document Reviewed: 01/19/2013 [facility] Patient Information 2015 [place], [place]. This information is not intended to replace advice given to you by your health care provider. Make sure you discuss any questions you have with your health care provider.  Prenatal Care [facility]    [facility]  & [facility]  [phone]     Phone: [phone]          [facility]                      [facility]   [place]     Phone: [phone]  Phone: [phone]         [facility]     Phone: [phone]  Phone: [phone]           [facility] [place]  hone: [phone]  Phone: [phone]         [facility] Dr. Francoise Ceo      Phone: [phone]  Phone: [phone]         [facility]                Phone: [phone]  [facility]   [address] [place])          Phone: [phone] [facility]   Phone: [phone]

## 2014-12-28 ENCOUNTER — Inpatient Hospital Stay (HOSPITAL_COMMUNITY)
Admission: AD | Admit: 2014-12-28 | Discharge: 2014-12-28 | Disposition: A | Discharge status: Home / Self Care | Payer: Medicaid Other | Source: Ambulatory Visit | Attending: Obstetrics & Gynecology | Admitting: Obstetrics & Gynecology

## 2014-12-28 DIAGNOSIS — R109 Unspecified abdominal pain: Secondary | ICD-10-CM

## 2014-12-28 DIAGNOSIS — O9989 Other specified diseases and conditions complicating pregnancy, childbirth and the puerperium: Secondary | ICD-10-CM | POA: Insufficient documentation

## 2014-12-28 DIAGNOSIS — O26899 Other specified pregnancy related conditions, unspecified trimester: Secondary | ICD-10-CM

## 2014-12-28 LAB — HCG, QUANTITATIVE, PREGNANCY: hCG, Beta Chain, Quant, S: 4461 m[IU]/mL — ABNORMAL HIGH (ref <5)

## 2014-12-28 NOTE — MAU Note (Signed)
Pt presents for follow up BHCG, denies pain or bleeding  

## 2014-12-28 NOTE — Discharge Instructions (Signed)
First Trimester of Pregnancy The first trimester of pregnancy is from week 1 until the end of week 12 (months 1 through 3). During this time, your baby will begin to develop inside you. At 6-8 weeks, the eyes and face are formed, and the heartbeat can be seen on ultrasound. At the end of 12 weeks, all the baby's organs are formed. Prenatal care is all the medical care you receive before the birth of your baby. Make sure you get good prenatal care and follow all of your doctor's instructions. HOME CARE  Medicines  Take medicine only as told by your doctor. Some medicines are safe and some are not during pregnancy.  Take your prenatal vitamins as told by your doctor.  Take medicine that helps you poop (stool softener) as needed if your doctor says it is okay. Diet  Eat regular, healthy meals.  Your doctor will tell you the amount of weight gain that is right for you.  Avoid raw meat and uncooked cheese.  If you feel sick to your stomach (nauseous) or throw up (vomit):  Eat 4 or 5 small meals a day instead of 3 large meals.  Try eating a few soda crackers.  Drink liquids between meals instead of during meals.  If you have a hard time pooping (constipation):  Eat high-fiber foods like fresh vegetables, fruit, and whole grains.  Drink enough fluids to keep your pee (urine) clear or pale yellow. Activity and Exercise  Exercise only as told by your doctor. Stop exercising if you have cramps or pain in your lower belly (abdomen) or low back.  Try to avoid standing for long periods of time. Move your legs often if you must stand in one place for a long time.  Avoid heavy lifting.  Wear low-heeled shoes. Sit and stand up straight.  You can have sex unless your doctor tells you not to. Relief of Pain or Discomfort  Wear a good support bra if your breasts are sore.  Take warm water baths (sitz baths) to soothe pain or discomfort caused by hemorrhoids. Use hemorrhoid cream if your  doctor says it is okay.  Rest with your legs raised if you have leg cramps or low back pain.  Wear support hose if you have puffy, bulging veins (varicose veins) in your legs. Raise (elevate) your feet for 15 minutes, 3-4 times a day. Limit salt in your diet. Prenatal Care  Schedule your prenatal visits by the twelfth week of pregnancy.  Write down your questions. Take them to your prenatal visits.  Keep all your prenatal visits as told by your doctor. Safety  Wear your seat belt at all times when driving.  Make a list of emergency phone numbers. The list should include numbers for family, friends, the hospital, and police and fire departments. General Tips  Ask your doctor for a referral to a local prenatal class. Begin classes no later than at the start of month 6 of your pregnancy.  Ask for help if you need counseling or help with nutrition. Your doctor can give you advice or tell you where to go for help.  Do not use hot tubs, steam rooms, or saunas.  Do not douche or use tampons or scented sanitary pads.  Do not cross your legs for long periods of time.  Avoid litter boxes and soil used by cats.  Avoid all smoking, herbs, and alcohol. Avoid drugs not approved by your doctor.  Visit your dentist. At home, brush your teeth   with a soft toothbrush. Be gentle when you floss. GET HELP IF:  You are dizzy.  You have mild cramps or pressure in your lower belly.  You have a nagging pain in your belly area.  You continue to feel sick to your stomach, throw up, or have watery poop (diarrhea).  You have a bad smelling fluid coming from your vagina.  You have pain with peeing (urination).  You have increased puffiness (swelling) in your face, hands, legs, or ankles. GET HELP RIGHT AWAY IF:   You have a fever.  You are leaking fluid from your vagina.  You have spotting or bleeding from your vagina.  You have very bad belly cramping or pain.  You gain or lose weight  rapidly.  You throw up blood. It may look like coffee grounds.  You are around people who have German measles, fifth disease, or chickenpox.  You have a very bad headache.  You have shortness of breath.  You have any kind of trauma, such as from a fall or a car accident. Document Released: 10/31/2007 Document Revised: 09/28/2013 Document Reviewed: 03/24/2013 [employer] Patient Information 2015 [employer]. This information is not intended to replace advice given to you by your health care provider. Make sure you discuss any questions you have with your health care provider.  

## 2014-12-28 NOTE — MAU Provider Note (Signed)
[name]  is a 24 y.o. G2P1001 at [redacted]w[redacted]d who presents to MAU today for follow-up quant hCG after 48 hours. The patient denies abdominal pain, vaginal bleeding, N/V or fever today. [place] on 12/26/14 shows possible early IUGS without YS or FP and normal adnexa.    BP 132/75 mmHg  Pulse 81  Temp(Src) 98.8 F (37.1 C) (Oral)  Resp 18  Ht  (1.549 m)  Wt 152 lb (68.947 kg)  BMI 28.74 kg/m2  SpO2 100%  LMP 11/09/2014  CONSTITUTIONAL: Well-developed, well-nourished female in no acute distress.  ENT: External right and left ear normal.  EYES: EOM intact, conjunctivae normal.  MUSCULOSKELETAL: Normal range of motion.  CARDIOVASCULAR: Regular heart rate RESPIRATORY: Normal effort NEUROLOGICAL: Alert and oriented to person, place, and time.  SKIN: Skin is warm and dry. No rash noted. Not diaphoretic. No erythema. No pallor. PSYCH: Normal mood and affect. Normal behavior. Normal judgment and thought content.  Results for KAYTLYNNE, NEACE (MRN [id number]) as of 12/28/2014 20:42  Ref. Range 12/26/2014 00:40 12/26/2014 01:48 12/28/2014 19:22  HCG, Beta Chain, Quant, S Latest Ref Range: <5 mIU/mL 1243 (H)  4461 (H)   A: Appropriate rise in quant hCG after 48 hours  P: Discharge home First trimester/ectopic precautions discussed Patient will return for follow-up US in 1 week. Order placed. They will call the patient with an appointment time Patient may return to MAU as needed or if her condition were to change or worsen   Marny Lowenstein, PA-C 12/28/2014 8:24 PM

## 2015-01-06 ENCOUNTER — Encounter (HOSPITAL_COMMUNITY): Payer: Self-pay | Admitting: Advanced Practice Midwife

## 2015-01-06 ENCOUNTER — Ambulatory Visit (HOSPITAL_COMMUNITY)
Admission: RE | Admit: 2015-01-06 | Discharge: 2015-01-06 | Disposition: A | Discharge status: Home / Self Care | Payer: Medicaid Other | Source: Ambulatory Visit | Attending: Medical | Admitting: Medical

## 2015-01-06 ENCOUNTER — Inpatient Hospital Stay (HOSPITAL_COMMUNITY)
Admission: AD | Admit: 2015-01-06 | Discharge: 2015-01-06 | Disposition: A | Discharge status: Home / Self Care | Payer: Medicaid Other | Source: Ambulatory Visit | Attending: Obstetrics & Gynecology | Admitting: Obstetrics & Gynecology

## 2015-01-06 ENCOUNTER — Telehealth: Payer: Self-pay | Admitting: Advanced Practice Midwife

## 2015-01-06 DIAGNOSIS — O9989 Other specified diseases and conditions complicating pregnancy, childbirth and the puerperium: Secondary | ICD-10-CM | POA: Diagnosis not present

## 2015-01-06 DIAGNOSIS — R109 Unspecified abdominal pain: Secondary | ICD-10-CM | POA: Diagnosis not present

## 2015-01-06 DIAGNOSIS — O26899 Other specified pregnancy related conditions, unspecified trimester: Secondary | ICD-10-CM

## 2015-01-06 NOTE — Telephone Encounter (Signed)
See telephone note Aviva Signs, CNM

## 2015-01-06 NOTE — Discharge Instructions (Signed)
First Trimester of Pregnancy °The first trimester of pregnancy is from week 1 until the end of week 12 (months 1 through 3). A week after a sperm fertilizes an egg, the egg will implant on the wall of the uterus. This embryo will begin to develop into a baby. Genes from you and your partner are forming the baby. The female genes determine whether the baby is a boy or a girl. At 6-8 weeks, the eyes and face are formed, and the heartbeat can be seen on ultrasound. At the end of 12 weeks, all the baby's organs are formed.  °Now that you are pregnant, you will want to do everything you can to have a healthy baby. Two of the most important things are to get good prenatal care and to follow your health care provider's instructions. Prenatal care is all the medical care you receive before the baby's birth. This care will help prevent, find, and treat any problems during the pregnancy and childbirth. °BODY CHANGES °Your body goes through many changes during pregnancy. The changes vary from woman to woman.  °· You may gain or lose a couple of pounds at first. °· You may feel sick to your stomach (nauseous) and throw up (vomit). If the vomiting is uncontrollable, call your health care provider. °· You may tire easily. °· You may develop headaches that can be relieved by medicines approved by your health care provider. °· You may urinate more often. Painful urination may mean you have a bladder infection. °· You may develop heartburn as a result of your pregnancy. °· You may develop constipation because certain hormones are causing the muscles that push waste through your intestines to slow down. °· You may develop hemorrhoids or swollen, bulging veins (varicose veins). °· Your breasts may begin to grow larger and become tender. Your nipples may stick out more, and the tissue that surrounds them (areola) may become darker. °· Your gums may bleed and may be sensitive to brushing and flossing. °· Dark spots or blotches (chloasma,  mask of pregnancy) may develop on your face. This will likely fade after the baby is born. °· Your menstrual periods will stop. °· You may have a loss of appetite. °· You may develop cravings for certain kinds of food. °· You may have changes in your emotions from day to day, such as being excited to be pregnant or being concerned that something may go wrong with the pregnancy and baby. °· You may have more vivid and strange dreams. °· You may have changes in your hair. These can include thickening of your hair, rapid growth, and changes in texture. Some women also have hair loss during or after pregnancy, or hair that feels dry or thin. Your hair will most likely return to normal after your baby is born. °WHAT TO EXPECT AT YOUR PRENATAL VISITS °During a routine prenatal visit: °· You will be weighed to make sure you and the baby are growing normally. °· Your blood pressure will be taken. °· Your abdomen will be measured to track your baby's growth. °· The fetal heartbeat will be listened to starting around week 10 or 12 of your pregnancy. °· Test results from any previous visits will be discussed. °Your health care provider may ask you: °· How you are feeling. °· If you are feeling the baby move. °· If you have had any abnormal symptoms, such as leaking fluid, bleeding, severe headaches, or abdominal cramping. °· If you have any questions. °Other tests   that may be performed during your first trimester include: °· Blood tests to find your blood type and to check for the presence of any previous infections. They will also be used to check for low iron levels (anemia) and Rh antibodies. Later in the pregnancy, blood tests for diabetes will be done along with other tests if problems develop. °· Urine tests to check for infections, diabetes, or protein in the urine. °· An ultrasound to confirm the proper growth and development of the baby. °· An amniocentesis to check for possible genetic problems. °· Fetal screens for  spina bifida and Down syndrome. °· You may need other tests to make sure you and the baby are doing well. °HOME CARE INSTRUCTIONS  °Medicines °· Follow your health care provider's instructions regarding medicine use. Specific medicines may be either safe or unsafe to take during pregnancy. °· Take your prenatal vitamins as directed. °· If you develop constipation, try taking a stool softener if your health care provider approves. °Diet °· Eat regular, well-balanced meals. Choose a variety of foods, such as meat or vegetable-based protein, fish, milk and low-fat dairy products, vegetables, fruits, and whole grain breads and cereals. Your health care provider will help you determine the amount of weight gain that is right for you. °· Avoid raw meat and uncooked cheese. These carry germs that can cause birth defects in the baby. °· Eating four or five small meals rather than three large meals a day may help relieve nausea and vomiting. If you start to feel nauseous, eating a few soda crackers can be helpful. Drinking liquids between meals instead of during meals also seems to help nausea and vomiting. °· If you develop constipation, eat more high-fiber foods, such as fresh vegetables or fruit and whole grains. Drink enough fluids to keep your urine clear or pale yellow. °Activity and Exercise °· Exercise only as directed by your health care provider. Exercising will help you: °¨ Control your weight. °¨ Stay in shape. °¨ Be prepared for labor and delivery. °· Experiencing pain or cramping in the lower abdomen or low back is a good sign that you should stop exercising. Check with your health care provider before continuing normal exercises. °· Try to avoid standing for long periods of time. Move your legs often if you must stand in one place for a long time. °· Avoid heavy lifting. °· Wear low-heeled shoes, and practice good posture. °· You may continue to have sex unless your health care provider directs you  otherwise. °Relief of Pain or Discomfort °· Wear a good support bra for breast tenderness.   °· Take warm sitz baths to soothe any pain or discomfort caused by hemorrhoids. Use hemorrhoid cream if your health care provider approves.   °· Rest with your legs elevated if you have leg cramps or low back pain. °· If you develop varicose veins in your legs, wear support hose. Elevate your feet for 15 minutes, 3-4 times a day. Limit salt in your diet. °Prenatal Care °· Schedule your prenatal visits by the twelfth week of pregnancy. They are usually scheduled monthly at first, then more often in the last 2 months before delivery. °· Write down your questions. Take them to your prenatal visits. °· Keep all your prenatal visits as directed by your health care provider. °Safety °· Wear your seat belt at all times when driving. °· Make a list of emergency phone numbers, including numbers for family, friends, the hospital, and police and fire departments. °General Tips °·   Ask your health care provider for a referral to a local prenatal education class. Begin classes no later than at the beginning of month 6 of your pregnancy. °· Ask for help if you have counseling or nutritional needs during pregnancy. Your health care provider can offer advice or refer you to specialists for help with various needs. °· Do not use hot tubs, steam rooms, or saunas. °· Do not douche or use tampons or scented sanitary pads. °· Do not cross your legs for long periods of time. °· Avoid cat litter boxes and soil used by cats. These carry germs that can cause birth defects in the baby and possibly loss of the fetus by miscarriage or stillbirth. °· Avoid all smoking, herbs, alcohol, and medicines not prescribed by your health care provider. Chemicals in these affect the formation and growth of the baby. °· Schedule a dentist appointment. At home, brush your teeth with a soft toothbrush and be gentle when you floss. °SEEK MEDICAL CARE IF:  °· You have  dizziness. °· You have mild pelvic cramps, pelvic pressure, or nagging pain in the abdominal area. °· You have persistent nausea, vomiting, or diarrhea. °· You have a bad smelling vaginal discharge. °· You have pain with urination. °· You notice increased swelling in your face, hands, legs, or ankles. °SEEK IMMEDIATE MEDICAL CARE IF:  °· You have a fever. °· You are leaking fluid from your vagina. °· You have spotting or bleeding from your vagina. °· You have severe abdominal cramping or pain. °· You have rapid weight gain or loss. °· You vomit blood or material that looks like coffee grounds. °· You are exposed to German measles and have never had them. °· You are exposed to fifth disease or chickenpox. °· You develop a severe headache. °· You have shortness of breath. °· You have any kind of trauma, such as from a fall or a car accident. °Document Released: 05/08/2001 Document Revised: 09/28/2013 Document Reviewed: 03/24/2013 °ExitCare® Patient Information ©2015 [employer]. This information is not intended to replace advice given to you by your health care provider. Make sure you discuss any questions you have with your health care provider. °Prenatal Care Providers °[facility]    [facility] ° Phone- [phone]     Phone: [phone] °         °[facility]                      [facility] ° @[place]     Phone: [phone] ° Phone: [phone] °        [facility] °[facility]     Phone: [phone] ° Phone: [phone]   °        [facility] °[facility] [place]                hone: [phone] ° Phone: [phone] °        [facility] °Dr. Bernard Marshall      Phone: [phone] ° Phone: [phone] °        [facility] °[facility]                Phone: [phone] ° Women’s [facility]  ° Phone:[phone]    [facility] ([place]) °         Phone:  [phone] °[facility] °  Phone:   [phone] ° °

## 2015-01-08 ENCOUNTER — Inpatient Hospital Stay (HOSPITAL_COMMUNITY)
Admission: AD | Admit: 2015-01-08 | Discharge: 2015-01-08 | Disposition: A | Discharge status: Home / Self Care | Payer: Medicaid Other | Source: Ambulatory Visit | Attending: Obstetrics & Gynecology | Admitting: Obstetrics & Gynecology

## 2015-01-08 ENCOUNTER — Encounter (HOSPITAL_COMMUNITY): Payer: Self-pay | Admitting: *Deleted

## 2015-01-08 DIAGNOSIS — O209 Hemorrhage in early pregnancy, unspecified: Secondary | ICD-10-CM | POA: Diagnosis present

## 2015-01-08 DIAGNOSIS — O2691 Pregnancy related conditions, unspecified, first trimester: Secondary | ICD-10-CM

## 2015-01-08 DIAGNOSIS — O208 Other hemorrhage in early pregnancy: Secondary | ICD-10-CM | POA: Diagnosis not present

## 2015-01-08 DIAGNOSIS — Z3A01 Less than 8 weeks gestation of pregnancy: Secondary | ICD-10-CM | POA: Insufficient documentation

## 2015-01-08 DIAGNOSIS — N930 Postcoital and contact bleeding: Secondary | ICD-10-CM

## 2015-01-08 LAB — WET PREP, GENITAL
Clue Cells Wet Prep HPF POC: NONE SEEN
Trich, Wet Prep: NONE SEEN
WBC, Wet Prep HPF POC: NONE SEEN
Yeast Wet Prep HPF POC: NONE SEEN

## 2015-01-08 LAB — URINE MICROSCOPIC-ADD ON

## 2015-01-08 LAB — URINALYSIS, ROUTINE W REFLEX MICROSCOPIC
BILIRUBIN URINE: NEGATIVE
Glucose, UA: NEGATIVE mg/dL
KETONES UR: NEGATIVE mg/dL
LEUKOCYTES UA: NEGATIVE
Nitrite: NEGATIVE
Protein, ur: NEGATIVE mg/dL
SPECIFIC GRAVITY, URINE: 1.02 (ref 1.005–1.030)
Urobilinogen, UA: 0.2 mg/dL (ref 0.0–1.0)
pH: 7 (ref 5.0–8.0)

## 2015-01-08 NOTE — Discharge Instructions (Signed)
Prenatal Care  WHAT IS PRENATAL CARE?  Prenatal care means health care during your pregnancy, before your baby is born. It is very important to take care of yourself and your baby during your pregnancy by:   Getting early prenatal care. If you know you are pregnant, or think you might be pregnant, call your health care provider as soon as possible. Schedule a visit for a prenatal exam.  Getting regular prenatal care. Follow your health care provider's schedule for blood and other necessary tests. Do not miss appointments.  Doing everything you can to keep yourself and your baby healthy during your pregnancy.  Getting complete care. Prenatal care should include evaluation of the medical, dietary, educational, psychological, and social needs of you and your significant other. The medical and genetic history of your family and the family of your baby's father should be discussed with your health care provider.  Discussing with your health care provider:  Prescription, over-the-counter, and herbal medicines that you take.  Any history of substance abuse, alcohol use, smoking, and illegal drug use.  Any history of domestic abuse and violence.  Immunizations you have received.  Your nutrition and diet.  The amount of exercise you do.  Any environmental and occupational hazards to which you are exposed.  History of sexually transmitted infections for both you and your partner.  Previous pregnancies you have had. WHY IS PRENATAL CARE SO IMPORTANT?  By regularly seeing your health care provider, you help ensure that problems can be identified early so that they can be treated as soon as possible. Other problems might be prevented. Many studies have shown that early and regular prenatal care is important for the health of mothers and their babies.  HOW CAN I TAKE CARE OF MYSELF WHILE I AM PREGNANT?  Here are ways to take care of yourself and your baby:   Start or continue taking your  multivitamin with 400 micrograms (mcg) of folic acid every day.  Get early and regular prenatal care. It is very important to see a health care provider during your pregnancy. Your health care provider will check at each visit to make sure that you and your baby are healthy. If there are any problems, action can be taken right away to help you and your baby.  Eat a healthy diet that includes:  Fruits.  Vegetables.  Foods low in saturated fat.  Whole grains.  Calcium-rich foods, such as milk, yogurt, and hard cheeses.  Drink 6-8 glasses of liquids a day.  Unless your health care provider tells you not to, try to be physically active for 30 minutes, most days of the week. If you are pressed for time, you can get your activity in through 10-minute segments, three times a day.  Do not smoke, drink alcohol, or use drugs. These can cause long-term damage to your baby. Talk with your health care provider about steps to take to stop smoking. Talk with a member of your faith community, a counselor, a trusted friend, or your health care provider if you are concerned about your alcohol or drug use.  Ask your health care provider before taking any medicine, even over-the-counter medicines. Some medicines are not safe to take during pregnancy.  Get plenty of rest and sleep.  Avoid hot tubs and saunas during pregnancy.  Do not have X-rays taken unless absolutely necessary and with the recommendation of your health care provider. A lead shield can be placed on your abdomen to protect your baby when   X-rays are taken in other parts of your body.  Do not empty the cat litter when you are pregnant. It may contain a parasite that causes an infection called toxoplasmosis, which can cause birth defects. Also, use gloves when working in garden areas used by cats.  Do not eat uncooked or undercooked meats or fish.  Do not eat soft, mold-ripened cheeses (Brie, Camembert, and chevre) or soft, blue-veined  cheese (Danish blue and Roquefort).  Stay away from toxic chemicals like:  Insecticides.  Solvents (some cleaners or paint thinners).  Lead.  Mercury.  Sexual intercourse may continue until the end of the pregnancy, unless you have a medical problem or there is a problem with the pregnancy and your health care provider tells you not to.  Do not wear high-heel shoes, especially during the second half of the pregnancy. You can lose your balance and fall.  Do not take long trips, unless absolutely necessary. Be sure to see your health care provider before going on the trip.  Do not sit in one position for more than 2 hours when on a trip.  Take a copy of your medical records when going on a trip. Know where a hospital is located in the city you are visiting, in case of an emergency.  Most dangerous household products will have pregnancy warnings on their labels. Ask your health care provider about products if you are unsure.  Limit or eliminate your caffeine intake from coffee, tea, sodas, medicines, and chocolate.  Many women continue working through pregnancy. Staying active might help you stay healthier. If you have a question about the safety or the hours you work at your particular job, talk with your health care provider.  Get informed:  Read books.  Watch videos.  Go to childbirth classes for you and your significant other.  Talk with experienced moms.  Ask your health care provider about childbirth education classes for you and your partner. Classes can help you and your partner prepare for the birth of your baby.  Ask about a baby doctor (pediatrician) and methods and pain medicine for labor, delivery, and possible cesarean delivery. HOW OFTEN SHOULD I SEE MY HEALTH CARE PROVIDER DURING PREGNANCY?  Your health care provider will give you a schedule for your prenatal visits. You will have visits more often as you get closer to the end of your pregnancy. An average  pregnancy lasts about 40 weeks.  A typical schedule includes visiting your health care provider:   About once each month during your first 6 months of pregnancy.  Every 2 weeks during the next 2 months.  Weekly in the last month, until the delivery date. Your health care provider will probably want to see you more often if:  You are older than 35 years.  Your pregnancy is high risk because you have certain health problems or problems with the pregnancy, such as:  Diabetes.  High blood pressure.  The baby is not growing on schedule, according to the dates of the pregnancy. Your health care provider will do special tests to make sure you and your baby are not having any serious problems. WHAT HAPPENS DURING PRENATAL VISITS?   At your first prenatal visit, your health care provider will do a physical exam and talk to you about your health history and the health history of your partner and your family. Your health care provider will be able to tell you what date to expect your baby to be born on.  Your   first physical exam will include checks of your blood pressure, measurements of your height and weight, and an exam of your pelvic organs. Your health care provider will do a Pap test if you have not had one recently and will do cultures of your cervix to make sure there is no infection.  At each prenatal visit, there will be tests of your blood, urine, blood pressure, weight, and the progress of the baby will be checked.  At your later prenatal visits, your health care provider will check how you are doing and how your baby is developing. You may have a number of tests done as your pregnancy progresses.  Ultrasound exams are often used to check on your baby's growth and health.  You may have more urine and blood tests, as well as special tests, if needed. These may include amniocentesis to examine fluid in the pregnancy sac, stress tests to check how the baby responds to contractions, or a  biophysical profile to measure your baby's well-being. Your health care provider will explain the tests and why they are necessary.  You should be tested for high blood sugar (gestational diabetes) between the 24th and 28th weeks of your pregnancy.  You should discuss with your health care provider your plans to breastfeed or bottle-feed your baby.  Each visit is also a chance for you to learn about staying healthy during pregnancy and to ask questions. Document Released: 05/17/2003 Document Revised: 05/19/2013 Document Reviewed: 07/29/2013 ExitCare Patient Information 2015 [employer]. This information is not intended to replace advice given to you by your health care provider. Make sure you discuss any questions you have with your health care provider.  

## 2015-01-08 NOTE — MAU Note (Signed)
Pt states here for bleeding that began early this am. No recent intercourse. Denies pain. Blood noted only when wiping.

## 2015-01-08 NOTE — MAU Provider Note (Signed)
History     CSN: [id number]  Arrival date and time: 01/08/15 1135   First Provider Initiated Contact with Patient 01/08/15 1237      Chief Complaint  Patient presents with  . Vaginal Bleeding   HPI Kayla [name] 24 y.o. G2P1001 @[redacted]w[redacted]d  presents to MAU for vaginal bleeding.  She had U/S confirming iUP earlier this week.  The bleeding was noted this morning upon wiping.  She noted it on the toilet tissue and noted it to be a light red.  It was not seen on toilet water or in underwear at all since.  She denies abdominal pain, weakness, dizziness, headache, nausea, vomiting.   She has appt with [facility] on 8/19.  Last IC 2-3 days ago. OB History    Gravida Para Term Preterm AB TAB SAB Ectopic Multiple Living   2 1 1       1       Past Medical History  Diagnosis Date  . Medical history non-contributory     Past Surgical History  Procedure Laterality Date  . No past surgeries      Family History  Problem Relation Age of Onset  . Heart disease Father 62    [facility]    Social History  Substance Use Topics  . Smoking status: Never Smoker   . Smokeless tobacco: Never Used  . Alcohol Use: Yes     Comment: occ    Allergies: No Known Allergies  Prescriptions prior to admission  Medication Sig Dispense Refill Last Dose  . Prenatal Vit-Fe Fumarate-FA (PRENATAL MULTIVITAMIN) TABS tablet Take 1 tablet by mouth daily at 12 noon.   01/07/2015 at Unknown time  . acetaminophen (TYLENOL) 500 MG tablet Take 2 tablets (1,000 mg total) by mouth every 6 (six) hours as needed. (Patient not taking: Reported on 01/08/2015) 30 tablet 0     ROS Pertinent ROS in HPI.  All other systems are negative.   Physical Exam   Blood pressure 121/68, pulse 86, temperature 98.8 F (37.1 C), temperature source Oral, resp. rate 18, last menstrual period 11/09/2014.  Physical Exam  Constitutional: She is oriented to person, place, and time. She appears well-developed and well-nourished. No  distress.  HENT:  Head: Normocephalic and atraumatic.  Eyes: EOM are normal.  Cardiovascular: Normal rate.   Respiratory: Effort normal and breath sounds normal. No respiratory distress.  GI: Bowel sounds are normal. She exhibits no distension.  Genitourinary:  No visible blood.  White adherent discharge noted.   No CMT.  No adnexal mass or tenderness  Musculoskeletal: Normal range of motion.  Neurological: She is alert and oriented to person, place, and time.  Skin: Skin is warm and dry.  Psychiatric: She has a normal mood and affect.    MAU Course  Procedures  MDM Results for orders placed or performed during the hospital encounter of 01/08/15 (from the past 24 hour(s))  Urinalysis, Routine w reflex microscopic (not at [facility])     Status: Abnormal   Collection Time: 01/08/15 12:29 PM  Result Value Ref Range   Color, Urine YELLOW YELLOW   APPearance CLEAR CLEAR   Specific Gravity, Urine 1.020 1.005 - 1.030   pH 7.0 5.0 - 8.0   Glucose, UA NEGATIVE NEGATIVE mg/dL   Hgb urine dipstick SMALL (A) NEGATIVE   Bilirubin Urine NEGATIVE NEGATIVE   Ketones, ur NEGATIVE NEGATIVE mg/dL   Protein, ur NEGATIVE NEGATIVE mg/dL   Urobilinogen, UA 0.2 0.0 - 1.0 mg/dL   Nitrite  NEGATIVE NEGATIVE   Leukocytes, UA NEGATIVE NEGATIVE  Urine microscopic-add on     Status: Abnormal   Collection Time: 01/08/15 12:29 PM  Result Value Ref Range   Squamous Epithelial / LPF MANY (A) RARE   WBC, UA 3-6 <3 WBC/hpf   RBC / HPF 0-2 <3 RBC/hpf   Urine-Other MUCOUS PRESENT   Wet prep, genital     Status: None   Collection Time: 01/08/15 12:46 PM  Result Value Ref Range   Yeast Wet Prep HPF POC NONE SEEN NONE SEEN   Trich, Wet Prep NONE SEEN NONE SEEN   Clue Cells Wet Prep HPF POC NONE SEEN NONE SEEN   WBC, Wet Prep HPF POC NONE SEEN NONE SEEN   NO visible blood.  NO evidence of pain or infection  Assessment and Plan  A: Post-coital bleeding in pregnancy  P: Discharge to home Keep appt for [facility]  later this week PNV qd Patient may return to MAU as needed or if her condition were to change or worsen    Bertram Denver 01/08/2015, 12:38 PM

## 2015-01-14 ENCOUNTER — Other Ambulatory Visit: Payer: Self-pay | Admitting: Obstetrics and Gynecology

## 2015-01-14 LAB — OB RESULTS CONSOLE GC/CHLAMYDIA
Chlamydia: NEGATIVE
Gonorrhea: NEGATIVE

## 2015-01-18 LAB — CYTOLOGY - PAP

## 2015-02-06 ENCOUNTER — Inpatient Hospital Stay (HOSPITAL_COMMUNITY)
Admission: AD | Admit: 2015-02-06 | Discharge: 2015-02-06 | Disposition: A | Discharge status: Home / Self Care | Payer: Medicaid Other | Source: Ambulatory Visit | Attending: Obstetrics and Gynecology | Admitting: Obstetrics and Gynecology

## 2015-02-06 ENCOUNTER — Encounter (HOSPITAL_COMMUNITY): Payer: Self-pay | Admitting: *Deleted

## 2015-02-06 DIAGNOSIS — O2690 Pregnancy related conditions, unspecified, unspecified trimester: Secondary | ICD-10-CM | POA: Diagnosis not present

## 2015-02-06 DIAGNOSIS — R102 Pelvic and perineal pain: Secondary | ICD-10-CM | POA: Insufficient documentation

## 2015-02-06 DIAGNOSIS — M545 Low back pain, unspecified: Secondary | ICD-10-CM

## 2015-02-06 DIAGNOSIS — Z3A10 10 weeks gestation of pregnancy: Secondary | ICD-10-CM | POA: Insufficient documentation

## 2015-02-06 DIAGNOSIS — O26891 Other specified pregnancy related conditions, first trimester: Secondary | ICD-10-CM | POA: Diagnosis not present

## 2015-02-06 DIAGNOSIS — R109 Unspecified abdominal pain: Secondary | ICD-10-CM | POA: Diagnosis present

## 2015-02-06 DIAGNOSIS — O26899 Other specified pregnancy related conditions, unspecified trimester: Secondary | ICD-10-CM

## 2015-02-06 LAB — URINALYSIS, ROUTINE W REFLEX MICROSCOPIC
BILIRUBIN URINE: NEGATIVE
GLUCOSE, UA: NEGATIVE mg/dL
Hgb urine dipstick: NEGATIVE
KETONES UR: 15 mg/dL — AB
Nitrite: NEGATIVE
PH: 6 (ref 5.0–8.0)
Protein, ur: NEGATIVE mg/dL
SPECIFIC GRAVITY, URINE: 1.025 (ref 1.005–1.030)
Urobilinogen, UA: 0.2 mg/dL (ref 0.0–1.0)

## 2015-02-06 LAB — URINE MICROSCOPIC-ADD ON

## 2015-02-06 LAB — WET PREP, GENITAL
CLUE CELLS WET PREP: NONE SEEN
Trich, Wet Prep: NONE SEEN
Yeast Wet Prep HPF POC: NONE SEEN

## 2015-02-06 NOTE — MAU Provider Note (Signed)
MAU HISTORY AND PHYSICAL  Chief Complaint:  Abdominal Pain and Back Pain   Kayla [name] is a 24 y.o.  G2P1001 with IUP at [redacted]w[redacted]d presenting for Abdominal Pain and Back Pain  4 days of pain. Bilateral low back pain, no recent trauma. Doesn't radiate, no weakness or numbness, no loss of bowel or bladder or urinary retention.  Also having right-sided pelvic pain, on and off, for 3 weeks. No fever, no dysuria or hematuria, no vaginal pain or bleeding, no discharge, no pruritus. Pain is mild. No vomiting or nausea.   Past Medical History  Diagnosis Date  . Medical history non-contributory     Past Surgical History  Procedure Laterality Date  . No past surgeries      Family History  Problem Relation Age of Onset  . Heart disease Father 73    [facility]    Social History  Substance Use Topics  . Smoking status: Never Smoker   . Smokeless tobacco: Never Used  . Alcohol Use: Yes     Comment: occ    No Known Allergies  Prescriptions prior to admission  Medication Sig Dispense Refill Last Dose  . acetaminophen (TYLENOL) 500 MG tablet Take 2 tablets (1,000 mg total) by mouth every 6 (six) hours as needed. (Patient taking differently: Take 1,000 mg by mouth every 6 (six) hours as needed for mild pain or headache. ) 30 tablet 0 Past Week at Unknown time  . Prenatal Vit-Fe Fumarate-FA (PRENATAL MULTIVITAMIN) TABS tablet Take 1 tablet by mouth daily.    02/05/2015 at Unknown time    Review of Systems - Negative except for what is mentioned in HPI.  Physical Exam  Blood pressure 126/73, pulse 83, temperature 98.2 F (36.8 C), temperature source Oral, resp. rate 16, height 5' (1.524 m), weight 153 lb (69.4 kg), last menstrual period 11/09/2014. GENERAL: Well-developed, well-nourished female in no acute distress.  LUNGS: Clear to auscultation bilaterally.  HEART: Regular rate and rhythm. ABDOMEN: Soft, nontender, nondistended.  EXTREMITIES: Nontender, no edema, 2+ distal pulses. GU:  normal vagina and cervix, mild amount white discharge, no cmt, no adnexal tenderness/fullness. Neuro: 5/5 le strength, distal sensation intact, normal gait, 1+ patellar and achilles reflexes   Labs: Results for orders placed or performed during the hospital encounter of 02/06/15 (from the past 24 hour(s))  Urinalysis, Routine w reflex microscopic (not at [facility])   Collection Time: 02/06/15  5:05 PM  Result Value Ref Range   Color, Urine YELLOW YELLOW   APPearance CLEAR CLEAR   Specific Gravity, Urine 1.025 1.005 - 1.030   pH 6.0 5.0 - 8.0   Glucose, UA NEGATIVE NEGATIVE mg/dL   Hgb urine dipstick NEGATIVE NEGATIVE   Bilirubin Urine NEGATIVE NEGATIVE   Ketones, ur 15 (A) NEGATIVE mg/dL   Protein, ur NEGATIVE NEGATIVE mg/dL   Urobilinogen, UA 0.2 0.0 - 1.0 mg/dL   Nitrite NEGATIVE NEGATIVE   Leukocytes, UA SMALL (A) NEGATIVE  Urine microscopic-add on   Collection Time: 02/06/15  5:05 PM  Result Value Ref Range   Squamous Epithelial / LPF MANY (A) RARE   WBC, UA 11-20 <3 WBC/hpf   RBC / HPF 0-2 <3 RBC/hpf   Bacteria, UA FEW (A) RARE   Urine-Other MUCOUS PRESENT   Wet prep, genital   Collection Time: 02/06/15  5:53 PM  Result Value Ref Range   Yeast Wet Prep HPF POC NONE SEEN NONE SEEN   Trich, Wet Prep NONE SEEN NONE SEEN   Clue Cells Wet  Prep HPF POC NONE SEEN NONE SEEN   WBC, Wet Prep HPF POC FEW (A) NONE SEEN    Imaging Studies:  No results found.  Assessment: Kayla [name] is  24 y.o. G2P1001 at [redacted]w[redacted]d presents with Abdominal Pain and Back Pain  Back pain is mild without neurologic or other red flag symptoms.  Pelvic pain may represent ligamentous pain. GC/chlamydia is pending; wet prep negative, and urinalysis not suggestive of infection.   FHTs not able to obtain, likely 2/2 gestational age.  Plan: - back pain return precautions. Tylenol, heating pad, exercise. - f/u gc/chlamydia - bleeding and infection and PTL/PPROM return precautions - ob f/u as  scheduled  Silvano Bilis 9/11/20166:52 PM

## 2015-02-06 NOTE — MAU Note (Signed)
Past 4 days has been having lower rt sided abd pain and low back pain, staying about the same. Comes and goes denies GI problems, no GU complaints.

## 2015-02-07 LAB — GC/CHLAMYDIA PROBE AMP (~~LOC~~) NOT AT ARMC
Chlamydia: NEGATIVE
Neisseria Gonorrhea: NEGATIVE

## 2015-02-16 LAB — OB RESULTS CONSOLE ABO/RH: RH TYPE: POSITIVE

## 2015-02-16 LAB — OB RESULTS CONSOLE RUBELLA ANTIBODY, IGM: Rubella: IMMUNE

## 2015-02-16 LAB — OB RESULTS CONSOLE ANTIBODY SCREEN: ANTIBODY SCREEN: NEGATIVE

## 2015-02-16 LAB — OB RESULTS CONSOLE RPR: RPR: NONREACTIVE

## 2015-02-16 LAB — OB RESULTS CONSOLE HIV ANTIBODY (ROUTINE TESTING): HIV: NONREACTIVE

## 2015-02-16 LAB — OB RESULTS CONSOLE HEPATITIS B SURFACE ANTIGEN: Hepatitis B Surface Ag: NEGATIVE

## 2015-03-01 ENCOUNTER — Inpatient Hospital Stay (HOSPITAL_COMMUNITY)
Admission: AD | Admit: 2015-03-01 | Discharge: 2015-03-01 | Disposition: A | Discharge status: Home / Self Care | Payer: Medicaid Other | Source: Ambulatory Visit | Attending: Obstetrics and Gynecology | Admitting: Obstetrics and Gynecology

## 2015-03-01 ENCOUNTER — Encounter (HOSPITAL_COMMUNITY): Payer: Self-pay | Admitting: *Deleted

## 2015-03-01 DIAGNOSIS — R0602 Shortness of breath: Secondary | ICD-10-CM | POA: Insufficient documentation

## 2015-03-01 DIAGNOSIS — R1013 Epigastric pain: Secondary | ICD-10-CM | POA: Diagnosis not present

## 2015-03-01 DIAGNOSIS — R079 Chest pain, unspecified: Secondary | ICD-10-CM | POA: Diagnosis present

## 2015-03-01 DIAGNOSIS — K219 Gastro-esophageal reflux disease without esophagitis: Secondary | ICD-10-CM | POA: Diagnosis not present

## 2015-03-01 DIAGNOSIS — O99612 Diseases of the digestive system complicating pregnancy, second trimester: Secondary | ICD-10-CM | POA: Insufficient documentation

## 2015-03-01 DIAGNOSIS — Z3A14 14 weeks gestation of pregnancy: Secondary | ICD-10-CM | POA: Insufficient documentation

## 2015-03-01 LAB — URINE MICROSCOPIC-ADD ON

## 2015-03-01 LAB — URINALYSIS, ROUTINE W REFLEX MICROSCOPIC
BILIRUBIN URINE: NEGATIVE
Glucose, UA: NEGATIVE mg/dL
Hgb urine dipstick: NEGATIVE
KETONES UR: 15 mg/dL — AB
NITRITE: NEGATIVE
Protein, ur: NEGATIVE mg/dL
Specific Gravity, Urine: 1.025 (ref 1.005–1.030)
UROBILINOGEN UA: 0.2 mg/dL (ref 0.0–1.0)
pH: 6.5 (ref 5.0–8.0)

## 2015-03-01 MED ORDER — FAMOTIDINE 20 MG PO TABS: 20.0000 mg | ORAL_TABLET | Freq: Two times a day (BID) | ORAL | Status: DC

## 2015-03-01 MED ORDER — GI COCKTAIL ~~LOC~~
30.0000 mL | Freq: Once | ORAL | Status: AC
  Administered 2015-03-01: 30 mL via ORAL
  Filled 2015-03-01: qty 30

## 2015-03-01 NOTE — MAU Note (Signed)
Urine in lab 

## 2015-03-01 NOTE — Discharge Instructions (Signed)
Gastroesophageal Reflux Disease, Adult °Gastroesophageal reflux disease (GERD) happens when acid from your stomach flows up into the esophagus. When acid comes in contact with the esophagus, the acid causes soreness (inflammation) in the esophagus. Over time, GERD may create small holes (ulcers) in the lining of the esophagus. °CAUSES  °· Increased body weight. This puts pressure on the stomach, making acid rise from the stomach into the esophagus. °· Smoking. This increases acid production in the stomach. °· Drinking alcohol. This causes decreased pressure in the lower esophageal sphincter (valve or ring of muscle between the esophagus and stomach), allowing acid from the stomach into the esophagus. °· Late evening meals and a full stomach. This increases pressure and acid production in the stomach. °· A malformed lower esophageal sphincter. °Sometimes, no cause is found. °SYMPTOMS  °· Burning pain in the lower part of the mid-chest behind the breastbone and in the mid-stomach area. This may occur twice a week or more often. °· Trouble swallowing. °· Sore throat. °· Dry cough. °· Asthma-like symptoms including chest tightness, shortness of breath, or wheezing. °DIAGNOSIS  °Your caregiver may be able to diagnose GERD based on your symptoms. In some cases, X-rays and other tests may be done to check for complications or to check the condition of your stomach and esophagus. °TREATMENT  °Your caregiver may recommend over-the-counter or prescription medicines to help decrease acid production. Ask your caregiver before starting or adding any new medicines.  °HOME CARE INSTRUCTIONS  °· Change the factors that you can control. Ask your caregiver for guidance concerning weight loss, quitting smoking, and alcohol consumption. °· Avoid foods and drinks that make your symptoms worse, such as: °¨ Caffeine or alcoholic drinks. °¨ Chocolate. °¨ Peppermint or mint flavorings. °¨ Garlic and onions. °¨ Spicy foods. °¨ Citrus fruits,  such as oranges, lemons, or limes. °¨ Tomato-based foods such as sauce, chili, salsa, and pizza. °¨ Fried and fatty foods. °· Avoid lying down for the 3 hours prior to your bedtime or prior to taking a nap. °· Eat small, frequent meals instead of large meals. °· Wear loose-fitting clothing. Do not wear anything tight around your waist that causes pressure on your stomach. °· Raise the head of your bed 6 to 8 inches with wood blocks to help you sleep. Extra pillows will not help. °· Only take over-the-counter or prescription medicines for pain, discomfort, or fever as directed by your caregiver. °· Do not take aspirin, ibuprofen, or other nonsteroidal anti-inflammatory drugs (NSAIDs). °SEEK IMMEDIATE MEDICAL CARE IF:  °· You have pain in your arms, neck, jaw, teeth, or back. °· Your pain increases or changes in intensity or duration. °· You develop nausea, vomiting, or sweating (diaphoresis). °· You develop shortness of breath, or you faint. °· Your vomit is green, yellow, black, or looks like coffee grounds or blood. °· Your stool is red, bloody, or black. °These symptoms could be signs of other problems, such as heart disease, gastric bleeding, or esophageal bleeding. °MAKE SURE YOU:  °· Understand these instructions. °· Will watch your condition. °· Will get help right away if you are not doing well or get worse. °Document Released: 02/21/2005 Document Revised: 08/06/2011 Document Reviewed: 12/01/2010 °ExitCare® Patient Information ©2015 [employer]. This information is not intended to replace advice given to you by your health care provider. Make sure you discuss any questions you have with your health care provider. ° °Bloating °Bloating is the feeling of fullness in your belly. You may feel as though   your pants are too tight. Often the cause of bloating is overeating, retaining fluids, or having gas in your bowel. It is also caused by swallowing air and eating foods that cause gas. Irritable bowel syndrome is  one of the most common causes of bloating. Constipation is also a common cause. Sometimes more serious problems can cause bloating. °SYMPTOMS  °Usually there is a feeling of fullness, as though your abdomen is bulged out. There may be mild discomfort.  °DIAGNOSIS  °Usually no particular testing is necessary for most bloating. If the condition persists and seems to become worse, your caregiver may do additional testing.  °TREATMENT  °· There is no direct treatment for bloating. °· Do not put gas into the bowel. Avoid chewing gum and sucking on candy. These tend to make you swallow air. Swallowing air can also be a nervous habit. Try to avoid this. °· Avoiding high residue diets will help. Eat foods with soluble fibers (examples include root vegetables, apples, or barley) and substitute dairy products with soy and rice products. This helps irritable bowel syndrome. °· If constipation is the cause, then a high residue diet with more fiber will help. °· Avoid carbonated beverages. °· Over-the-counter preparations are available that help reduce gas. Your pharmacist can help you with this. °SEEK MEDICAL CARE IF:  °· Bloating continues and seems to be getting worse. °· You notice a weight gain. °· You have a weight loss but the bloating is getting worse. °· You have changes in your bowel habits or develop nausea or vomiting. °SEEK IMMEDIATE MEDICAL CARE IF:  °· You develop shortness of breath or swelling in your legs. °· You have an increase in abdominal pain or develop chest pain. °Document Released: 03/14/2006 Document Revised: 08/06/2011 Document Reviewed: 05/02/2007 °ExitCare® Patient Information ©2015 [employer]. This information is not intended to replace advice given to you by your health care provider. Make sure you discuss any questions you have with your health care provider. ° °

## 2015-03-01 NOTE — MAU Note (Signed)
Upper abd pain for the past week, has SOB while at work, also states BP is high at work (148/72 today).  Also has chest pain, mostly mid chest & some to the R.  Pain does not radiate, states her R arm goes numb sometimes at work.  Had one spot of blood today.

## 2015-03-01 NOTE — MAU Provider Note (Signed)
History     CSN: [id number]  Arrival date and time: 03/01/15 1355   First Provider Initiated Contact with Patient 03/01/15 1433      Chief Complaint  Patient presents with  . Chest Pain  . Abdominal Pain  . Vaginal Bleeding   This is a 24 y.o. female at [redacted]w[redacted]d who presents with multiple somatic complaints. States she had upper abominal / epigastric pain with "a bloating feeling".  The "chest pain" she reports she states is this "full feeling" at epigastric area.    Also has shortness of breath while at work. When outside of work or at home never has this.  States when they take her BP at work, it is always high and when she takes it at home, it is normal.    Had a spot of light orange blood this morning. Told RN she has numbness in her right arm at work, did not tell me this. Admits to hypertension at the end of her last pregnancy.    Abdominal Cramping This is a recurrent problem. The current episode started today. The onset quality is gradual. The problem occurs intermittently. The problem has been unchanged. The pain is located in the epigastric region. The pain is mild. The quality of the pain is cramping and colicky. The abdominal pain does not radiate. Associated symptoms include belching. Pertinent negatives include no anorexia, constipation, diarrhea, dysuria, fever, frequency, headaches, myalgias, nausea or vomiting. Nothing aggravates the pain. The pain is relieved by nothing. She has tried nothing for the symptoms. There is no history of abdominal surgery, irritable bowel syndrome or PUD.   RN Note: Upper abd pain for the past week, has SOB while at work, also states BP is high at work (148/72 today). Also has chest pain, mostly mid chest & some to the R. Pain does not radiate, states her R arm goes numb sometimes at work. Had one spot of blood today. OB History    Gravida Para Term Preterm AB TAB SAB Ectopic Multiple Living   Past Medical History   Diagnosis Date  . Medical history non-contributory     Past Surgical History  Procedure Laterality Date  . No past surgeries      Family History  Problem Relation Age of Onset  . Heart disease Father 42    [facility]    Social History  Substance Use Topics  . Smoking status: Never Smoker   . Smokeless tobacco: Never Used  . Alcohol Use: No     Comment: occ    Allergies: No Known Allergies  Prescriptions prior to admission  Medication Sig Dispense Refill Last Dose  . acetaminophen (TYLENOL) 500 MG tablet Take 2 tablets (1,000 mg total) by mouth every 6 (six) hours as needed. (Patient taking differently: Take 1,000 mg by mouth every 6 (six) hours as needed for mild pain or headache. ) 30 tablet 0 Past Week at Unknown time  . Prenatal Vit-Fe Fumarate-FA (PRENATAL MULTIVITAMIN) TABS tablet Take 1 tablet by mouth daily.    02/05/2015 at Unknown time   Medical, Surgical, Family and Social histories reviewed and are listed above.  Medications and allergies reviewed.   Review of Systems  Constitutional: Negative for fever, chills and malaise/fatigue.  Respiratory: Positive for shortness of breath. Negative for cough, sputum production and wheezing.   Cardiovascular: Negative for chest pain.  Gastrointestinal: Positive for abdominal pain (bloated full feeling at  epigastrum). Negative for nausea, vomiting, diarrhea, constipation and anorexia.  Genitourinary: Negative for dysuria and frequency.  Musculoskeletal: Negative for myalgias.  Neurological: Negative for dizziness, weakness and headaches.  Other systems negative  Physical Exam   Blood pressure 134/80, pulse 95, temperature 98.3 F (36.8 C), temperature source Oral, resp. rate 18, last menstrual period 11/09/2014, SpO2 100 %.  Physical Exam  Constitutional: She is oriented to person, place, and time. She appears well-developed and well-nourished. No distress.  HENT:  Head: Normocephalic.  Neck: Normal range of motion. Neck  supple.  Cardiovascular: Normal rate, regular rhythm and normal heart sounds.  Exam reveals no gallop and no friction rub.   No murmur heard. Respiratory: Effort normal and breath sounds normal. No respiratory distress. She has no wheezes. She has no rales. She exhibits no tenderness.  GI: Soft. She exhibits no distension and no mass. There is tenderness (slightly tender over epigastrum). There is no rebound and no guarding.  Genitourinary: Vagina normal. No vaginal discharge (white thick discharge noted, no colored discharge seen. Cervix long and closed) found.  Fetal heart rate 147 per doppler  Musculoskeletal: Normal range of motion.  Neurological: She is alert and oriented to person, place, and time.  Skin: Skin is warm and dry. She is not diaphoretic.  Psychiatric: She has a normal mood and affect.    MAU Course  Procedures  MDM Consulted Dr Henderson Cloud regarding presentation, symptoms and exam findings. She recommends pepcid.   EKG ordered due to initial c/o chest pain Sinus rhythm with no ectopy  GI cocktail ordered and pepcid given UA checked due to report of orange discharge  Results for orders placed or performed during the hospital encounter of 03/01/15 (from the past 72 hour(s))  Urinalysis, Routine w reflex microscopic (not at [facility])     Status: Abnormal   Collection Time: 03/01/15  2:15 PM  Result Value Ref Range   Color, Urine YELLOW YELLOW   APPearance CLEAR CLEAR   Specific Gravity, Urine 1.025 1.005 - 1.030   pH 6.5 5.0 - 8.0   Glucose, UA NEGATIVE NEGATIVE mg/dL   Hgb urine dipstick NEGATIVE NEGATIVE   Bilirubin Urine NEGATIVE NEGATIVE   Ketones, ur 15 (A) NEGATIVE mg/dL   Protein, ur NEGATIVE NEGATIVE mg/dL   Urobilinogen, UA 0.2 0.0 - 1.0 mg/dL   Nitrite NEGATIVE NEGATIVE   Leukocytes, UA TRACE (A) NEGATIVE  Urine microscopic-add on     Status: Abnormal   Collection Time: 03/01/15  2:15 PM  Result Value Ref Range   Squamous Epithelial / LPF MANY (A) RARE    WBC, UA 0-2 <3 WBC/hpf   Bacteria, UA MANY (A) RARE   Crystals CA OXALATE CRYSTALS (A) NEGATIVE   Urine-Other AMORPHOUS URATES/PHOSPHATES      Assessment and Plan  A:  SIUP at [redacted]w[redacted]d       Reassuring fetal heart rate      Epigastric bloatiing and discomfort       Normal EKG       No evidence of vaginal bleeding       Intermittent shortness of breath at work, no SOB outside of work       Reported high stress life  P:  Discharge home       Rx Pepcid for acid reflux       Discussed probiotics and fiber in diet with good fluid intake        Followup in office for prenatal care   [facility] 03/01/2015, 2:34 PM

## 2015-03-26 ENCOUNTER — Inpatient Hospital Stay (HOSPITAL_COMMUNITY)
Admission: EM | Admit: 2015-03-26 | Discharge: 2015-03-26 | Disposition: A | Discharge status: Home / Self Care | Payer: Medicaid Other | Source: Ambulatory Visit | Attending: Obstetrics and Gynecology | Admitting: Obstetrics and Gynecology

## 2015-03-26 ENCOUNTER — Encounter (HOSPITAL_COMMUNITY): Payer: Self-pay | Admitting: *Deleted

## 2015-03-26 DIAGNOSIS — Z3A17 17 weeks gestation of pregnancy: Secondary | ICD-10-CM | POA: Insufficient documentation

## 2015-03-26 DIAGNOSIS — O26892 Other specified pregnancy related conditions, second trimester: Secondary | ICD-10-CM | POA: Insufficient documentation

## 2015-03-26 DIAGNOSIS — R109 Unspecified abdominal pain: Secondary | ICD-10-CM | POA: Diagnosis present

## 2015-03-26 DIAGNOSIS — W109XXA Fall (on) (from) unspecified stairs and steps, initial encounter: Secondary | ICD-10-CM | POA: Insufficient documentation

## 2015-03-26 DIAGNOSIS — M549 Dorsalgia, unspecified: Secondary | ICD-10-CM

## 2015-03-26 LAB — URINALYSIS, ROUTINE W REFLEX MICROSCOPIC
BILIRUBIN URINE: NEGATIVE
Glucose, UA: NEGATIVE mg/dL
HGB URINE DIPSTICK: NEGATIVE
Ketones, ur: NEGATIVE mg/dL
Leukocytes, UA: NEGATIVE
NITRITE: NEGATIVE
PH: 6 (ref 5.0–8.0)
Protein, ur: NEGATIVE mg/dL
SPECIFIC GRAVITY, URINE: 1.02 (ref 1.005–1.030)
Urobilinogen, UA: 0.2 mg/dL (ref 0.0–1.0)

## 2015-03-26 NOTE — Discharge Instructions (Signed)

## 2015-03-26 NOTE — MAU Provider Note (Signed)
  History     CSN: [id number]  Arrival date and time: 03/26/15 1831   None     Chief Complaint  Patient presents with  . Fall  . Abdominal Pain  . Back Pain   HPI  Kayla [name] 24 y.o. G2P1001 [id number] presents to the MAU stating that she fell yesterday on a stairstep and hurt her lower back. She denies hitting her abdomen, denies vaginal bleeding. Denies contractions or abdominal pain.  Past Medical History  Diagnosis Date  . Medical history non-contributory     Past Surgical History  Procedure Laterality Date  . No past surgeries      Family History  Problem Relation Age of Onset  . Heart disease Father 8044    [facility]    Social History  Substance Use Topics  . Smoking status: Never Smoker   . Smokeless tobacco: Never Used  . Alcohol Use: No     Comment: occ    Allergies: No Known Allergies  Prescriptions prior to admission  Medication Sig Dispense Refill Last Dose  . acetaminophen (TYLENOL) 500 MG tablet Take 2 tablets (1,000 mg total) by mouth every 6 (six) hours as needed. (Patient taking differently: Take 1,000 mg by mouth every 6 (six) hours as needed for mild pain or headache. ) 30 tablet 0 03/25/2015 at Unknown time  . famotidine (PEPCID) 20 MG tablet Take 1 tablet (20 mg total) by mouth 2 (two) times daily. 30 tablet 0 Past Month at Unknown time  . Prenatal Vit-Fe Fumarate-FA (PRENATAL MULTIVITAMIN) TABS tablet Take 1 tablet by mouth daily.    03/26/2015 at Unknown time    Review of Systems  Constitutional: Negative for fever.  Gastrointestinal: Negative for abdominal pain.  Musculoskeletal: Positive for back pain.   Physical Exam   Blood pressure 118/69, pulse 80, temperature 97.9 F (36.6 C), temperature source Oral, resp. rate 16, last menstrual period 11/09/2014.  Physical Exam  Nursing note and vitals reviewed. Constitutional: She is oriented to person, place, and time. She appears well-developed and well-nourished. No distress.  Neck: Normal  range of motion.  Cardiovascular: Normal rate.   Respiratory: Effort normal and breath sounds normal. No respiratory distress.  GI: Soft. There is no tenderness.  Musculoskeletal: Normal range of motion. She exhibits no edema.  Neurological: She is alert and oriented to person, place, and time.  Skin: Skin is warm and dry.  Psychiatric: She has a normal mood and affect. Her behavior is normal. Judgment and thought content normal.    MAU Course  Procedures  MDM Positive fht's; Pt has tried heating pad, ice and tylenol and had difficulty sleeping last night. POC discussed with Dr Dareen PianoAnderson; reccommends pt to see orthopeduic or chiropracter  Assessment and Plan  Back Pain  Discharge to home  [facility] SystemClemmons,Lori Grissett 03/26/2015, 7:22 PM

## 2015-03-26 NOTE — MAU Note (Signed)
Pt states she fell down stairs last night, landed on lower back, unable to sleep.  This morning started having pain in LLQ, "feels like ball tightening up."  Hurts when lying down or sitting.  Denies bleeding.

## 2015-05-11 ENCOUNTER — Encounter (HOSPITAL_COMMUNITY): Payer: Self-pay | Admitting: *Deleted

## 2015-05-11 ENCOUNTER — Inpatient Hospital Stay (HOSPITAL_COMMUNITY)
Admission: AD | Admit: 2015-05-11 | Discharge: 2015-05-12 | Disposition: A | Discharge status: Home / Self Care | Payer: Medicaid Other | Source: Ambulatory Visit | Attending: Obstetrics and Gynecology | Admitting: Obstetrics and Gynecology

## 2015-05-11 DIAGNOSIS — O4702 False labor before 37 completed weeks of gestation, second trimester: Secondary | ICD-10-CM | POA: Diagnosis not present

## 2015-05-11 DIAGNOSIS — R109 Unspecified abdominal pain: Secondary | ICD-10-CM | POA: Diagnosis present

## 2015-05-11 DIAGNOSIS — O479 False labor, unspecified: Secondary | ICD-10-CM | POA: Diagnosis not present

## 2015-05-11 DIAGNOSIS — L918 Other hypertrophic disorders of the skin: Secondary | ICD-10-CM | POA: Diagnosis not present

## 2015-05-11 DIAGNOSIS — Z3A24 24 weeks gestation of pregnancy: Secondary | ICD-10-CM | POA: Diagnosis not present

## 2015-05-11 LAB — URINALYSIS, ROUTINE W REFLEX MICROSCOPIC
BILIRUBIN URINE: NEGATIVE
Glucose, UA: NEGATIVE mg/dL
HGB URINE DIPSTICK: NEGATIVE
KETONES UR: NEGATIVE mg/dL
Leukocytes, UA: NEGATIVE
NITRITE: NEGATIVE
Protein, ur: NEGATIVE mg/dL
SPECIFIC GRAVITY, URINE: 1.02 (ref 1.005–1.030)
pH: 6.5 (ref 5.0–8.0)

## 2015-05-11 NOTE — MAU Note (Signed)
PT  SAYS SHE HAS UPPER  ABD  PAIN-  STARTED   AT 0700- PAIN  WOKE  HER UP-   PAIN WENT  AWAY   THEN  CAME  BACK  4 HRS  LATER-  THEN  AFTER  DRINKING  WATER -  WENT  AWAY  AGAIN.     THEN  CAME  BACK    TONIGHT   AT 1015PM-  NONE  NOW.    DENIES N/V/D.    [facility]- WITH  [facility].     ALSO  SAYS SHE HAS A   BUMP ON RIGHT  THIGH-   HAS BEEN THERE   SINCE 6 WEEKS  PREG- BUT  WAS SMALLER.-   NOW  IT  HURTS.

## 2015-05-11 NOTE — MAU Note (Signed)
Pt c/o mid abdominal cramping that started about 0700. States that it occurs every 4 hours-drinking water helps. Has not noticed a difference when eating. Denies vag bleeding. +FM Also has a bump on right thigh that she is concerned about. Hurts, especially when walking because thighs rub together.

## 2015-05-12 DIAGNOSIS — O479 False labor, unspecified: Secondary | ICD-10-CM | POA: Diagnosis not present

## 2015-05-12 NOTE — MAU Provider Note (Signed)
History     CSN: [id number]  Arrival date and time: 05/11/15 2302   First Provider Initiated Contact with Patient 05/12/15 0015      Chief Complaint  Patient presents with  . Abdominal Pain   HPI Comments: Kayla [name] is a 24 y.o. G2P1001 at [redacted]w[redacted]d who presents today with intermittent abdominal pain. She states that the pain started this morning, and she would feel tightening every 3-4 hours. The pain would resolve with drinking water. She denies any vaginal bleeding or LOF. She confirms fetal movement. She denies any recent intercourse.   She also has a "bump on her thigh". She states that the bump has been there since she was about [redacted] weeks pregnant. She states that it only hurts when she walks. When she isn't moving the pain is not there.   Abdominal Pain This is a new problem. The current episode started today. The onset quality is gradual. The problem occurs intermittently. The pain is located in the periumbilical region. The pain is at a severity of 9/10 (9/10 when the pain comes, but 0/10 in between. ). The quality of the pain is cramping. The abdominal pain does not radiate. Pertinent negatives include no constipation, diarrhea, dysuria, fever, frequency, nausea or vomiting. Nothing aggravates the pain. Relieved by: drinking lukewarm water.  She has tried nothing for the symptoms.    Past Medical History  Diagnosis Date  . Medical history non-contributory     Past Surgical History  Procedure Laterality Date  . No past surgeries      Family History  Problem Relation Age of Onset  . Heart disease Father 37    [facility]    Social History  Substance Use Topics  . Smoking status: Never Smoker   . Smokeless tobacco: Never Used  . Alcohol Use: No     Comment: occ    Allergies: No Known Allergies  Prescriptions prior to admission  Medication Sig Dispense Refill Last Dose  . Prenatal Vit-Fe Fumarate-FA (PRENATAL MULTIVITAMIN) TABS tablet Take 1 tablet by mouth daily.     05/10/2015 at Unknown time  . acetaminophen (TYLENOL) 500 MG tablet Take 2 tablets (1,000 mg total) by mouth every 6 (six) hours as needed. (Patient taking differently: Take 1,000 mg by mouth every 6 (six) hours as needed for mild pain or headache. ) 30 tablet 0 03/25/2015 at Unknown time  . famotidine (PEPCID) 20 MG tablet Take 1 tablet (20 mg total) by mouth 2 (two) times daily. 30 tablet 0 More than a month at Unknown time    Review of Systems  Constitutional: Negative for fever and chills.  Gastrointestinal: Positive for abdominal pain. Negative for nausea, vomiting, diarrhea and constipation.  Genitourinary: Negative for dysuria, urgency and frequency.   Physical Exam   Blood pressure 126/66, pulse 89, temperature 98.1 F (36.7 C), temperature source Oral, resp. rate 18, height  (1.575 m), weight 74.39 kg (164 lb), last menstrual period 11/09/2014, SpO2 99 %.  Physical Exam  Nursing note and vitals reviewed. Constitutional: She is oriented to person, place, and time. She appears well-developed and well-nourished. No distress.  HENT:  Head: Normocephalic.  Cardiovascular: Normal rate.   Respiratory: Effort normal.  GI: Soft. There is no tenderness. There is no rebound.  Genitourinary:  0.5cmx0.5cm flesh colored bump on inner, right thigh.  Cervix: closed/thick/high   Neurological: She is alert and oriented to person, place, and time.  Skin: Skin is warm and dry.  Psychiatric: She has a  normal mood and affect.  FHT: 135, moderate with 10x10, tracing is broken up 2/2 visible fetal movement Toco: no UCs   MAU Course  Procedures  MDM 0052: D/[facility] Dr[facility] Tenny Crawoss,  [facility].  Assessment and Plan   1. Braxton Hicks contractions   2. Skin tag    DC home Comfort measures reviewed  2nd Trimester precautions  PTL precautions  Fetal kick counts RX: none  Return to MAU as needed FU with OB as planned  Follow-up Information    Follow up with Almon HerculesOSS,KENDRA H., MD.    Specialty:  Obstetrics and Gynecology   Why:  As scheduled   Contact information:   [address] [place] [id number] [phone]         [name], [name] 05/12/2015, 12:17 AM

## 2015-05-12 NOTE — Discharge Instructions (Signed)
Braxton Hicks Contractions °Contractions of the uterus can occur throughout pregnancy. Contractions are not always a sign that you are in labor.  °WHAT ARE BRAXTON HICKS CONTRACTIONS?  °Contractions that occur before labor are called Braxton Hicks contractions, or false labor. Toward the end of pregnancy (32-34 weeks), these contractions can develop more often and may become more forceful. This is not true labor because these contractions do not result in opening (dilatation) and thinning of the cervix. They are sometimes difficult to tell apart from true labor because these contractions can be forceful and people have different pain tolerances. You should not feel embarrassed if you go to the hospital with false labor. Sometimes, the only way to tell if you are in true labor is for your health care provider to look for changes in the cervix. °If there are no prenatal problems or other health problems associated with the pregnancy, it is completely safe to be sent home with false labor and await the onset of true labor. °HOW CAN YOU TELL THE DIFFERENCE BETWEEN TRUE AND FALSE LABOR? °False Labor °· The contractions of false labor are usually shorter and not as hard as those of true labor.   °· The contractions are usually irregular.   °· The contractions are often felt in the front of the lower abdomen and in the groin.   °· The contractions may go away when you walk around or change positions while lying down.   °· The contractions get weaker and are shorter lasting as time goes on.   °· The contractions do not usually become progressively stronger, regular, and closer together as with true labor.   °True Labor °· Contractions in true labor last 30-70 seconds, become very regular, usually become more intense, and increase in frequency.   °· The contractions do not go away with walking.   °· The discomfort is usually felt in the top of the uterus and spreads to the lower abdomen and low back.   °· True labor can be  determined by your health care provider with an exam. This will show that the cervix is dilating and getting thinner.   °WHAT TO REMEMBER °· Keep up with your usual exercises and follow other instructions given by your health care provider.   °· Take medicines as directed by your health care provider.   °· Keep your regular prenatal appointments.   °· Eat and drink lightly if you think you are going into labor.   °· If Braxton Hicks contractions are making you uncomfortable:   °¨ Change your position from lying down or resting to walking, or from walking to resting.   °¨ Sit and rest in a tub of warm water.   °¨ Drink 2-3 glasses of water. Dehydration may cause these contractions.   °¨ Do slow and deep breathing several times an hour.   °WHEN SHOULD I SEEK IMMEDIATE MEDICAL CARE? °Seek immediate medical care if: °· Your contractions become stronger, more regular, and closer together.   °· You have fluid leaking or gushing from your vagina.   °· You have a fever.   °· You pass blood-tinged mucus.   °· You have vaginal bleeding.   °· You have continuous abdominal pain.   °· You have low back pain that you never had before.   °· You feel your baby's head pushing down and causing pelvic pressure.   °· Your baby is not moving as much as it used to.   °  °This information is not intended to replace advice given to you by your health care provider. Make sure you discuss any questions you have with your health care   provider. °  °Document Released: 05/14/2005 Document Revised: 05/19/2013 Document Reviewed: 02/23/2013 °Elsevier Interactive Patient Education ©2016 [employer] ° °

## 2015-05-19 ENCOUNTER — Encounter (HOSPITAL_COMMUNITY): Payer: Self-pay

## 2015-05-19 ENCOUNTER — Inpatient Hospital Stay (HOSPITAL_COMMUNITY)
Admission: AD | Admit: 2015-05-19 | Discharge: 2015-05-19 | Disposition: A | Discharge status: Home / Self Care | Payer: Medicaid Other | Source: Ambulatory Visit | Attending: Obstetrics and Gynecology | Admitting: Obstetrics and Gynecology

## 2015-05-19 DIAGNOSIS — O368120 Decreased fetal movements, second trimester, not applicable or unspecified: Secondary | ICD-10-CM | POA: Diagnosis present

## 2015-05-19 DIAGNOSIS — Z3A25 25 weeks gestation of pregnancy: Secondary | ICD-10-CM | POA: Insufficient documentation

## 2015-05-19 DIAGNOSIS — O9989 Other specified diseases and conditions complicating pregnancy, childbirth and the puerperium: Secondary | ICD-10-CM

## 2015-05-19 DIAGNOSIS — O26892 Other specified pregnancy related conditions, second trimester: Secondary | ICD-10-CM | POA: Insufficient documentation

## 2015-05-19 DIAGNOSIS — O26899 Other specified pregnancy related conditions, unspecified trimester: Secondary | ICD-10-CM

## 2015-05-19 DIAGNOSIS — R109 Unspecified abdominal pain: Secondary | ICD-10-CM | POA: Diagnosis not present

## 2015-05-19 LAB — URINALYSIS, ROUTINE W REFLEX MICROSCOPIC
Bilirubin Urine: NEGATIVE
GLUCOSE, UA: NEGATIVE mg/dL
HGB URINE DIPSTICK: NEGATIVE
Ketones, ur: 15 mg/dL — AB
Leukocytes, UA: NEGATIVE
Nitrite: NEGATIVE
PH: 6 (ref 5.0–8.0)
PROTEIN: NEGATIVE mg/dL

## 2015-05-19 NOTE — MAU Provider Note (Signed)
History     CSN: [id number]  Arrival date and time: 05/19/15 1752   First Provider Initiated Contact with Patient 05/19/15 1848      Chief Complaint  Patient presents with  . Abdominal Cramping   HPI Kayla [name] 24 y.o. G2P1001  presents to MAU complains of abdominal cramping and decreased fetal movement.  The cramping comes and goes, is 9/10 but is not occurring presently.  It encompasses the entire abdomen.  She states it does not feel like contractions although she does feel pressure.  No improvement with rest or drinking water, although she notes she has not drank anything today as she was travelling from [place] and did not want to have bathroom stops.  For the last 3 days, she has only felt the baby move once or twice a day - even after eating.  She notes she is eating well, denies nausea, vomiting, fever, weakness, vaginal bleeding or LOF, dysuria.  Last IC was a couple days ago. OB History    Gravida Para Term Preterm AB TAB SAB Ectopic Multiple Living   Past Medical History  Diagnosis Date  . Medical history non-contributory     Past Surgical History  Procedure Laterality Date  . No past surgeries      Family History  Problem Relation Age of Onset  . Heart disease Father 69    [facility]    Social History  Substance Use Topics  . Smoking status: Never Smoker   . Smokeless tobacco: Never Used  . Alcohol Use: No     Comment: occ    Allergies: No Known Allergies  Prescriptions prior to admission  Medication Sig Dispense Refill Last Dose  . acetaminophen (TYLENOL) 500 MG tablet Take 2 tablets (1,000 mg total) by mouth every 6 (six) hours as needed. (Patient taking differently: Take 1,000 mg by mouth every 6 (six) hours as needed for mild pain or headache. ) 30 tablet 0 03/25/2015 at Unknown time  . famotidine (PEPCID) 20 MG tablet Take 1 tablet (20 mg total) by mouth 2 (two) times daily. 30 tablet 0 More than a month at Unknown time  .  Prenatal Vit-Fe Fumarate-FA (PRENATAL MULTIVITAMIN) TABS tablet Take 1 tablet by mouth daily.    05/10/2015 at Unknown time    ROS Pertinent ROS in HPI.  All other systems are negative.   Physical Exam   Blood pressure 140/74, pulse 114, temperature 98.2 F (36.8 C), resp. rate 18, last menstrual period 11/09/2014.  Physical Exam  Constitutional: She is oriented to person, place, and time. She appears well-developed and well-nourished.  HENT:  Head: Normocephalic and atraumatic.  Eyes: Conjunctivae and EOM are normal.  Neck: Normal range of motion. Neck supple.  Cardiovascular: Normal rate, regular rhythm and normal heart sounds.   Respiratory: Effort normal and breath sounds normal. No respiratory distress.  GI: Soft. She exhibits no distension. There is tenderness. There is no rebound and no guarding.  Mildly tender to palpation in RLQ  Genitourinary:  Cervix is thick, closed  Musculoskeletal: Normal range of motion.  Neurological: She is alert and oriented to person, place, and time.  Skin: Skin is warm and dry.  Psychiatric: She has a normal mood and affect. Her behavior is normal.   Results for orders placed or performed during the hospital encounter of 05/19/15 (from the past 24 hour(s))  Urinalysis, Routine w reflex microscopic (not at  [facility])     Status: Abnormal   Collection Time: 05/19/15  6:00 PM  Result Value Ref Range   Color, Urine YELLOW YELLOW   APPearance HAZY (A) CLEAR   Specific Gravity, Urine >1.030 (H) 1.005 - 1.030   pH 6.0 5.0 - 8.0   Glucose, UA NEGATIVE NEGATIVE mg/dL   Hgb urine dipstick NEGATIVE NEGATIVE   Bilirubin Urine NEGATIVE NEGATIVE   Ketones, ur 15 (A) NEGATIVE mg/dL   Protein, ur NEGATIVE NEGATIVE mg/dL   Nitrite NEGATIVE NEGATIVE   Leukocytes, UA NEGATIVE NEGATIVE    MAU Course  Procedures  MDM Fetal movement noted by provider on exam and pt acknowledges.   Fetal tracing cannot be called reactive although no decels and no  contractions noted.   U/A suggests pt needs to drink.  Pt given ginger ale and water.  She is able to keep this down without issue.   Pt not experiencing pain/cramping while in MAU. All of the above discussed with Dr. Claiborne Billingsallahan.  She advises to push fluids, monitor for a bit longer and then okay to discharge if without incident.     Assessment and Plan  A: abdominal pain in preg, 2nd trimester  P: Discharge to home PO hydration strongly encouraged PTL precautions explained  Keep f/u OB appts in office Patient may return to MAU as needed or if her condition were to change or worsen   Bertram DenverKaren E Teague Clark 05/19/2015, 6:49 PM

## 2015-05-19 NOTE — MAU Note (Signed)
Pt presents to MAU with complaints of lower abdominal cramping that started yesterday. Denies any vaginal bleeding or abnormal discharge

## 2015-05-19 NOTE — Discharge Instructions (Signed)

## 2015-05-29 NOTE — L&D Delivery Note (Signed)
Operative Delivery Note At 5:16 PM a viable and healthy female was delivered via .  Presentation: vertex; Position: Right,, Occiput,, Anterior; Station: +5.  Pt delivered the infants head to crowning. Following restitution the anterior shoulder did not easily deliver. A shoulder dystocia was called and the patient was placed in [place]ts and suprapubic pressure was applied and the anterior shoulder easily delivered. The shoulder dystocia lasted 10 seconds. THe placenta delivered spontaneously & intact. No lacerations required repair. Mom and baby are doing well after delivery  Delivery of the head: Mc[place]ts & Suprapubic  APGAR: 7 , 9; weight pending  .   Placenta status: spontaneous , intact .   Cord: 3V   Anesthesia: Epidural  Episiotomy:  None Lacerations: NOne  Suture Repair: None Est. Blood Loss (mL):  50 cc  Mom to postpartum.  Baby to Couplet care / Skin to Skin.  Jennene Downie H. 09/01/2015, 5:30 PM

## 2015-08-03 ENCOUNTER — Other Ambulatory Visit: Payer: Self-pay | Admitting: Obstetrics and Gynecology

## 2015-08-03 LAB — OB RESULTS CONSOLE GBS: STREP GROUP B AG: NEGATIVE

## 2015-08-09 ENCOUNTER — Encounter (HOSPITAL_COMMUNITY): Payer: Self-pay | Admitting: *Deleted

## 2015-08-09 ENCOUNTER — Inpatient Hospital Stay (HOSPITAL_COMMUNITY)
Admission: AD | Admit: 2015-08-09 | Discharge: 2015-08-09 | Disposition: A | Discharge status: Home / Self Care | Payer: Medicaid Other | Source: Ambulatory Visit | Attending: Obstetrics and Gynecology | Admitting: Obstetrics and Gynecology

## 2015-08-09 DIAGNOSIS — O26893 Other specified pregnancy related conditions, third trimester: Secondary | ICD-10-CM | POA: Diagnosis not present

## 2015-08-09 DIAGNOSIS — O26899 Other specified pregnancy related conditions, unspecified trimester: Secondary | ICD-10-CM

## 2015-08-09 DIAGNOSIS — O133 Gestational [pregnancy-induced] hypertension without significant proteinuria, third trimester: Secondary | ICD-10-CM

## 2015-08-09 DIAGNOSIS — R109 Unspecified abdominal pain: Secondary | ICD-10-CM

## 2015-08-09 DIAGNOSIS — R030 Elevated blood-pressure reading, without diagnosis of hypertension: Secondary | ICD-10-CM | POA: Diagnosis not present

## 2015-08-09 DIAGNOSIS — O9989 Other specified diseases and conditions complicating pregnancy, childbirth and the puerperium: Secondary | ICD-10-CM | POA: Diagnosis not present

## 2015-08-09 DIAGNOSIS — R079 Chest pain, unspecified: Secondary | ICD-10-CM | POA: Diagnosis not present

## 2015-08-09 DIAGNOSIS — K219 Gastro-esophageal reflux disease without esophagitis: Secondary | ICD-10-CM

## 2015-08-09 DIAGNOSIS — Z3A37 37 weeks gestation of pregnancy: Secondary | ICD-10-CM | POA: Insufficient documentation

## 2015-08-09 DIAGNOSIS — O169 Unspecified maternal hypertension, unspecified trimester: Secondary | ICD-10-CM

## 2015-08-09 LAB — COMPREHENSIVE METABOLIC PANEL
ALT: 12 U/L — ABNORMAL LOW (ref 14–54)
ANION GAP: 3 — AB (ref 5–15)
AST: 17 U/L (ref 15–41)
Albumin: 3.4 g/dL — ABNORMAL LOW (ref 3.5–5.0)
Alkaline Phosphatase: 96 U/L (ref 38–126)
BUN: 6 mg/dL (ref 6–20)
CALCIUM: 9.5 mg/dL (ref 8.9–10.3)
CHLORIDE: 111 mmol/L (ref 101–111)
CO2: 22 mmol/L (ref 22–32)
Creatinine, Ser: 0.54 mg/dL (ref 0.44–1.00)
GFR calc non Af Amer: >60 mL/min (ref >60)
Glucose, Bld: 110 mg/dL — ABNORMAL HIGH (ref 65–99)
POTASSIUM: 3.8 mmol/L (ref 3.5–5.1)
SODIUM: 136 mmol/L (ref 135–145)
TOTAL PROTEIN: 6.4 g/dL — AB (ref 6.5–8.1)
Total Bilirubin: 0.4 mg/dL (ref 0.3–1.2)

## 2015-08-09 LAB — URINALYSIS, ROUTINE W REFLEX MICROSCOPIC
BILIRUBIN URINE: NEGATIVE
Glucose, UA: 250 mg/dL — AB
HGB URINE DIPSTICK: NEGATIVE
KETONES UR: 15 mg/dL — AB
Leukocytes, UA: NEGATIVE
Nitrite: NEGATIVE
PROTEIN: NEGATIVE mg/dL
Specific Gravity, Urine: 1.01 (ref 1.005–1.030)
pH: 6.5 (ref 5.0–8.0)

## 2015-08-09 LAB — CBC
HEMATOCRIT: 35.6 % — AB (ref 36.0–46.0)
HEMOGLOBIN: 12.4 g/dL (ref 12.0–15.0)
MCH: 31.1 pg (ref 26.0–34.0)
MCHC: 34.8 g/dL (ref 30.0–36.0)
MCV: 89.2 fL (ref 78.0–100.0)
Platelets: 205 10*3/uL (ref 150–400)
RBC: 3.99 MIL/uL (ref 3.87–5.11)
RDW: 14.1 % (ref 11.5–15.5)
WBC: 15.5 10*3/uL — AB (ref 4.0–10.5)

## 2015-08-09 LAB — PROTEIN / CREATININE RATIO, URINE
CREATININE, URINE: 82 mg/dL
PROTEIN CREATININE RATIO: 0.09 mg/mg{creat} (ref 0.00–0.15)
TOTAL PROTEIN, URINE: 7 mg/dL

## 2015-08-09 NOTE — MAU Note (Addendum)
PT  SAYS   ABD  PAIN  - STARTED  4 PM.  -UC,  WHEN HAS UC -  HER  CHEST  HURTS -   . IF  NO UC  - NO CP.    PNC-  WITH [facility]. - VE LAST  WED- 3   CM.    DENIES HSV AND MRSA.   GBS-   UNSURE.       ALSO AT  330PM-   BEFORE  RESTROOM-  SAW   PUDDLE OF  WETNESS IN  UNDERWEAR.   NONE  SINCE.

## 2015-08-09 NOTE — MAU Provider Note (Signed)
History    CSN: [id number] Arrival date and time: 08/09/15 6010 First Provider Initiated Contact with Patient 08/09/15 1949     Chief Complaint  Patient presents with  . Contractions   HPI  Patient is 25 y.o. G2P1001 70w0dhere with complaints of contractions and chest pain associated with contractions.  Chest pain is burning sensation that rises in her chest. Report history of GERD and this feels like that. Denies HA, blurry vision, changes in vision, RUQ pain, swelling.   +FM, denies LOF, VB, contractions, vaginal discharge.   Report to RN:  PT SAYS ABD PAIN - STARTED 4 PM. -UC, WHEN HAS UC - HER CHEST HURTS - . IF NO UC - NO CP. PNC- WITH [facility]. - VE LAST WED- 3 CM. DENIES HSV AND MRSA. GBS- UNSURE. ALSO AT 330PM- BEFORE RESTROOM- SAW PUDDLE OF WETNESS IN UNDERWEAR. NONE SINCE.     OB History    Gravida Para Term Preterm AB TAB SAB Ectopic Multiple Living   _0 Past Medical History  Diagnosis Date  . Medical history non-contributory     Past Surgical History  Procedure Laterality Date  . No past surgeries      Family History  Problem Relation Age of Onset  . Heart disease Father 435   [facility]    Social History  Substance Use Topics  . Smoking status: Never Smoker   . Smokeless tobacco: Never Used  . Alcohol Use: No     Comment: occ    Allergies: No Known Allergies  Prescriptions prior to admission  Medication Sig Dispense Refill Last Dose  . Prenatal Vit-Fe Fumarate-FA (PRENATAL MULTIVITAMIN) TABS tablet Take 1 tablet by mouth daily.    08/08/2015 at Unknown time    Review of Systems  Constitutional: Negative for fever and chills.  Eyes: Negative for blurred vision and double vision.  Respiratory: Negative for cough and shortness of breath.   Cardiovascular: Negative for chest pain and orthopnea.  Gastrointestinal: Negative for nausea and vomiting.  Genitourinary: Negative for dysuria, frequency and flank pain.  Musculoskeletal:  Negative for myalgias.  Skin: Negative for rash.  Neurological: Negative for dizziness, tingling, weakness and headaches.  Endo/Heme/Allergies: Does not bruise/bleed easily.  Psychiatric/Behavioral: Negative for depression and suicidal ideas. The patient is not nervous/anxious.    Physical Exam   Blood pressure 131/70, pulse 109, temperature 98.8 F (37.1 C), temperature source Oral, resp. rate 20, height _1  (1.549 m), weight 175 lb 2 oz (79.436 kg), last menstrual period 11/09/2014, SpO2 99 %.  Physical Exam  Nursing note and vitals reviewed. Constitutional: She is oriented to person, place, and time. She appears well-developed and well-nourished. No distress.  Pregnant female  HENT:  Head: Normocephalic and atraumatic.  Eyes: Conjunctivae are normal. No scleral icterus.  Neck: Normal range of motion. Neck supple.  Cardiovascular: Normal rate and intact distal pulses.   Respiratory: Effort normal. She exhibits no tenderness.  GI: Soft. There is no tenderness. There is no rebound and no guarding.  Gravid  Genitourinary: Vagina normal.  Musculoskeletal: Normal range of motion. She exhibits no edema.  Neurological: She is alert and oriented to person, place, and time.  Skin: Skin is warm and dry. No rash noted.  Psychiatric: She has a normal mood and affect.   Dilation: 2.5 Effacement (%): Thick Cervical Position: Posterior Exam by:: Dr. NErnestina Patches MAU Course  Procedures  MDM- elevated  BP at term, concerning for gHTN but does not meet criteria as I have not had two BP > 6 hours apart. [place] work up performed.   NST 140/mod/+accels, no decels Toco q2-4 minutes  Results for orders placed or performed during the hospital encounter of 08/09/15 (from the past 48 hour(s))  Urinalysis, Routine w reflex microscopic (not at [facility])     Status: Abnormal   Collection Time: 08/09/15  7:18 PM  Result Value Ref Range   Color, Urine YELLOW YELLOW   APPearance CLEAR CLEAR   Specific  Gravity, Urine 1.010 1.005 - 1.030   pH 6.5 5.0 - 8.0   Glucose, UA 250 (A) NEGATIVE mg/dL   Hgb urine dipstick NEGATIVE NEGATIVE   Bilirubin Urine NEGATIVE NEGATIVE   Ketones, ur 15 (A) NEGATIVE mg/dL   Protein, ur NEGATIVE NEGATIVE mg/dL   Nitrite NEGATIVE NEGATIVE   Leukocytes, UA NEGATIVE NEGATIVE    Comment: MICROSCOPIC NOT DONE ON URINES WITH NEGATIVE PROTEIN, BLOOD, LEUKOCYTES, NITRITE, OR GLUCOSE <1000 mg/dL.  Protein / creatinine ratio, urine     Status: None   Collection Time: 08/09/15  7:18 PM  Result Value Ref Range   Creatinine, Urine 82.00 mg/dL   Total Protein, Urine 7 mg/dL    Comment: NO NORMAL RANGE ESTABLISHED FOR THIS TEST   Protein Creatinine Ratio 0.09 0.00 - 0.15 mg/mg[Cre]  Comprehensive metabolic panel     Status: Abnormal   Collection Time: 08/09/15  8:11 PM  Result Value Ref Range   Sodium 136 135 - 145 mmol/L   Potassium 3.8 3.5 - 5.1 mmol/L   Chloride 111 101 - 111 mmol/L   CO2 22 22 - 32 mmol/L   Glucose, Bld 110 (H) 65 - 99 mg/dL   BUN 6 6 - 20 mg/dL   Creatinine, Ser 0.54 0.44 - 1.00 mg/dL   Calcium 9.5 8.9 - 10.3 mg/dL   Total Protein 6.4 (L) 6.5 - 8.1 g/dL   Albumin 3.4 (L) 3.5 - 5.0 g/dL   AST 17 15 - 41 U/L   ALT 12 (L) 14 - 54 U/L   Alkaline Phosphatase 96 38 - 126 U/L   Total Bilirubin 0.4 0.3 - 1.2 mg/dL   GFR calc non Af Amer >60 >60 mL/min   GFR calc Af Amer >60 >60 mL/min    Comment: (NOTE) The eGFR has been calculated using the CKD EPI equation. This calculation has not been validated in all clinical situations. eGFR's persistently <60 mL/min signify possible Chronic Kidney Disease.    Anion gap 3 (L) 5 - 15  CBC     Status: Abnormal   Collection Time: 08/09/15  8:11 PM  Result Value Ref Range   WBC 15.5 (H) 4.0 - 10.5 K/uL   RBC 3.99 3.87 - 5.11 MIL/uL   Hemoglobin 12.4 12.0 - 15.0 g/dL   HCT 35.6 (L) 36.0 - 46.0 %   MCV 89.2 78.0 - 100.0 fL   MCH 31.1 26.0 - 34.0 pg   MCHC 34.8 30.0 - 36.0 g/dL   RDW 14.1 11.5 - 15.5 %    Platelets 205 150 - 400 K/uL    Assessment and Plan  THEONE BOWELL is a 25 y.o. [id number] at 61w0duncomplicated pregnancy  #Chest pain -likely GERD - recommended OTC TUMS  #Contractions - cervix unchanged - home with labor precautions  #Elevated BP, no gHTN or PreX currently - PIH labs negative - Follow at primary OB office in 2-3 days for repeat BP check -  reviewed warning s/sx of preeclampsia  Caren Macadam 08/09/2015, 9:20 PM

## 2015-08-09 NOTE — Discharge Instructions (Signed)
Come to the MAU (maternity admission unit) for 1) Strong contractions every 2-3 minutes for at least 1 hour that do no go away when you drink water or take a warm shower. These contractions will be so strong all you can do is breath through them 2) Vaginal bleeding- anything more than spotting 3) Loss of fluid like you broke your water 4) Decreased movement of your baby  Preeclampsia and Eclampsia (YOU DO NOT HAVE THIS right now) Preeclampsia is a serious condition that develops only during pregnancy. It is also called toxemia of pregnancy. This condition causes high blood pressure along with other symptoms, such as swelling and headaches. These may develop as the condition gets worse. Preeclampsia may occur 20 weeks or later into your pregnancy.  Diagnosing and treating preeclampsia early is very important. If not treated early, it can cause serious problems for you and your baby. One problem it can lead to is eclampsia, which is a condition that causes muscle jerking or shaking (convulsions) in the mother. Delivering your baby is the best treatment for preeclampsia or eclampsia.  RISK FACTORS The cause of preeclampsia is not known. You may be more likely to develop preeclampsia if you have certain risk factors. These include:   Being pregnant for the first time.  Having preeclampsia in a past pregnancy.  Having a family history of preeclampsia.  Having high blood pressure.  Being pregnant with twins or triplets.  Being 34 or older.  Being African American.  Having kidney disease or diabetes.  Having medical conditions such as lupus or blood diseases.  Being very overweight (obese). SIGNS AND SYMPTOMS  The earliest signs of preeclampsia are:  High blood pressure.  Increased protein in your urine. Your health care provider will check for this at every prenatal visit. Other symptoms that can develop include:   Severe headaches.  Sudden weight gain.  Swelling of your hands,  face, legs, and feet.  Feeling sick to your stomach (nauseous) and throwing up (vomiting).  Vision problems (blurred or double vision).  Numbness in your face, arms, legs, and feet.  Dizziness.  Slurred speech.  Sensitivity to bright lights.  Abdominal pain. DIAGNOSIS  There are no screening tests for preeclampsia. Your health care provider will ask you about symptoms and check for signs of preeclampsia during your prenatal visits. You may also have tests, including:  Urine testing.  Blood testing.  Checking your baby's heart rate.  Checking the health of your baby and your placenta using images created with sound waves (ultrasound). TREATMENT  You can work out the best treatment approach together with your health care provider. It is very important to keep all prenatal appointments. If you have an increased risk of preeclampsia, you may need more frequent prenatal exams.  Your health care provider may prescribe bed rest.  You may have to eat as little salt as possible.  You may need to take medicine to lower your blood pressure if the condition does not respond to more conservative measures.  You may need to stay in the hospital if your condition is severe. There, treatment will focus on controlling your blood pressure and fluid retention. You may also need to take medicine to prevent seizures.  If the condition gets worse, your baby may need to be delivered early to protect you and the baby. You may have your labor started with medicine (be induced), or you may have a cesarean delivery.  Preeclampsia usually goes away after the baby is born.  HOME CARE INSTRUCTIONS   Only take over-the-counter or prescription medicines as directed by your health care provider.  Lie on your left side while resting. This keeps pressure off your baby.  Elevate your feet while resting.  Get regular exercise. Ask your health care provider what type of exercise is safe for you.  Avoid  caffeine and alcohol.  Do not smoke.  Drink 6-8 glasses of water every day.  Eat a balanced diet that is low in salt. Do not add salt to your food.  Avoid stressful situations as much as possible.  Get plenty of rest and sleep.  Keep all prenatal appointments and tests as scheduled. SEEK MEDICAL CARE IF:  You are gaining more weight than expected.  You have any headaches, abdominal pain, or nausea.  You are bruising more than usual.  You feel dizzy or light-headed. SEEK IMMEDIATE MEDICAL CARE IF:   You develop sudden or severe swelling anywhere in your body. This usually happens in the legs.  You gain 5 lb (2.3 kg) or more in a week.  You have a severe headache, dizziness, problems with your vision, or confusion.  You have severe abdominal pain.  You have lasting nausea or vomiting.  You have a seizure.  You have trouble moving any part of your body.  You develop numbness in your body.  You have trouble speaking.  You have any abnormal bleeding.  You develop a stiff neck.  You pass out. MAKE SURE YOU:   Understand these instructions.  Will watch your condition.  Will get help right away if you are not doing well or get worse.   This information is not intended to replace advice given to you by your health care provider. Make sure you discuss any questions you have with your health care provider.   Document Released: 05/11/2000 Document Revised: 05/19/2013 Document Reviewed: 03/06/2013 Elsevier Interactive Patient Education [employer]2016 [employer] Inc.

## 2015-08-25 ENCOUNTER — Inpatient Hospital Stay (HOSPITAL_COMMUNITY)
Admission: AD | Admit: 2015-08-25 | Discharge: 2015-08-25 | Disposition: A | Discharge status: Home / Self Care | Payer: Medicaid Other | Source: Ambulatory Visit | Attending: Obstetrics and Gynecology | Admitting: Obstetrics and Gynecology

## 2015-08-25 ENCOUNTER — Encounter (HOSPITAL_COMMUNITY): Payer: Self-pay | Admitting: *Deleted

## 2015-08-25 DIAGNOSIS — Z3A39 39 weeks gestation of pregnancy: Secondary | ICD-10-CM | POA: Insufficient documentation

## 2015-08-25 DIAGNOSIS — I10 Essential (primary) hypertension: Secondary | ICD-10-CM | POA: Diagnosis present

## 2015-08-25 DIAGNOSIS — O163 Unspecified maternal hypertension, third trimester: Secondary | ICD-10-CM

## 2015-08-25 DIAGNOSIS — O133 Gestational [pregnancy-induced] hypertension without significant proteinuria, third trimester: Secondary | ICD-10-CM | POA: Diagnosis not present

## 2015-08-25 DIAGNOSIS — R109 Unspecified abdominal pain: Secondary | ICD-10-CM

## 2015-08-25 DIAGNOSIS — O26899 Other specified pregnancy related conditions, unspecified trimester: Secondary | ICD-10-CM

## 2015-08-25 LAB — URINALYSIS, ROUTINE W REFLEX MICROSCOPIC
Bilirubin Urine: NEGATIVE
Glucose, UA: NEGATIVE mg/dL
Hgb urine dipstick: NEGATIVE
Ketones, ur: NEGATIVE mg/dL
NITRITE: NEGATIVE
PROTEIN: NEGATIVE mg/dL
SPECIFIC GRAVITY, URINE: 1.01 (ref 1.005–1.030)
pH: 6.5 (ref 5.0–8.0)

## 2015-08-25 LAB — URINE MICROSCOPIC-ADD ON: RBC / HPF: NONE SEEN RBC/hpf (ref 0–5)

## 2015-08-25 LAB — COMPREHENSIVE METABOLIC PANEL
ALT: 12 U/L — ABNORMAL LOW (ref 14–54)
AST: 17 U/L (ref 15–41)
Albumin: 3.4 g/dL — ABNORMAL LOW (ref 3.5–5.0)
Alkaline Phosphatase: 131 U/L — ABNORMAL HIGH (ref 38–126)
Anion gap: 11 (ref 5–15)
BUN: 5 mg/dL — ABNORMAL LOW (ref 6–20)
CO2: 20 mmol/L — ABNORMAL LOW (ref 22–32)
Calcium: 9 mg/dL (ref 8.9–10.3)
Chloride: 104 mmol/L (ref 101–111)
Creatinine, Ser: 0.46 mg/dL (ref 0.44–1.00)
GFR calc Af Amer: >60 mL/min (ref >60)
GFR calc non Af Amer: >60 mL/min (ref >60)
Glucose, Bld: 73 mg/dL (ref 65–99)
Potassium: 4 mmol/L (ref 3.5–5.1)
Sodium: 135 mmol/L (ref 135–145)
Total Bilirubin: 0.5 mg/dL (ref 0.3–1.2)
Total Protein: 6.8 g/dL (ref 6.5–8.1)

## 2015-08-25 LAB — CBC
HCT: 38.6 % (ref 36.0–46.0)
Hemoglobin: 13.4 g/dL (ref 12.0–15.0)
MCH: 30.7 pg (ref 26.0–34.0)
MCHC: 34.7 g/dL (ref 30.0–36.0)
MCV: 88.5 fL (ref 78.0–100.0)
Platelets: 215 10*3/uL (ref 150–400)
RBC: 4.36 MIL/uL (ref 3.87–5.11)
RDW: 14 % (ref 11.5–15.5)
WBC: 16.6 10*3/uL — ABNORMAL HIGH (ref 4.0–10.5)

## 2015-08-25 LAB — PROTEIN / CREATININE RATIO, URINE
Creatinine, Urine: 65 mg/dL
Protein Creatinine Ratio: 0.12 mg/mg{Cre} (ref 0.00–0.15)
Total Protein, Urine: 8 mg/dL

## 2015-08-25 NOTE — MAU Note (Signed)
Pt sent from office for elevated b/p. Denies h/a but reports  some dizziness. No abd pain . Swelling noted in hands and feet.

## 2015-08-25 NOTE — Discharge Instructions (Signed)

## 2015-08-25 NOTE — MAU Provider Note (Signed)
History     CSN: [id number]  Arrival date and time: 08/25/15 1740   First Provider Initiated Contact with Patient 08/25/15 1839      Chief Complaint  Patient presents with  . Hypertension   HPI CASSIOPEIA FLORENTINO 25 y.o. G2P1001 @ [redacted]w[redacted]d presents to the MAU from the office for serial b/p's and preeclampsia labs. Denies vag bleeding, LOF, reports positive fetal movement  Past Medical History  Diagnosis Date  . Medical history non-contributory     Past Surgical History  Procedure Laterality Date  . No past surgeries      Family History  Problem Relation Age of Onset  . Heart disease Father 38    [facility]    Social History  Substance Use Topics  . Smoking status: Never Smoker   . Smokeless tobacco: Never Used  . Alcohol Use: No     Comment: occ    Allergies: No Known Allergies  Prescriptions prior to admission  Medication Sig Dispense Refill Last Dose  . Prenatal Vit-Fe Fumarate-FA (PRENATAL MULTIVITAMIN) TABS tablet Take 1 tablet by mouth daily.    08/24/2015 at Unknown time    Review of Systems  Constitutional: Negative for fever.  Eyes: Negative for blurred vision.  Gastrointestinal: Negative for abdominal pain.  Neurological: Negative for headaches.  All other systems reviewed and are negative.  Physical Exam   Blood pressure 130/74, pulse 87, temperature 98.3 F (36.8 C), temperature source Oral, resp. rate 18, height 5' (1.524 m), weight 176 lb 3.2 oz (79.924 kg), last menstrual period 11/09/2014.  Physical Exam  Nursing note and vitals reviewed. Constitutional: She is oriented to person, place, and time. She appears well-developed and well-nourished. No distress.  HENT:  Head: Normocephalic and atraumatic.  Cardiovascular: Normal rate.   Respiratory: Effort normal. No respiratory distress.  GI: Soft. There is no tenderness.  Musculoskeletal: Normal range of motion.  Neurological: She is alert and oriented to person, place, and time. She has normal  reflexes.  Skin: Skin is warm and dry.  Psychiatric: She has a normal mood and affect. Her behavior is normal. Judgment and thought content normal.   Results for orders placed or performed during the hospital encounter of 08/25/15 (from the past 24 hour(s))  Urinalysis, Routine w reflex microscopic (not at [facility])     Status: Abnormal   Collection Time: 08/25/15  5:50 PM  Result Value Ref Range   Color, Urine YELLOW YELLOW   APPearance CLEAR CLEAR   Specific Gravity, Urine 1.010 1.005 - 1.030   pH 6.5 5.0 - 8.0   Glucose, UA NEGATIVE NEGATIVE mg/dL   Hgb urine dipstick NEGATIVE NEGATIVE   Bilirubin Urine NEGATIVE NEGATIVE   Ketones, ur NEGATIVE NEGATIVE mg/dL   Protein, ur NEGATIVE NEGATIVE mg/dL   Nitrite NEGATIVE NEGATIVE   Leukocytes, UA SMALL (A) NEGATIVE  Protein / creatinine ratio, urine     Status: None   Collection Time: 08/25/15  5:50 PM  Result Value Ref Range   Creatinine, Urine 65.00 mg/dL   Total Protein, Urine 8 mg/dL   Protein Creatinine Ratio 0.12 0.00 - 0.15 mg/mg[Cre]  Urine microscopic-add on     Status: Abnormal   Collection Time: 08/25/15  5:50 PM  Result Value Ref Range   Squamous Epithelial / LPF 6-30 (A) NONE SEEN   WBC, UA 0-5 0 - 5 WBC/hpf   RBC / HPF NONE SEEN 0 - 5 RBC/hpf   Bacteria, UA RARE (A) NONE SEEN  CBC  Status: Abnormal   Collection Time: 08/25/15  6:54 PM  Result Value Ref Range   WBC 16.6 (H) 4.0 - 10.5 K/uL   RBC 4.36 3.87 - 5.11 MIL/uL   Hemoglobin 13.4 12.0 - 15.0 g/dL   HCT 95.638.6 21.336.0 - 08.646.0 %   MCV 88.5 78.0 - 100.0 fL   MCH 30.7 26.0 - 34.0 pg   MCHC 34.7 30.0 - 36.0 g/dL   RDW 57.814.0 46.911.5 - 62.915.5 %   Platelets 215 150 - 400 K/uL  Comprehensive metabolic panel     Status: Abnormal   Collection Time: 08/25/15  6:54 PM  Result Value Ref Range   Sodium 135 135 - 145 mmol/L   Potassium 4.0 3.5 - 5.1 mmol/L   Chloride 104 101 - 111 mmol/L   CO2 20 (L) 22 - 32 mmol/L   Glucose, Bld 73 65 - 99 mg/dL   BUN 5 (L) 6 - 20 mg/dL    Creatinine, Ser 5.280.46 0.44 - 1.00 mg/dL   Calcium 9.0 8.9 - 41.310.3 mg/dL   Total Protein 6.8 6.5 - 8.1 g/dL   Albumin 3.4 (L) 3.5 - 5.0 g/dL   AST 17 15 - 41 U/L   ALT 12 (L) 14 - 54 U/L   Alkaline Phosphatase 131 (H) 38 - 126 U/L   Total Bilirubin 0.5 0.3 - 1.2 mg/dL   GFR calc non Af Amer >60 >60 mL/min   GFR calc Af Amer >60 >60 mL/min   Anion gap 11 5 - 15   08/25/15 1834  --  87  --  --  130/74 mmHg  --  --  --  -- KK     08/25/15 1819  --  94  --  --  131/76 mmHg  --  --  --  -- KK    08/25/15 1808  --  88  --  --  131/75 mmHg  --  --  --  -- KK    08/25/15 1747  98.3 F (36.8 C)  87  --  18  137/83 mmHg  --  --  --  176 lb 3.2 oz (79.924 kg) KW     MAU Course  Procedures  MDM Preeclampsia labs wnl; blood pressures wnl; FHR category 1; Dr. Dareen PianoAnderson notified  Assessment and Plan  Transient hypertension in Pregnancy  Discharge to home  [facility] HospitalClemmons,Lori Grissett 08/25/2015, 6:42 PM

## 2015-08-31 ENCOUNTER — Other Ambulatory Visit: Payer: Self-pay | Admitting: Obstetrics and Gynecology

## 2015-09-01 ENCOUNTER — Inpatient Hospital Stay (HOSPITAL_COMMUNITY): Payer: Medicaid Other | Admitting: Anesthesiology

## 2015-09-01 ENCOUNTER — Inpatient Hospital Stay (HOSPITAL_COMMUNITY)
Admission: RE | Admit: 2015-09-01 | Discharge: 2015-09-03 | DRG: 775 | Disposition: A | Discharge status: Home / Self Care | Payer: Medicaid Other | Source: Ambulatory Visit | Attending: Obstetrics and Gynecology | Admitting: Obstetrics and Gynecology

## 2015-09-01 ENCOUNTER — Encounter (HOSPITAL_COMMUNITY): Payer: Self-pay

## 2015-09-01 DIAGNOSIS — Z3A40 40 weeks gestation of pregnancy: Secondary | ICD-10-CM | POA: Diagnosis not present

## 2015-09-01 DIAGNOSIS — O480 Post-term pregnancy: Principal | ICD-10-CM | POA: Diagnosis present

## 2015-09-01 DIAGNOSIS — Z3490 Encounter for supervision of normal pregnancy, unspecified, unspecified trimester: Secondary | ICD-10-CM

## 2015-09-01 DIAGNOSIS — O26899 Other specified pregnancy related conditions, unspecified trimester: Secondary | ICD-10-CM

## 2015-09-01 DIAGNOSIS — R109 Unspecified abdominal pain: Secondary | ICD-10-CM

## 2015-09-01 LAB — CBC
HCT: 39.6 % (ref 36.0–46.0)
HCT: 38.7 % (ref 36.0–46.0)
HEMOGLOBIN: 13.5 g/dL (ref 12.0–15.0)
Hemoglobin: 13.8 g/dL (ref 12.0–15.0)
MCH: 30.5 pg (ref 26.0–34.0)
MCH: 30.8 pg (ref 26.0–34.0)
MCHC: 34.8 g/dL (ref 30.0–36.0)
MCHC: 34.9 g/dL (ref 30.0–36.0)
MCV: 87.6 fL (ref 78.0–100.0)
MCV: 88.2 fL (ref 78.0–100.0)
PLATELETS: 228 10*3/uL (ref 150–400)
PLATELETS: 216 10*3/uL (ref 150–400)
RBC: 4.52 MIL/uL (ref 3.87–5.11)
RBC: 4.39 MIL/uL (ref 3.87–5.11)
RDW: 13.8 % (ref 11.5–15.5)
RDW: 14 % (ref 11.5–15.5)
WBC: 15.9 10*3/uL — AB (ref 4.0–10.5)
WBC: 16 10*3/uL — ABNORMAL HIGH (ref 4.0–10.5)

## 2015-09-01 LAB — TYPE AND SCREEN
ABO/RH(D): B POS
ANTIBODY SCREEN: NEGATIVE

## 2015-09-01 LAB — ABO/RH: ABO/RH(D): B POS

## 2015-09-01 MED ORDER — METHYLERGONOVINE MALEATE 0.2 MG/ML IJ SOLN: 0.2000 mg | INTRAMUSCULAR | Status: DC | PRN

## 2015-09-01 MED ORDER — BENZOCAINE-MENTHOL 20-0.5 % EX AERO: 1.0000 "application " | INHALATION_SPRAY | CUTANEOUS | Status: DC | PRN

## 2015-09-01 MED ORDER — BUTORPHANOL TARTRATE 1 MG/ML IJ SOLN: 1.0000 mg | INTRAMUSCULAR | Status: DC | PRN

## 2015-09-01 MED ORDER — OXYCODONE-ACETAMINOPHEN 5-325 MG PO TABS: 1.0000 | ORAL_TABLET | ORAL | Status: DC | PRN

## 2015-09-01 MED ORDER — ONDANSETRON HCL 4 MG PO TABS: 4.0000 mg | ORAL_TABLET | ORAL | Status: DC | PRN

## 2015-09-01 MED ORDER — LIDOCAINE HCL (PF) 1 % IJ SOLN
30.0000 mL | INTRAMUSCULAR | Status: DC | PRN
  Filled 2015-09-01: qty 30

## 2015-09-01 MED ORDER — TERBUTALINE SULFATE 1 MG/ML IJ SOLN
0.2500 mg | Freq: Once | INTRAMUSCULAR | Status: DC | PRN
Start: 2015-09-01 — End: 2015-09-01
  Filled 2015-09-01: qty 1

## 2015-09-01 MED ORDER — WITCH HAZEL-GLYCERIN EX PADS: 1.0000 "application " | MEDICATED_PAD | CUTANEOUS | Status: DC | PRN

## 2015-09-01 MED ORDER — OXYTOCIN 10 UNIT/ML IJ SOLN
1.0000 m[IU]/min | INTRAVENOUS | Status: DC
  Administered 2015-09-01: 2 m[IU]/min via INTRAVENOUS

## 2015-09-01 MED ORDER — ACETAMINOPHEN 325 MG PO TABS
650.0000 mg | ORAL_TABLET | ORAL | Status: DC | PRN
Start: 2015-09-01 — End: 2015-09-01

## 2015-09-01 MED ORDER — LACTATED RINGERS IV SOLN: 500.0000 mL | INTRAVENOUS | Status: DC | PRN

## 2015-09-01 MED ORDER — PRENATAL MULTIVITAMIN CH
1.0000 | ORAL_TABLET | Freq: Every day | ORAL | Status: DC
  Administered 2015-09-02 – 2015-09-03 (×2): 1 via ORAL
  Filled 2015-09-01 (×2): qty 1

## 2015-09-01 MED ORDER — OXYCODONE-ACETAMINOPHEN 5-325 MG PO TABS: 2.0000 | ORAL_TABLET | ORAL | Status: DC | PRN

## 2015-09-01 MED ORDER — SENNOSIDES-DOCUSATE SODIUM 8.6-50 MG PO TABS
2.0000 | ORAL_TABLET | ORAL | Status: DC
  Administered 2015-09-01 – 2015-09-02 (×2): 2 via ORAL
  Filled 2015-09-01 (×2): qty 2

## 2015-09-01 MED ORDER — OXYTOCIN 10 UNIT/ML IJ SOLN
2.5000 [IU]/h | INTRAVENOUS | Status: DC
  Administered 2015-09-01: 17:00:00 via INTRAVENOUS
  Filled 2015-09-01: qty 4

## 2015-09-01 MED ORDER — LANOLIN HYDROUS EX OINT: TOPICAL_OINTMENT | CUTANEOUS | Status: DC | PRN

## 2015-09-01 MED ORDER — EPHEDRINE 5 MG/ML INJ
10.0000 mg | INTRAVENOUS | Status: DC | PRN
  Filled 2015-09-01: qty 2

## 2015-09-01 MED ORDER — IBUPROFEN 600 MG PO TABS
600.0000 mg | ORAL_TABLET | Freq: Four times a day (QID) | ORAL | Status: DC
  Administered 2015-09-01 – 2015-09-03 (×7): 600 mg via ORAL
  Filled 2015-09-01 (×7): qty 1

## 2015-09-01 MED ORDER — DIBUCAINE 1 % RE OINT: 1.0000 "application " | TOPICAL_OINTMENT | RECTAL | Status: DC | PRN

## 2015-09-01 MED ORDER — ONDANSETRON HCL 4 MG/2ML IJ SOLN: 4.0000 mg | INTRAMUSCULAR | Status: DC | PRN

## 2015-09-01 MED ORDER — LACTATED RINGERS IV SOLN
500.0000 mL | Freq: Once | INTRAVENOUS | Status: AC
  Administered 2015-09-01: 1000 mL via INTRAVENOUS

## 2015-09-01 MED ORDER — ONDANSETRON HCL 4 MG/2ML IJ SOLN
4.0000 mg | Freq: Four times a day (QID) | INTRAMUSCULAR | Status: DC | PRN
  Administered 2015-09-01: 4 mg via INTRAVENOUS
  Filled 2015-09-01: qty 2

## 2015-09-01 MED ORDER — ACETAMINOPHEN 325 MG PO TABS: 650.0000 mg | ORAL_TABLET | ORAL | Status: DC | PRN

## 2015-09-01 MED ORDER — MISOPROSTOL 25 MCG QUARTER TABLET
25.0000 ug | ORAL_TABLET | ORAL | Status: DC
  Administered 2015-09-01: 25 ug via VAGINAL
  Filled 2015-09-01 (×4): qty 1
  Filled 2015-09-01: qty 0.25
  Filled 2015-09-01: qty 1

## 2015-09-01 MED ORDER — CITRIC ACID-SODIUM CITRATE 334-500 MG/5ML PO SOLN: 30.0000 mL | ORAL | Status: DC | PRN

## 2015-09-01 MED ORDER — PHENYLEPHRINE 40 MCG/ML (10ML) SYRINGE FOR IV PUSH (FOR BLOOD PRESSURE SUPPORT)
80.0000 ug | PREFILLED_SYRINGE | INTRAVENOUS | Status: DC | PRN
  Filled 2015-09-01: qty 20
  Filled 2015-09-01: qty 2

## 2015-09-01 MED ORDER — FENTANYL 2.5 MCG/ML BUPIVACAINE 1/10 % EPIDURAL INFUSION (WH - ANES)
14.0000 mL/h | INTRAMUSCULAR | Status: DC | PRN
  Administered 2015-09-01: 14 mL/h via EPIDURAL
  Filled 2015-09-01: qty 125

## 2015-09-01 MED ORDER — OXYTOCIN BOLUS FROM INFUSION: 500.0000 mL | INTRAVENOUS | Status: DC

## 2015-09-01 MED ORDER — LIDOCAINE HCL (PF) 1 % IJ SOLN
INTRAMUSCULAR | Status: DC | PRN
  Administered 2015-09-01 (×2): 8 mL via EPIDURAL

## 2015-09-01 MED ORDER — LACTATED RINGERS IV SOLN
INTRAVENOUS | Status: DC
Start: 2015-09-01 — End: 2015-09-01
  Administered 2015-09-01 (×2): via INTRAVENOUS

## 2015-09-01 MED ORDER — SIMETHICONE 80 MG PO CHEW: 80.0000 mg | CHEWABLE_TABLET | ORAL | Status: DC | PRN

## 2015-09-01 MED ORDER — PHENYLEPHRINE 40 MCG/ML (10ML) SYRINGE FOR IV PUSH (FOR BLOOD PRESSURE SUPPORT)
80.0000 ug | PREFILLED_SYRINGE | INTRAVENOUS | Status: DC | PRN
  Filled 2015-09-01: qty 2

## 2015-09-01 MED ORDER — METHYLERGONOVINE MALEATE 0.2 MG PO TABS: 0.2000 mg | ORAL_TABLET | ORAL | Status: DC | PRN

## 2015-09-01 MED ORDER — DIPHENHYDRAMINE HCL 50 MG/ML IJ SOLN: 12.5000 mg | INTRAMUSCULAR | Status: DC | PRN

## 2015-09-01 MED ORDER — TERBUTALINE SULFATE 1 MG/ML IJ SOLN
0.2500 mg | Freq: Once | INTRAMUSCULAR | Status: DC | PRN
  Filled 2015-09-01: qty 1

## 2015-09-01 MED ORDER — FLEET ENEMA 7-19 GM/118ML RE ENEM: 1.0000 | ENEMA | RECTAL | Status: DC | PRN

## 2015-09-01 MED ORDER — DIPHENHYDRAMINE HCL 25 MG PO CAPS: 25.0000 mg | ORAL_CAPSULE | Freq: Four times a day (QID) | ORAL | Status: DC | PRN

## 2015-09-01 MED ORDER — TETANUS-DIPHTH-ACELL PERTUSSIS 5-2.5-18.5 LF-MCG/0.5 IM SUSP: 0.5000 mL | Freq: Once | INTRAMUSCULAR | Status: DC

## 2015-09-01 MED ORDER — ZOLPIDEM TARTRATE 5 MG PO TABS: 5.0000 mg | ORAL_TABLET | Freq: Every evening | ORAL | Status: DC | PRN

## 2015-09-01 NOTE — Lactation Note (Signed)
This note was copied from a baby's chart. Lactation Consultation Note Initial visit at 5 hours of age.  Mom reports several good feedings and denies pain.  Baby asleep with FOB at this time.  Mom is experienced with breastfeeding an older child for 3 months.[facility] resources given and discussed.  Encouraged to feed with early cues on demand.  Early newborn behavior discussed.  Hand expression demonstrated by mom  with colostrum visible.  Mom to call for assist as needed.     Patient Name: Kayla [name] Today's Date: 09/01/2015 Reason for consult: Initial assessment   Maternal Data Has patient been taught Hand Expression?: Yes Does the patient have breastfeeding experience prior to this delivery?: Yes  Feeding Feeding Type: Breast Fed Length of feed: 30 min  LATCH Score/Interventions Latch: Grasps breast easily, tongue down, lips flanged, rhythmical sucking.  Audible Swallowing: None Intervention(s): Skin to skin;Hand expression  Type of Nipple: Flat Intervention(s): Reverse pressure  Comfort (Breast/Nipple): Soft / non-tender     Hold (Positioning): Assistance needed to correctly position infant at breast and maintain latch. Intervention(s): Breastfeeding basics reviewed  LATCH Score: 6  Lactation Tools Discussed/Used     Consult Status Consult Status: Follow-up Date: 09/02/15 Follow-up type: In-patient    Reona Zendejas, Arvella MerlesJana Lynn 09/01/2015, 10:31 PM

## 2015-09-01 NOTE — Anesthesia Procedure Notes (Signed)
Epidural Patient location during procedure: OB Start time: 09/01/2015 9:37 AM End time: 09/01/2015 9:41 AM  Staffing Anesthesiologist: Leilani AbleHATCHETT, Jordan Caraveo Performed by: anesthesiologist   Preanesthetic Checklist Completed: patient identified, surgical consent, pre-op evaluation, timeout performed, IV checked, risks and benefits discussed and monitors and equipment checked  Epidural Patient position: sitting Prep: site prepped and draped and DuraPrep Patient monitoring: continuous pulse ox and blood pressure Approach: midline Location: L3-L4 Injection technique: LOR air  Needle:  Needle type: Tuohy  Needle gauge: 17 G Needle length: 9 cm and 9 Needle insertion depth: 6 cm Catheter type: closed end flexible Catheter size: 19 Gauge Catheter at skin depth: 11 cm Test dose: negative and Other  Assessment Sensory level: T9 Events: blood not aspirated, injection not painful, no injection resistance, negative IV test and no paresthesia  Additional Notes Reason for block:procedure for pain

## 2015-09-01 NOTE — Anesthesia Preprocedure Evaluation (Signed)
Anesthesia Evaluation  Patient identified by MRN, date of birth, ID band Patient awake    Reviewed: Allergy & Precautions, H&P , NPO status , Patient's Chart, lab work & pertinent test results  Airway Mallampati: I  TM Distance: >3 FB Neck ROM: full    Dental no notable dental hx.    Pulmonary neg pulmonary ROS,    Pulmonary exam normal breath sounds clear to auscultation       Cardiovascular negative cardio ROS Normal cardiovascular exam     Neuro/Psych negative neurological ROS  negative psych ROS   GI/Hepatic negative GI ROS, Neg liver ROS,   Endo/Other  negative endocrine ROS  Renal/GU negative Renal ROS     Musculoskeletal   Abdominal (+) + obese,   Peds  Hematology negative hematology ROS (+)   Anesthesia Other Findings   Reproductive/Obstetrics (+) Pregnancy                             Anesthesia Physical Anesthesia Plan  ASA: II  Anesthesia Plan: Epidural   Post-op Pain Management:    Induction:   Airway Management Planned:   Additional Equipment:   Intra-op Plan:   Post-operative Plan:   Informed Consent: I have reviewed the patients History and Physical, chart, labs and discussed the procedure including the risks, benefits and alternatives for the proposed anesthesia with the patient or authorized representative who has indicated his/her understanding and acceptance.     Plan Discussed with:   Anesthesia Plan Comments:         Anesthesia Quick Evaluation  

## 2015-09-01 NOTE — H&P (Signed)
Kayla [name] is a 25 y.o. female presenting for PD IOL  25 yo G2P1001 @ 40+2 admitted for a PD IOL. In the office yesterday she was noted to have some elevated BPs and was admitted for IOL. Her pregnancy has been uncomplicted to this point. History OB History    Gravida Para Term Preterm AB TAB SAB Ectopic Multiple Living   2 1 1       1      Past Medical History  Diagnosis Date  . Medical history non-contributory    Past Surgical History  Procedure Laterality Date  . No past surgeries     Family History: family history includes Heart disease (age of onset: 1644) in her father. Social History:  reports that she has never smoked. She has never used smokeless tobacco. She reports that she does not drink alcohol or use illicit drugs.   Prenatal Transfer Tool  Maternal Diabetes: No Genetic Screening: Normal Maternal Ultrasounds/Referrals: Normal Fetal Ultrasounds or other Referrals:  None Maternal Substance Abuse:  No Significant Maternal Medications:  None Significant Maternal Lab Results:  None Other Comments:  None  ROS  Dilation: 5.5 Effacement (%): 90 Station: 0 Exam by:: Dr. Tenny Crawoss Blood pressure 119/56, pulse 89, temperature 98.4 F (36.9 C), temperature source Oral, resp. rate 18, height 5' (1.524 m), weight 80.377 kg (177 lb 3.2 oz), last menstrual period 11/09/2014, SpO2 99 %. Exam Physical Exam  Prenatal labs: ABO, Rh: --/--/B POS, B POS (04/06 0125) Antibody: NEG (04/06 0125) Rubella: Immune (09/21 0000) RPR: Nonreactive (09/21 0000)  HBsAg: Negative (09/21 0000)  HIV: Non-reactive (09/21 0000)  GBS: Negative (03/08 0000)   Assessment/Plan: 1) Admit 2) Epidural on request   Kayla [name] H. 09/01/2015, 12:28 PM

## 2015-09-01 NOTE — Progress Notes (Signed)
Patient ID: Kayla [name], female   DOB: 01-Mar-1991, 25 y.o.   MRN: [id number]   S: Pt comfortable  O:  Filed Vitals:   09/01/15 0108 09/01/15 0636 09/01/15 0637 09/01/15 0739  BP:   145/87 147/74  Pulse:   91 83  Temp: 98.3 F (36.8 C) 98.2 F (36.8 C)  98.1 F (36.7 C)  TempSrc: Oral Oral  Oral  Resp:   18 18  Height: 5' (1.524 m)     Weight: 80.377 kg (177 lb 3.2 oz)      AOX3, NAD Cvx 4/70/-2 FHR 135 reactive, category 1 tracing toco irregular, Q 2  AROM light meconium  A/P 1) Will start pit  2) FWB reassuring 3) Epidural on request

## 2015-09-02 LAB — CBC
HCT: 34.2 % — ABNORMAL LOW (ref 36.0–46.0)
HEMOGLOBIN: 12.1 g/dL (ref 12.0–15.0)
MCH: 31.1 pg (ref 26.0–34.0)
MCHC: 35.4 g/dL (ref 30.0–36.0)
MCV: 87.9 fL (ref 78.0–100.0)
PLATELETS: 211 10*3/uL (ref 150–400)
RBC: 3.89 MIL/uL (ref 3.87–5.11)
RDW: 14.2 % (ref 11.5–15.5)
WBC: 26.1 10*3/uL — ABNORMAL HIGH (ref 4.0–10.5)

## 2015-09-02 LAB — RPR: RPR Ser Ql: NONREACTIVE

## 2015-09-02 NOTE — Progress Notes (Signed)
PPD#1 Pt wiithout c/o. Lochia-wnl VSSAF IMP/ stable Plan/ Routine care

## 2015-09-02 NOTE — Lactation Note (Signed)
This note was copied from a baby's chart. Lactation Consultation Note  Patient Name: Kayla [name] Reason for consult: Follow-up assessment Baby at 24 hr of life and mom is reporting bilateral nipple soreness. Her nipples do appear red. Given comfort gels. Demonstrated manual expression, colostrum noted bilaterally, spoon in room. Mom is worried that baby is not getting enough. She has started formula with a slow flow bottle nipple. Baby was sleeping on pillows in mom's bed, discussed safe sleep. Attempted an oral assessment on baby. Palate was high, baby was doing a lot of biting down, no real tongue movement noted. Encouraged mom to call RN or LC at next feeding for latch help.    Maternal Data    Feeding Feeding Type: Breast Fed  LATCH Score/Interventions                      Lactation Tools Discussed/Used     Consult Status Consult Status: Follow-up Date: 09/03/15 Follow-up type: In-patient    Kayla [name] [name], 6:07 PM

## 2015-09-02 NOTE — Anesthesia Postprocedure Evaluation (Signed)
Anesthesia Post Note  Patient: Kayla [name] L Miller  Procedure(s) Performed: * No procedures listed *  Patient location during evaluation: Mother Baby Anesthesia Type: Epidural Level of consciousness: awake, awake and alert, oriented and patient cooperative Pain management: pain level controlled Vital Signs Assessment: post-procedure vital signs reviewed and stable Respiratory status: spontaneous breathing, nonlabored ventilation and respiratory function stable Cardiovascular status: stable Postop Assessment: no headache, no backache, patient able to bend at knees and no signs of nausea or vomiting Anesthetic complications: no    Last Vitals:  Filed Vitals:   09/02/15 0015 09/02/15 0827  BP: 123/72 135/67  Pulse: 94 79  Temp: 36.8 C 36.8 C  Resp: 18 18    Last Pain:  Filed Vitals:   09/02/15 0832  PainSc: 0-No pain                 [name]

## 2015-09-02 NOTE — Progress Notes (Signed)
UR chart review completed.  

## 2015-09-03 ENCOUNTER — Ambulatory Visit: Payer: Self-pay

## 2015-09-03 NOTE — Progress Notes (Signed)
PPD#2 Pt without complaints. Lochia-mild, baby under bili lights VSSAF IMP/ Stable Plan/ Discharge

## 2015-09-03 NOTE — Discharge Summary (Signed)
Obstetric Discharge Summary Reason for Admission: induction of labor Prenatal Procedures: NST and ultrasound Intrapartum Procedures: spontaneous vaginal delivery Postpartum Procedures: none Complications-Operative and Postpartum: none HEMOGLOBIN  Date Value Ref Range Status  09/02/2015 12.1 12.0 - 15.0 g/dL Final   HCT  Date Value Ref Range Status  09/02/2015 34.2* 36.0 - 46.0 % Final    Physical Exam:  General: alert Lochia: appropriate Uterine Fundus: firm  Discharge Diagnoses: Post-date pregnancy and shoulder dystocia  Discharge Information: Date: 09/03/2015 Activity: pelvic rest Diet: routine Medications: PNV and Ibuprofen Condition: stable Instructions: refer to practice specific booklet Discharge to: home Follow-up Information    Follow up with Almon HerculesOSS,KENDRA H., MD. Schedule an appointment as soon as possible for a visit in 1 month.   Specialty:  Obstetrics and Gynecology   Contact information:   [address] [place] [address] [phone]       Newborn Data: Live born female  Birth Weight: 7 lb 5.6 oz (3335 g) APGAR: 7, 9  Still in hospital under bili lights.  Bobbye Reinitz E 09/03/2015, 8:06 AM

## 2015-09-03 NOTE — Lactation Note (Signed)
This note was copied from a baby's chart. Lactation Consultation Note  Patient Name: Kayla [name] ZOXWR'UToday's Date: 09/03/2015 Reason for consult: Follow-up assessment Baby at 49 hr of life and mom reports bf is going well. She offering breast one feeding then formula the next. Her nipple pain has improved. She voiced no concerns. She is aware of lactation services and will call as needed.   Maternal Data    Feeding Feeding Type: Formula  LATCH Score/Interventions                      Lactation Tools Discussed/Used     Consult Status Consult Status: PRN    Rulon Eisenmengerlizabeth E Scottie Stanish 09/03/2015, 6:42 PM

## 2015-09-05 ENCOUNTER — Ambulatory Visit: Payer: Self-pay

## 2015-09-05 NOTE — Lactation Note (Signed)
This note was copied from a baby's chart. Lactation Consultation Note  Patient Name: Kayla [name] ZOXWR'UToday's Date: 09/05/2015 Reason for consult: Follow-up assessment  With this mom and term baby, now 9887 hours old. Mom is breast feeding and pumping with hand pump, then bottle EBM. She ws able to express 60 ml's this morning, which baby drank and tolerated, and mom has anoht 2 oz she pumped since, at her bedside. The baby 's bili level was down this morning. Mom knows to call for questions/concerns. Baby asleep with mom at this time.   Maternal Data    Feeding    LATCH Score/Interventions                      Lactation Tools Discussed/Used     Consult Status Consult Status: Complete Follow-up type: Call as needed    Alfred LevinsLee, Germaine Ripp Anne 09/05/2015, 8:38 AM

## 2016-07-13 ENCOUNTER — Ambulatory Visit (HOSPITAL_COMMUNITY)
Admission: EM | Admit: 2016-07-13 | Discharge: 2016-07-13 | Disposition: A | Discharge status: Home / Self Care | Payer: Medicaid Other | Attending: Family Medicine | Admitting: Family Medicine

## 2016-07-13 ENCOUNTER — Encounter (HOSPITAL_COMMUNITY): Payer: Self-pay | Admitting: *Deleted

## 2016-07-13 DIAGNOSIS — H66001 Acute suppurative otitis media without spontaneous rupture of ear drum, right ear: Secondary | ICD-10-CM | POA: Diagnosis not present

## 2016-07-13 MED ORDER — AMOXICILLIN-POT CLAVULANATE 875-125 MG PO TABS: 1.0000 | ORAL_TABLET | Freq: Two times a day (BID) | ORAL | 0 refills | Status: DC

## 2016-07-13 NOTE — Discharge Instructions (Signed)
You have an ear infection in your right ear. I have prescribed Augmentin, take one tablet twice a day for 7 days. I also recommend that you take Flonase nasal spray, 2 sprays in each nare once a day. I recommend Tylenol every 4 hours for pain, not to exceed 4000 mg in any 24-hour period, he may also take ibuprofen or naproxen as well if you prefer. She just symptoms fail to resolve recommend he follow up with her primary care provider within one week or return to clinic as needed.

## 2016-07-13 NOTE — ED Triage Notes (Signed)
Pt  Has  r  Earache   And  sorethroat   Since  Last   Pm            She   Did  Not  Get a  Flu  Shot  This  Year     She  Is  Awake  And  Alert and  Oriented

## 2016-07-13 NOTE — ED Provider Notes (Signed)
CSN: [id number]     Arrival date & time 07/13/16  1709 History   First MD Initiated Contact with Patient 07/13/16 1750     Chief Complaint  Patient presents with  . Otalgia   (Consider location/radiation/quality/duration/timing/severity/associated sxs/prior Treatment) 26 year old female presents to clinic with a three-day history of right ear pain. She states she has pain whenever she is leaning on her right side, has a sensation of pressure within her ear, as well as ringing. She denies any dizziness, she denies fever, she did state she has had congestion and URI type symptoms 1 week ago.   The history is provided by the patient.  Otalgia    Past Medical History:  Diagnosis Date  . Medical history non-contributory    Past Surgical History:  Procedure Laterality Date  . NO PAST SURGERIES     Family History  Problem Relation Age of Onset  . Heart disease Father 2844    AMI   Social History  Substance Use Topics  . Smoking status: Never Smoker  . Smokeless tobacco: Never Used  . Alcohol use No     Comment: occ   OB History    Gravida Para Term Preterm AB Living   2 2 2     1    SAB TAB Ectopic Multiple Live Births         0 1     Review of Systems  Reason unable to perform ROS: as covered in HPI.  HENT: Positive for ear pain.   All other systems reviewed and are negative.   Allergies  Patient has no known allergies.  Home Medications   Prior to Admission medications   Medication Sig Start Date End Date Taking? Authorizing Provider  amoxicillin-clavulanate (AUGMENTIN) 875-125 MG tablet Take 1 tablet by mouth 2 (two) times daily. 07/13/16   Dorena BodoLawrence Gareth Fitzner, NP  Prenatal Vit-Fe Fumarate-FA (PRENATAL MULTIVITAMIN) TABS tablet Take 1 tablet by mouth daily.     Historical Provider, MD   Meds Ordered and Administered this Visit  Medications - No data to display  BP 134/82 (BP Location: Right Arm)   Pulse 94   Temp 97.5 F (36.4 C) (Oral)   Resp 18   LMP  06/29/2016   SpO2 99%  No data found.   Physical Exam  Constitutional: She is oriented to person, place, and time. She appears well-developed and well-nourished. She does not have a sickly appearance. She does not appear ill. No distress.  HENT:  Head: Normocephalic and atraumatic.  Right Ear: External ear normal. There is swelling. Tympanic membrane is injected. A middle ear effusion is present.  Left Ear: Tympanic membrane and external ear normal.  Nose: Nose normal. Right sinus exhibits no maxillary sinus tenderness and no frontal sinus tenderness. Left sinus exhibits no maxillary sinus tenderness and no frontal sinus tenderness.  Mouth/Throat: Uvula is midline, oropharynx is clear and moist and mucous membranes are normal. No oropharyngeal exudate.  Eyes: Pupils are equal, round, and reactive to light.  Neck: Normal range of motion. Neck supple. No JVD present.  Cardiovascular: Normal rate and regular rhythm.   Pulmonary/Chest: Effort normal and breath sounds normal. No respiratory distress. She has no wheezes.  Abdominal: Soft. Bowel sounds are normal. She exhibits no distension. There is no tenderness. There is no guarding.  Lymphadenopathy:    She has no cervical adenopathy.  Neurological: She is alert and oriented to person, place, and time.  Skin: Skin is warm and dry. Capillary refill takes less  than 2 seconds. She is not diaphoretic.  Psychiatric: She has a normal mood and affect.  Nursing note and vitals reviewed.   Urgent Care Course     Procedures (including critical care time)  Labs Review Labs Reviewed - No data to display  Imaging Review No results found.   Visual Acuity Review  Right Eye Distance:   Left Eye Distance:   Bilateral Distance:    Right Eye Near:   Left Eye Near:    Bilateral Near:         MDM   1. Acute suppurative otitis media of right ear without spontaneous rupture of tympanic membrane, recurrence not specified   You have an ear  infection in your right ear. I have prescribed Augmentin, take one tablet twice a day for 7 days. I also recommend that you take Flonase nasal spray, 2 sprays in each nare once a day. I recommend Tylenol every 4 hours for pain, not to exceed 4000 mg in any 24-hour period, he may also take ibuprofen or naproxen as well if you prefer. She just symptoms fail to resolve recommend he follow up with her primary care provider within one week or return to clinic as needed.    Dorena Bodo, NP 07/13/16 [phone]

## 2016-10-05 ENCOUNTER — Ambulatory Visit (INDEPENDENT_AMBULATORY_CARE_PROVIDER_SITE_OTHER): Payer: BLUE CROSS/BLUE SHIELD

## 2016-10-05 ENCOUNTER — Encounter (HOSPITAL_COMMUNITY): Payer: Self-pay | Admitting: Emergency Medicine

## 2016-10-05 ENCOUNTER — Ambulatory Visit (HOSPITAL_COMMUNITY)
Admission: EM | Admit: 2016-10-05 | Discharge: 2016-10-05 | Disposition: A | Discharge status: Home / Self Care | Payer: BLUE CROSS/BLUE SHIELD | Attending: Internal Medicine | Admitting: Internal Medicine

## 2016-10-05 DIAGNOSIS — M25461 Effusion, right knee: Secondary | ICD-10-CM

## 2016-10-05 DIAGNOSIS — M25561 Pain in right knee: Secondary | ICD-10-CM | POA: Diagnosis not present

## 2016-10-05 DIAGNOSIS — M545 Low back pain: Secondary | ICD-10-CM

## 2016-10-05 MED ORDER — DICLOFENAC SODIUM 75 MG PO TBEC: 75.0000 mg | DELAYED_RELEASE_TABLET | Freq: Two times a day (BID) | ORAL | 0 refills | Status: DC

## 2016-10-05 MED ORDER — CYCLOBENZAPRINE HCL 10 MG PO TABS: 10.0000 mg | ORAL_TABLET | Freq: Two times a day (BID) | ORAL | 0 refills | Status: DC | PRN
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
## 2016-10-05 NOTE — Discharge Instructions (Signed)
You have a small effusion in your right knee, that was not appreciated on physical exam. We've placed your knee in a knee sleeve, I prescribed 2 medicines for your pain. The first is diclofenac, take one tablet twice a day, the second is Flexeril also one tablet twice a day. Flexeril may cause drowsiness, do not drink, and do not drive and to know how this medicine affects you. If her pain persists past one week, return to clinic, or follow-up with a orthopedist.

## 2016-11-21 ENCOUNTER — Ambulatory Visit: Payer: BLUE CROSS/BLUE SHIELD | Admitting: Family Medicine

## 2016-12-07 ENCOUNTER — Ambulatory Visit (INDEPENDENT_AMBULATORY_CARE_PROVIDER_SITE_OTHER): Payer: BLUE CROSS/BLUE SHIELD | Admitting: Family Medicine

## 2016-12-07 ENCOUNTER — Encounter: Payer: Self-pay | Admitting: Family Medicine

## 2016-12-07 VITALS — BP 120/80 | HR 86 | Resp 12 | Ht 60.0 in | Wt 165.2 lb

## 2016-12-07 DIAGNOSIS — Z8249 Family history of ischemic heart disease and other diseases of the circulatory system: Secondary | ICD-10-CM | POA: Diagnosis not present

## 2016-12-07 DIAGNOSIS — Z6832 Body mass index (BMI) 32.0-32.9, adult: Secondary | ICD-10-CM

## 2016-12-07 DIAGNOSIS — H6981 Other specified disorders of Eustachian tube, right ear: Secondary | ICD-10-CM | POA: Diagnosis not present

## 2016-12-07 DIAGNOSIS — E669 Obesity, unspecified: Secondary | ICD-10-CM | POA: Insufficient documentation

## 2016-12-07 NOTE — Patient Instructions (Signed)
A few things to remember from today's visit:   Family history of early CAD  Class 1 obesity with serious comorbidity and body mass index (BMI) of 32.0 to 32.9 in adult, unspecified obesity type  Prevention of diabetes, blood pressure and other cardiovascular conditions.  Heart Disease Prevention Heart disease is a leading cause of death. There are many things you can do to help prevent heart disease. Be physically active Physical activity is good for your heart. It helps control your blood pressure, cholesterol levels, and weight. Try to be physically active every day. Ask your health care provider what activities are best for you. Be a healthy weight Extra weight can strain your heart and affect your blood pressure and cholesterol levels. Lose weight with diet and exercise if recommended by your health care provider. Eat heart-healthy foods Follow a healthy eating plan as recommended by your health care provider or dietitian. Heart-healthy foods include:  High-fiber foods. These include oat bran, oatmeal, and whole-grain breads and cereals.  Fruits and vegetables.  Avoid:  Alcohol.  Fried foods.  Foods high in saturated fat. These include meats, butter, whole dairy products, shortening, and coconut or palm oil.  Salty foods. These include canned food, luncheon meat, salty snacks, and fast food.  Keep your cholesterol levels under control Cholesterol is a substance that is used for many important functions. When your cholesterol levels are high, cholesterol can stick to the insides of your blood vessels, making them narrow or clog. This can lead to chest pain (angina) and a heart attack. Keep your cholesterol levels under control as recommended by your health care provider. Have your cholesterol checked at least once a year. Target cholesterol levels (in mg/dL) for most people are:  Total cholesterol below 200.  LDL cholesterol below 100.  HDL cholesterol above 40 in men and  above 50 in women.  Triglycerides below 150.  Keep your blood pressure under control Having high blood pressure (hypertension) puts you at risk for stroke and other forms of heart disease. Keep your blood pressure under control as recommended by your health care provider. Ask your health care provider if you need treatment to lower your blood pressure. If you are 2118-25 years of age, have your blood pressure checked every 3-5 years. If you are 25 years of age or older, have your blood pressure checked every year. Do not use tobacco products Tobacco smoke can damage your heart and blood vessels. Do not use any tobacco products including cigarettes, chewing tobacco, or electronic cigarettes. If you need help quitting, ask your health care provider. Take medicines as directed Take medicines only as directed by your health care provider. Ask your health care provider whether you should take an aspirin every day. Taking aspirin can help reduce your risk of heart disease and stroke. Where to find more information: To find out more about heart disease, visit the American Heart Association's website at www.americanheart.org This information is not intended to replace advice given to you by your health care provider. Make sure you discuss any questions you have with your health care provider. Document Released: 12/27/2003 Document Revised: 10/12/2015 Document Reviewed: 07/08/2013 Elsevier Interactive Patient Education  2017 Elsevier Inc.   Please be sure medication list is accurate. If a new problem present, please set up appointment sooner than planned today.

## 2016-12-07 NOTE — Progress Notes (Signed)
HPI:   Kayla [name] is a 26 y.o. female, who is here today to establish care with me.  Former PCP: N/A Last preventive routine visit: 2017, gyn exam.She ahs an appt with her Gyn, Dr Tenny Craw  Chronic medical problems: Abnormal glucose during pregnancy, pregnancy-induced HT and recurrent UTI. FHx of premature CAD, father at age 27 died from a MI. She does not smoke and no Hx of HTN or HLD. She has not followed a healthy diet or regular exercise consistently.   Concerns today:   Right ear fullness sensation and "pop" sensation with swallowing. She denies recent travel or flying. No Hx of allergies. Denies fever,chills, myalgias,nasal congestion, sore throat, or sinus pressure. No sick contact. She has not tried OTC treatments.   Review of Systems  Constitutional: Negative for activity change, appetite change, fatigue and fever.  HENT: Negative for congestion, ear discharge, ear pain, hearing loss, mouth sores, postnasal drip, sinus pressure, sore throat, trouble swallowing and voice change.   Eyes: Negative for redness and visual disturbance.  Respiratory: Negative for cough, shortness of breath and wheezing.   Cardiovascular: Negative for palpitations and leg swelling.  Gastrointestinal: Negative for abdominal pain, nausea and vomiting.       No changes in bowel habits.  Endocrine: Negative for polydipsia, polyphagia and polyuria.  Genitourinary: Negative for decreased urine volume, dysuria and hematuria.  Musculoskeletal: Negative for gait problem, myalgias and neck pain.  Skin: Negative for rash.  Allergic/Immunologic: Negative for environmental allergies.  Neurological: Negative for dizziness, weakness and headaches.  Hematological: Negative for adenopathy. Does not bruise/bleed easily.  Psychiatric/Behavioral: Negative for confusion. The patient is not nervous/anxious.      No current outpatient prescriptions on file prior to visit.   No current  facility-administered medications on file prior to visit.     Past Medical History:  Diagnosis Date  . History of frequent urinary tract infections   . Medical history non-contributory    Allergies  Allergen Reactions  . Penicillins Itching    Family History  Problem Relation Age of Onset  . Heart disease Father 68       AMI  . Cancer Neg Hx   . Diabetes Neg Hx   . Stroke Neg Hx     Social History   Social History  . Marital status: Single    Spouse name: N/A  . Number of children: N/A  . Years of education: N/A   Social History Main Topics  . Smoking status: Never Smoker  . Smokeless tobacco: Never Used  . Alcohol use No     Comment: occ  . Drug use: No  . Sexual activity: Yes    Partners: Male    Birth control/ protection: Pill   Other Topics Concern  . None   Social History Narrative   Marital status: single; not dating.     Children: 1 son (3yo)      Lives: with mom      Employment: Research officer, political party; moderately happy.      Education: [employer] off [employer].      Tobacco: none      Alcohol: none      Drugs: none      Exercise:  None; active.          Vitals:   12/07/16 1156  BP: 120/80  Pulse: 86  Resp: 12  O2 sat at RA 99%  Body mass index is 32.27 kg/m.  Physical Exam  Nursing note and vitals reviewed. Constitutional: She is oriented to person, place, and time. She appears well-developed. She does not appear ill. No distress.  HENT:  Head: Atraumatic.  Right Ear: External ear and ear canal normal. A middle ear effusion is present.  Left Ear: Tympanic membrane, external ear and ear canal normal.  Mouth/Throat: Oropharynx is clear and moist and mucous membranes are normal.  Eyes: Pupils are equal, round, and reactive to light. Conjunctivae and EOM are normal.  Neck: No tracheal deviation present. No thyroid mass and no thyromegaly present.  Cardiovascular: Normal rate and regular rhythm.   No murmur  heard. Respiratory: Effort normal and breath sounds normal. No respiratory distress.  GI: Soft. She exhibits no mass. There is no hepatomegaly. There is no tenderness.  Musculoskeletal: She exhibits no edema or tenderness.  Lymphadenopathy:    She has no cervical adenopathy.  Neurological: She is alert and oriented to person, place, and time. She has normal strength. Gait normal.  Skin: Skin is warm. No rash noted. No erythema.  Psychiatric: She has a normal mood and affect. Her speech is normal.  Well groomed, good eye contact.     ASSESSMENT AND PLAN:   Kayla [name] was seen today for establish care.  Diagnoses and all orders for this visit:  Eustachian tube dysfunction, right  Educated about Dx. Treatment options: OTC decongestants, some side effects discussed. Also autoinflation maneuvers may help. Instructed about warning signs. F/U as needed.  Family history of early CAD  We discussed modifiable and no modifiable CV risk factors. Prevention of HLD,DM,and HTN as well as maintaining a healthy wt strongly recommended. She is on OCP.  Class 1 obesity with serious comorbidity and body mass index (BMI) of 32.0 to 32.9 in adult, unspecified obesity type  We discussed benefits of wt loss as well as adverse effects of obesity. Consistency with healthy diet and physical activity recommended. Daily brisk walking for 15-30 min as tolerated is a good option.   She states that usually her gyn screens for diabetes.     Katiya Fike G. Swazil[place], MD  [facility]. [place] office.

## 2016-12-10 ENCOUNTER — Ambulatory Visit (HOSPITAL_COMMUNITY)
Admission: EM | Admit: 2016-12-10 | Discharge: 2016-12-10 | Disposition: A | Discharge status: Home / Self Care | Payer: BLUE CROSS/BLUE SHIELD | Attending: Family Medicine | Admitting: Family Medicine

## 2016-12-10 ENCOUNTER — Encounter (HOSPITAL_COMMUNITY): Payer: Self-pay | Admitting: Emergency Medicine

## 2016-12-10 DIAGNOSIS — J029 Acute pharyngitis, unspecified: Secondary | ICD-10-CM | POA: Diagnosis present

## 2016-12-10 DIAGNOSIS — R599 Enlarged lymph nodes, unspecified: Secondary | ICD-10-CM | POA: Diagnosis not present

## 2016-12-10 DIAGNOSIS — R509 Fever, unspecified: Secondary | ICD-10-CM

## 2016-12-10 LAB — POCT RAPID STREP A: Streptococcus, Group A Screen (Direct): NEGATIVE

## 2016-12-10 LAB — POCT INFECTIOUS MONO SCREEN: Mono Screen: NEGATIVE

## 2016-12-10 MED ORDER — MAGIC MOUTHWASH W/LIDOCAINE: 5.0000 mL | Freq: Three times a day (TID) | ORAL | 0 refills | Status: DC | PRN

## 2016-12-10 MED ORDER — DEXAMETHASONE SODIUM PHOSPHATE 10 MG/ML IJ SOLN
INTRAMUSCULAR | Status: AC
  Filled 2016-12-10: qty 1

## 2016-12-10 MED ORDER — DEXAMETHASONE SODIUM PHOSPHATE 10 MG/ML IJ SOLN
10.0000 mg | Freq: Once | INTRAMUSCULAR | Status: AC
  Administered 2016-12-10: 10 mg via INTRAMUSCULAR

## 2016-12-10 NOTE — ED Triage Notes (Signed)
[facility] a complaint of a sore throat x 5 days. The patient denied any fever at home.

## 2016-12-10 NOTE — Discharge Instructions (Signed)
Your strep test and mono test screenings were both negative, I don't see any exudate on her tonsils, this is most consistent with a viral infection. Antibiotics typically do not help. For your symptoms were given injection of dexamethasone, and a prescription for Magic mouthwash with lidocaine, swish and swallow 3 times a day as needed. If your symptoms worsen, return to clinic, if they do not resolve follow up with her primary care provider in one to 2 weeks as needed.

## 2016-12-10 NOTE — ED Provider Notes (Signed)
CSN: [id number]     Arrival date & time 12/10/16  1023 History   First MD Initiated Contact with Patient 12/10/16 1127     Chief Complaint  Patient presents with  . Sore Throat   (Consider location/radiation/quality/duration/timing/severity/associated sxs/prior Treatment) 26 year old female presents to clinic with 5 day history of sore throat, painful swallowing, swollen lymph nodes, and change of voice. Denies history of cough, no nausea, vomiting, diarrhea, abdominal pain. No congestion and runny nose, no ear pain or ear pressure. Denies any other complaints, no recent travel, history is otherwise negative   The history is provided by the patient.    Past Medical History:  Diagnosis Date  . History of frequent urinary tract infections   . Medical history non-contributory    Past Surgical History:  Procedure Laterality Date  . NO PAST SURGERIES     Family History  Problem Relation Age of Onset  . Heart disease Father 70       AMI  . Cancer Neg Hx   . Diabetes Neg Hx   . Stroke Neg Hx    Social History  Substance Use Topics  . Smoking status: Never Smoker  . Smokeless tobacco: Never Used  . Alcohol use No     Comment: occ   OB History    Gravida Para Term Preterm AB Living   2 2 2     1    SAB TAB Ectopic Multiple Live Births         0 1     Review of Systems  Constitutional: Positive for chills and fever.  HENT: Positive for sore throat and voice change. Negative for congestion, rhinorrhea, sinus pain and sinus pressure.   Respiratory: Negative.   Cardiovascular: Negative.   Gastrointestinal: Negative.   Musculoskeletal: Negative.   Skin: Negative.   Neurological: Negative.     Allergies  Penicillins  Home Medications   Prior to Admission medications   Medication Sig Start Date End Date Taking? Authorizing Provider  magic mouthwash w/lidocaine SOLN Take 5 mLs by mouth 3 (three) times daily as needed for mouth pain. 12/10/16   Dorena Bodo, NP    Meds Ordered and Administered this Visit   Medications  dexamethasone (DECADRON) injection 10 mg (not administered)    BP 131/76 (BP Location: Right Arm)   Pulse 79   Temp 99.1 F (37.3 C) (Oral)   Resp 18   LMP 11/20/2016   SpO2 100%  No data found.   Physical Exam  Constitutional: She is oriented to person, place, and time. She appears well-developed and well-nourished. No distress.  HENT:  Head: Normocephalic and atraumatic.  Right Ear: External ear normal.  Left Ear: External ear normal.  Mouth/Throat: Uvula is midline. Posterior oropharyngeal erythema present. No posterior oropharyngeal edema or tonsillar abscesses. Tonsils are 2+ on the right. Tonsils are 2+ on the left. No tonsillar exudate.  Eyes: Conjunctivae are normal.  Neck: Normal range of motion.  Cardiovascular: Normal rate and regular rhythm.   Pulmonary/Chest: Effort normal and breath sounds normal.  Abdominal: Soft. Bowel sounds are normal. There is no tenderness.  Lymphadenopathy:    She has cervical adenopathy.  Neurological: She is alert and oriented to person, place, and time.  Skin: Skin is warm and dry. Capillary refill takes less than 2 seconds. No rash noted. She is not diaphoretic. No erythema.  Psychiatric: She has a normal mood and affect. Her behavior is normal.  Nursing note and vitals reviewed.   Urgent Care  Course     Procedures (including critical care time)  Labs Review Labs Reviewed  POCT RAPID STREP A  POCT INFECTIOUS MONO SCREEN    Imaging Review No results found.     MDM   1. Viral pharyngitis       Centor Criteria   no :Exudative Tonsillitis  no :Presence of Cough yes :Hx of Fever yes :Tender cervical lymph adenopathy no :Age less than 14 (+1) or over 44 (-1)  Total 3/4, RSS (-), Mono POCT Screening (-), Most likely viral in nature, given injection of dexamethasone, Rx magic Mouthwash, follow up with PCP as needed.     Dorena BodoKennard, Jceon Alverio, NP 12/10/16  1205

## 2016-12-12 LAB — CULTURE, GROUP A STREP (THRC)

## 2017-03-26 ENCOUNTER — Encounter (HOSPITAL_COMMUNITY): Payer: Self-pay | Admitting: Emergency Medicine

## 2017-03-26 ENCOUNTER — Emergency Department (HOSPITAL_COMMUNITY)
Admission: EM | Admit: 2017-03-26 | Discharge: 2017-03-26 | Disposition: A | Discharge status: Home / Self Care | Payer: BLUE CROSS/BLUE SHIELD | Attending: Emergency Medicine | Admitting: Emergency Medicine

## 2017-03-26 ENCOUNTER — Emergency Department (HOSPITAL_COMMUNITY): Payer: BLUE CROSS/BLUE SHIELD

## 2017-03-26 DIAGNOSIS — R109 Unspecified abdominal pain: Secondary | ICD-10-CM | POA: Diagnosis not present

## 2017-03-26 DIAGNOSIS — K644 Residual hemorrhoidal skin tags: Secondary | ICD-10-CM | POA: Diagnosis not present

## 2017-03-26 DIAGNOSIS — K649 Unspecified hemorrhoids: Secondary | ICD-10-CM

## 2017-03-26 LAB — CBC
HCT: 38.8 % (ref 36.0–46.0)
Hemoglobin: 12.5 g/dL (ref 12.0–15.0)
MCH: 28.2 pg (ref 26.0–34.0)
MCHC: 32.2 g/dL (ref 30.0–36.0)
MCV: 87.4 fL (ref 78.0–100.0)
Platelets: 284 10*3/uL (ref 150–400)
RBC: 4.44 MIL/uL (ref 3.87–5.11)
RDW: 13.1 % (ref 11.5–15.5)
WBC: 11.9 10*3/uL — AB (ref 4.0–10.5)

## 2017-03-26 LAB — URINALYSIS, ROUTINE W REFLEX MICROSCOPIC
Bilirubin Urine: NEGATIVE
Glucose, UA: NEGATIVE mg/dL
Hgb urine dipstick: NEGATIVE
KETONES UR: NEGATIVE mg/dL
LEUKOCYTES UA: NEGATIVE
Nitrite: NEGATIVE
Protein, ur: NEGATIVE mg/dL
Specific Gravity, Urine: 1.024 (ref 1.005–1.030)
pH: 6 (ref 5.0–8.0)

## 2017-03-26 LAB — COMPREHENSIVE METABOLIC PANEL
ALBUMIN: 3.8 g/dL (ref 3.5–5.0)
ALK PHOS: 60 U/L (ref 38–126)
ALT: 12 U/L — AB (ref 14–54)
ANION GAP: 8 (ref 5–15)
AST: 19 U/L (ref 15–41)
BUN: 10 mg/dL (ref 6–20)
CO2: 23 mmol/L (ref 22–32)
CREATININE: 0.85 mg/dL (ref 0.44–1.00)
Calcium: 9 mg/dL (ref 8.9–10.3)
Chloride: 105 mmol/L (ref 101–111)
GFR calc Af Amer: >60 mL/min (ref >60)
GFR calc non Af Amer: >60 mL/min (ref >60)
GLUCOSE: 138 mg/dL — AB (ref 65–99)
Potassium: 3.8 mmol/L (ref 3.5–5.1)
SODIUM: 136 mmol/L (ref 135–145)
Total Bilirubin: 0.5 mg/dL (ref 0.3–1.2)
Total Protein: 6.5 g/dL (ref 6.5–8.1)

## 2017-03-26 LAB — LIPASE, BLOOD: Lipase: 24 U/L (ref 11–51)

## 2017-03-26 LAB — HCG, QUANTITATIVE, PREGNANCY: hCG, Beta Chain, Quant, S: <1 m[IU]/mL (ref <5)

## 2017-03-26 MED ORDER — HYDROCORTISONE 2.5 % RE CREA: TOPICAL_CREAM | RECTAL | 0 refills | Status: DC

## 2017-03-26 MED ORDER — DOCUSATE SODIUM 100 MG PO CAPS: 100.0000 mg | ORAL_CAPSULE | Freq: Two times a day (BID) | ORAL | 0 refills | Status: DC

## 2017-03-26 NOTE — ED Provider Notes (Signed)
[facility] Provider Note   CSN: [id number] Arrival date & time: 03/26/17  1933     History   Chief Complaint Chief Complaint  Patient presents with  . Abdominal Pain    HPI Kayla [name] is a 26 y.o. female.  The history is provided by the patient and medical records.    26 year old female with no significant history presenting to the ED with abdominal pain.  Reports for the past several weeks she has had some intermittent abdominal cramping.  Reports this seems to wax and wane without known exacerbating or alleviating factors.  States she has been able to eat but does feel somewhat bloated afterwards.  She denies any vomiting.  No fever or chills.  Reports she does have issues with constipation but feels that her bowel movements lately has been normal.  She denies any urinary symptoms or vaginal complaints.  LMP was normal for her.  Patient also reported that today she has been having some rectal pain.  States at one point she was up walking around and felt something "wet" in her underwear along her rectum.  States she went to the bathroom but did not have a BM-- she wiped and noticed something "pink" on the tissue.  No large amount of blood, no clots.  Not on blood thinners.  No hx of GI issues.  No prior abdominal surgeries.  States she did try some tylenol or cramping without much relief.  Past Medical History:  Diagnosis Date  . History of frequent urinary tract infections   . Medical history non-contributory     Patient Active Problem List   Diagnosis Date Noted  . Class 1 obesity with body mass index (BMI) of 32.0 to 32.9 in adult 12/07/2016  . Pregnancy 09/01/2015  . Spontaneous vaginal delivery 09/01/2015  . Abdominal pain affecting pregnancy, antepartum 01/06/2015  . Family history of early CAD 02/18/2014  . Family history of early death 02/18/2014  . Routine gynecological examination 10/01/2012  . Unspecified symptom  associated with female genital organs 10/01/2012  . Contraception management 10/01/2012    Past Surgical History:  Procedure Laterality Date  . NO PAST SURGERIES      OB History    Gravida Para Term Preterm AB Living   2 2 2     1    SAB TAB Ectopic Multiple Live Births         0 1       Home Medications    Prior to Admission medications   Medication Sig Start Date End Date Taking? Authorizing Provider  magic mouthwash w/lidocaine SOLN Take 5 mLs by mouth 3 (three) times daily as needed for mouth pain. 12/10/16   Dorena BodoKennard, Lawrence, NP    Family History Family History  Problem Relation Age of Onset  . Heart disease Father [id number]       AMI  . Cancer Neg Hx   . Diabetes Neg Hx   . Stroke Neg Hx     Social History Social History  Substance Use Topics  . Smoking status: Never Smoker  . Smokeless tobacco: Never Used  . Alcohol use Yes     Comment: occ     Allergies   Penicillins   Review of Systems Review of Systems  Gastrointestinal: Positive for abdominal pain (cramping), anal bleeding and rectal pain.  All other systems reviewed and are negative.    Physical Exam Updated Vital Signs BP (!) 142/74 (BP Location: Left Arm)  Pulse 69   Temp 98.7 F (37.1 C) (Oral)   Resp 16   Ht 5' (1.524 m)   Wt 73.9 kg (163 lb)   LMP 03/06/2017 (Exact Date)   SpO2 100%   BMI 31.83 kg/m   Physical Exam  Constitutional: She is oriented to person, place, and time. She appears well-developed and well-nourished.  HENT:  Head: Normocephalic and atraumatic.  Mouth/Throat: Oropharynx is clear and moist.  Eyes: Pupils are equal, round, and reactive to light. Conjunctivae and EOM are normal.  Neck: Normal range of motion.  Cardiovascular: Normal rate, regular rhythm and normal heart sounds.   Pulmonary/Chest: Effort normal and breath sounds normal.  Abdominal: Soft. Bowel sounds are normal.  Genitourinary:  Genitourinary Comments: Very small external hemorrhoid at the 6  o'clock position; there is no active bleeding, no signs of thrombosis; no drainage or abscess formation; normal rectal tone  Musculoskeletal: Normal range of motion.  Neurological: She is alert and oriented to person, place, and time.  Skin: Skin is warm and dry.  Psychiatric: She has a normal mood and affect.  Nursing note and vitals reviewed.    ED Treatments / Results  Labs (all labs ordered are listed, but only abnormal results are displayed) Labs Reviewed  COMPREHENSIVE METABOLIC PANEL - Abnormal; Notable for the following:       Result Value   Glucose, Bld 138 (*)    ALT 12 (*)    All other components within normal limits  CBC - Abnormal; Notable for the following:    WBC 11.9 (*)    All other components within normal limits  URINALYSIS, ROUTINE W REFLEX MICROSCOPIC - Abnormal; Notable for the following:    APPearance HAZY (*)    All other components within normal limits  LIPASE, BLOOD  HCG, QUANTITATIVE, PREGNANCY    EKG  EKG Interpretation None       Radiology Dg Abd Acute W/chest  Result Date: 03/26/2017 CLINICAL DATA:  26 year old female with abdominal cramping. EXAM: DG ABDOMEN ACUTE W/ 1V CHEST COMPARISON:  None. FINDINGS: The lungs are clear. There is no pleural effusion or pneumothorax. The cardiac silhouette is within normal limits. Mild air distention of the stomach. There is moderate amount of stool throughout the colon. No bowel dilatation or evidence of obstruction. No free air or radiopaque calculi. The osseous structures and soft tissues are grossly unremarkable. IMPRESSION: Negative abdominal radiographs.  No acute cardiopulmonary disease. Electronically Signed   By: Elgie Collard M.D.   On: 03/26/2017 22:49    Procedures Procedures (including critical care time)  Medications Ordered in ED Medications - No data to display   Initial Impression / Assessment and Plan / ED Course  I have reviewed the triage vital signs and the nursing  notes.  Pertinent labs & imaging results that were available during my care of the patient were reviewed by me and considered in my medical decision making (see chart for details).  26 year old female here with intermittent abdominal cramping over the past few weeks.  Does report some issues with constipation.  She has not had any vomiting.  She is afebrile and nontoxic.  Abdomen is soft and benign.  Screening lab work overall reassuring.  Pregnancy test is negative.  Denies any urinary symptoms or vaginal complaints.  X-ray obtained, moderate stool burden noted but no obstructive process.  Will start on stool softener.  She did report some "pink" on the tissue today when wiping her bottom.  On rectal exam she  does have a small hemorrhoid that is not actively bleeding and no signs of thrombosis.  Will start on Anusol suppositories.  I discussed with patient that hemorrhoids can be secondary to constipation and straining for bowel movements.  Hopefully the combination of the 2 will help with her symptoms.  I recommended that she follow-up closely with her primary care doctor for any ongoing issues.  Discussed plan with patient, she acknowledged understanding and agreed with plan of care.  Return precautions given for new or worsening symptoms.  Final Clinical Impressions(s) / ED Diagnoses   Final diagnoses:  Hemorrhoids, unspecified hemorrhoid type  Abdominal cramping    New Prescriptions New Prescriptions   DOCUSATE SODIUM (COLACE) 100 MG CAPSULE    Take 1 capsule (100 mg total) by mouth every 12 (twelve) hours.   HYDROCORTISONE (ANUSOL-HC) 2.5 % RECTAL CREAM    Apply rectally 2 times daily     Garlon Hatchet, PA-C 03/26/17 2320    Nira Conn, MD 03/26/17 [phone]

## 2017-03-26 NOTE — Discharge Instructions (Signed)
Take the prescribed medication as directed.  Stool softener should help ease your bowel movements and help with some of the cramping.  This in turn should help with the hemorrhoid like we talked about. Follow-up with your primary care doctor if any ongoing issues. Return to the ED for new or worsening symptoms.

## 2017-03-26 NOTE — ED Triage Notes (Signed)
Pt reports gen abd cramping X1 week, also reports when she wiped after her BM today she saw "a little bit of blood" Denies N/V/D

## 2017-03-26 NOTE — ED Notes (Signed)
Patient transported to X-ray 

## 2017-05-11 ENCOUNTER — Encounter (HOSPITAL_COMMUNITY): Payer: Self-pay

## 2017-05-11 ENCOUNTER — Emergency Department (HOSPITAL_COMMUNITY)
Admission: EM | Admit: 2017-05-11 | Discharge: 2017-05-11 | Disposition: A | Discharge status: Home / Self Care | Payer: BLUE CROSS/BLUE SHIELD | Attending: Emergency Medicine | Admitting: Emergency Medicine

## 2017-05-11 ENCOUNTER — Other Ambulatory Visit: Payer: Self-pay

## 2017-05-11 DIAGNOSIS — Z79899 Other long term (current) drug therapy: Secondary | ICD-10-CM | POA: Insufficient documentation

## 2017-05-11 DIAGNOSIS — N3000 Acute cystitis without hematuria: Secondary | ICD-10-CM | POA: Insufficient documentation

## 2017-05-11 DIAGNOSIS — N898 Other specified noninflammatory disorders of vagina: Secondary | ICD-10-CM | POA: Diagnosis present

## 2017-05-11 DIAGNOSIS — B373 Candidiasis of vulva and vagina: Secondary | ICD-10-CM | POA: Insufficient documentation

## 2017-05-11 DIAGNOSIS — B3731 Acute candidiasis of vulva and vagina: Secondary | ICD-10-CM

## 2017-05-11 LAB — URINALYSIS, ROUTINE W REFLEX MICROSCOPIC
BILIRUBIN URINE: NEGATIVE
GLUCOSE, UA: NEGATIVE mg/dL
HGB URINE DIPSTICK: NEGATIVE
KETONES UR: NEGATIVE mg/dL
NITRITE: POSITIVE — AB
PH: 6 (ref 5.0–8.0)
Protein, ur: NEGATIVE mg/dL
Specific Gravity, Urine: 1.025 (ref 1.005–1.030)

## 2017-05-11 LAB — WET PREP, GENITAL
CLUE CELLS WET PREP: NONE SEEN
SPERM: NONE SEEN
Trich, Wet Prep: NONE SEEN

## 2017-05-11 LAB — POC URINE PREG, ED: Preg Test, Ur: NEGATIVE

## 2017-05-11 MED ORDER — CEPHALEXIN 500 MG PO CAPS: 500.0000 mg | ORAL_CAPSULE | Freq: Two times a day (BID) | ORAL | 0 refills | Status: AC

## 2017-05-11 MED ORDER — CEFTRIAXONE SODIUM 250 MG IJ SOLR
250.0000 mg | Freq: Once | INTRAMUSCULAR | Status: AC
  Administered 2017-05-11: 250 mg via INTRAMUSCULAR
  Filled 2017-05-11: qty 250

## 2017-05-11 MED ORDER — LIDOCAINE HCL (PF) 1 % IJ SOLN
1.2000 mL | Freq: Once | INTRAMUSCULAR | Status: AC
  Administered 2017-05-11: 1.2 mL via INTRADERMAL
  Filled 2017-05-11: qty 5

## 2017-05-11 MED ORDER — STERILE WATER FOR INJECTION IJ SOLN: 1.2000 mL | Freq: Once | INTRAMUSCULAR | Status: DC

## 2017-05-11 MED ORDER — AZITHROMYCIN 250 MG PO TABS
1000.0000 mg | ORAL_TABLET | Freq: Once | ORAL | Status: AC
  Administered 2017-05-11: 1000 mg via ORAL
  Filled 2017-05-11: qty 4

## 2017-05-11 MED ORDER — FLUCONAZOLE 150 MG PO TABS
150.0000 mg | ORAL_TABLET | Freq: Once | ORAL | Status: AC
  Administered 2017-05-11: 150 mg via ORAL
  Filled 2017-05-11 (×2): qty 1

## 2017-05-11 NOTE — Discharge Instructions (Addendum)
As discussed, drink plenty of fluids and keep your urine clear.  Take your entire course of antibiotics even if you feel better. Follow-up with your primary care provider and GYN.  Avoid sexual intercourse for at least 7 days and until all symptoms have resolved.  If you receive a call with positive results he will need to notify your partner to have them treated as well and avoid sexual intercourse for at least 7 days after completion of treatment.  Return sooner if symptoms worsen, fever, chills, nausea, vomiting, abdominal pain or any other new concerning symptoms in the meantime.

## 2017-05-11 NOTE — ED Triage Notes (Signed)
Patient complains of 1 week of pelvic pain with vaginal discharge x 1 week, no other associated symptoms. Alert and oriented

## 2017-05-11 NOTE — ED Notes (Signed)
Pelvic cart is set up and at bedside.

## 2017-05-11 NOTE — ED Notes (Signed)
Pt getting dressed for discharge.

## 2017-05-11 NOTE — ED Provider Notes (Signed)
[facility] Provider Note   CSN: [id number] Arrival date & time: 05/11/17  1054     History   Chief Complaint No chief complaint on file.   HPI Kayla [name] is a 26 y.o. female [phone] [id number] with no past medical history presenting with 1 week of progressive onset yellow vaginal discharge and intermittent cramping suprapubic discomfort.  She denies aggravating or alleviating factors.  She endorses a foul urine smell. she denies any fever, chills, dysuria, hematuria.  She denies any history of similar symptoms. Patient does endorse a history of unprotected intercourse  LMP 05/03/17  HPI  Past Medical History:  Diagnosis Date  . History of frequent urinary tract infections   . Medical history non-contributory     Patient Active Problem List   Diagnosis Date Noted  . Class 1 obesity with body mass index (BMI) of 32.0 to 32.9 in adult 12/07/2016  . Pregnancy 09/01/2015  . Spontaneous vaginal delivery 09/01/2015  . Abdominal pain affecting pregnancy, antepartum 01/06/2015  . Family history of early CAD 02/18/2014  . Family history of early death 02/18/2014  . Routine gynecological examination 10/01/2012  . Unspecified symptom associated with female genital organs 10/01/2012  . Contraception management 10/01/2012    Past Surgical History:  Procedure Laterality Date  . NO PAST SURGERIES      OB History    Gravida Para Term Preterm AB Living   2 2 2     1    SAB TAB Ectopic Multiple Live Births         0 1       Home Medications    Prior to Admission medications   Medication Sig Start Date End Date Taking? Authorizing Provider  cephALEXin (KEFLEX) 500 MG capsule Take 1 capsule (500 mg total) by mouth 2 (two) times daily for 7 days. 05/11/17 05/18/17  Mathews RobinsonsMitchell, Zhion Pevehouse B, PA-C  docusate sodium (COLACE) 100 MG capsule Take 1 capsule (100 mg total) by mouth every 12 (twelve) hours. 03/26/17   Garlon HatchetSanders, Lisa M, PA-C    hydrocortisone (ANUSOL-HC) 2.5 % rectal cream Apply rectally 2 times daily 03/26/17   Garlon HatchetSanders, Lisa M, PA-C  magic mouthwash w/lidocaine SOLN Take 5 mLs by mouth 3 (three) times daily as needed for mouth pain. 12/10/16   Dorena BodoKennard, Lawrence, NP    Family History Family History  Problem Relation Age of Onset  . Heart disease Father 6644       AMI  . Cancer Neg Hx   . Diabetes Neg Hx   . Stroke Neg Hx     Social History Social History   Tobacco Use  . Smoking status: Never Smoker  . Smokeless tobacco: Never Used  Substance Use Topics  . Alcohol use: Yes    Comment: occ  . Drug use: No     Allergies   Penicillins   Review of Systems Review of Systems  Constitutional: Negative for chills, fatigue and fever.  Respiratory: Negative for cough, shortness of breath, wheezing and stridor.   Cardiovascular: Negative for chest pain and palpitations.  Gastrointestinal: Negative for abdominal pain, blood in stool, diarrhea, nausea and vomiting.  Genitourinary: Positive for pelvic pain and vaginal discharge. Negative for difficulty urinating, dysuria, flank pain, frequency, genital sores and hematuria.       Foul-smelling urine.  Intermittent suprapubic cramping discomfort  Musculoskeletal: Negative for arthralgias, back pain, myalgias, neck pain and neck stiffness.  Skin: Negative for color change, pallor and rash.  Neurological: Negative  for dizziness, seizures, syncope, weakness, light-headedness and headaches.     Physical Exam Updated Vital Signs BP 121/83   Pulse 84   Temp 98.3 F (36.8 C) (Oral)   Resp 20   SpO2 99%   Physical Exam  Constitutional: She appears well-developed and well-nourished. No distress.  Afebrile, nontoxic appearing, sitting comfortably in bed no acute distress.  HENT:  Head: Normocephalic and atraumatic.  Eyes: Conjunctivae and EOM are normal.  Neck: Normal range of motion. Neck supple.  Cardiovascular: Normal rate, regular rhythm and normal  heart sounds.  No murmur heard. Pulmonary/Chest: Effort normal and breath sounds normal. No stridor. No respiratory distress. She has no wheezes. She has no rales.  Abdominal: Soft. She exhibits no distension and no mass. There is no tenderness. There is no rebound and no guarding.  No CVA tenderness  Genitourinary:  Genitourinary Comments: Thick yellow cervical discharge.  No cervical motion tenderness.  No adnexal mass or tenderness.  Musculoskeletal: Normal range of motion. She exhibits no edema.  Neurological: She is alert.  Skin: Skin is warm and dry. No rash noted. She is not diaphoretic. No erythema. No pallor.  Psychiatric: She has a normal mood and affect.  Nursing note and vitals reviewed.    ED Treatments / Results  Labs (all labs ordered are listed, but only abnormal results are displayed) Labs Reviewed  WET PREP, GENITAL - Abnormal; Notable for the following components:      Result Value   Yeast Wet Prep HPF POC PRESENT (*)    WBC, Wet Prep HPF POC FEW (*)    All other components within normal limits  URINALYSIS, ROUTINE W REFLEX MICROSCOPIC - Abnormal; Notable for the following components:   APPearance HAZY (*)    Nitrite POSITIVE (*)    Leukocytes, UA TRACE (*)    Bacteria, UA FEW (*)    Squamous Epithelial / LPF 6-30 (*)    All other components within normal limits  URINE CULTURE  RPR  HIV ANTIBODY (ROUTINE TESTING)  POC URINE PREG, ED  GC/CHLAMYDIA PROBE AMP ([place]) NOT AT [facility]    EKG  EKG Interpretation None       Radiology No results found.  Procedures Procedures (including critical care time)  Medications Ordered in ED Medications  fluconazole (DIFLUCAN) tablet 150 mg (not administered)  cefTRIAXone (ROCEPHIN) injection 250 mg (250 mg Intramuscular Given 05/11/17 1244)  azithromycin (ZITHROMAX) tablet 1,000 mg (1,000 mg Oral Given 05/11/17 1244)  lidocaine (PF) (XYLOCAINE) 1 % injection 1.2 mL (1.2 mLs Intradermal Given 05/11/17 1244)       Initial Impression / Assessment and Plan / ED Course  I have reviewed the triage vital signs and the nursing notes.  Pertinent labs & imaging results that were available during my care of the patient were reviewed by me and considered in my medical decision making (see chart for details).    Patient presenting with 1 week of intermittent suprapubic discomfort, foul-smelling urine and vaginal discharge. Afebrile, nontoxic-appearing  UA with evidence of UTI, ordered culture Pelvic exam with thick yellow discharge, no cervical motion tenderness. No adnexal mass or tenderness.  Patient was given one-time dose ceftriaxone, azithromycin and Diflucan while in the emergency department.  She is otherwise well-appearing, nontoxic and afebrile.  I do not suspect pelvic inflammatory disease.  Close follow-up with GYN and PCP.  Discussed strict return precautions and advised to return to the emergency department if experiencing any new or worsening symptoms. Instructions were understood and  patient agreed with discharge plan.  Final Clinical Impressions(s) / ED Diagnoses   Final diagnoses:  Acute cystitis without hematuria  Yeast vaginitis  Vaginal discharge    ED Discharge Orders        Ordered    cephALEXin (KEFLEX) 500 MG capsule  2 times daily     05/11/17 1216       Gregary Cromer 05/11/17 1359    Linwood Dibbles, MD 05/12/17 1236

## 2017-05-11 NOTE — ED Notes (Signed)
Patient able to ambulate independently  

## 2017-05-12 LAB — RPR: RPR Ser Ql: NONREACTIVE

## 2017-05-12 LAB — HIV ANTIBODY (ROUTINE TESTING W REFLEX): HIV Screen 4th Generation wRfx: NONREACTIVE

## 2017-05-13 LAB — GC/CHLAMYDIA PROBE AMP (~~LOC~~) NOT AT ARMC
Chlamydia: NEGATIVE
Neisseria Gonorrhea: NEGATIVE

## 2017-05-14 LAB — URINE CULTURE

## 2017-05-15 ENCOUNTER — Telehealth: Payer: Self-pay | Admitting: *Deleted

## 2017-05-15 NOTE — Telephone Encounter (Signed)
Post ED Visit - Positive Culture Follow-up  Culture report reviewed by antimicrobial stewardship pharmacist:  [x] Nathan Batchelder, Pharm.D. [] Jeremy Frens, Pharm.D., BCPS AQ-ID [] Mike Maccia, Pharm.D., BCPS [] Elizabeth Martin, Pharm.D., BCPS [] Minh Pham, Pharm.D., BCPS, AAHIVP [] Michelle Turner, Pharm.D., BCPS, AAHIVP [] Rachel Rumbarger, PharmD, BCPS [] Taylor Stone, PharmD, BCPS [] Andy Johnston, PharmD, BCPS  Positive urine culture Treated with Cephalexin, organism sensitive to the same and no further patient follow-up is required at this time.  Kayla [name] 05/15/2017, 9:31 AM   

## 2017-05-28 DIAGNOSIS — K439 Ventral hernia without obstruction or gangrene: Secondary | ICD-10-CM

## 2017-05-28 HISTORY — DX: Ventral hernia without obstruction or gangrene: K43.9

## 2017-09-12 ENCOUNTER — Ambulatory Visit: Payer: BLUE CROSS/BLUE SHIELD | Admitting: Family Medicine

## 2017-09-12 ENCOUNTER — Other Ambulatory Visit: Payer: Self-pay

## 2017-09-12 ENCOUNTER — Encounter: Payer: Self-pay | Admitting: Family Medicine

## 2017-09-12 VITALS — BP 134/78 | HR 90 | Temp 99.2°F | Resp 18 | Ht 62.84 in | Wt 168.4 lb

## 2017-09-12 DIAGNOSIS — R140 Abdominal distension (gaseous): Secondary | ICD-10-CM

## 2017-09-12 DIAGNOSIS — R1084 Generalized abdominal pain: Secondary | ICD-10-CM | POA: Diagnosis not present

## 2017-09-12 DIAGNOSIS — N898 Other specified noninflammatory disorders of vagina: Secondary | ICD-10-CM

## 2017-09-12 LAB — POCT WET + KOH PREP
TRICH BY WET PREP: ABSENT
YEAST BY KOH: ABSENT
Yeast by wet prep: ABSENT

## 2017-09-12 LAB — POCT URINALYSIS DIP (MANUAL ENTRY)
BILIRUBIN UA: NEGATIVE mg/dL
Bilirubin, UA: NEGATIVE
Glucose, UA: NEGATIVE mg/dL
Leukocytes, UA: NEGATIVE
Nitrite, UA: NEGATIVE
PROTEIN UA: NEGATIVE mg/dL
RBC UA: NEGATIVE
SPEC GRAV UA: 1.025 (ref 1.010–1.025)
UROBILINOGEN UA: 0.2 U/dL
pH, UA: 7 (ref 5.0–8.0)

## 2017-09-12 MED ORDER — FAMOTIDINE 20 MG PO TABS
20.0000 mg | ORAL_TABLET | Freq: Two times a day (BID) | ORAL | 0 refills | Status: DC
Start: 2017-09-12 — End: 2017-10-10

## 2017-09-12 NOTE — Patient Instructions (Addendum)
     IF you received an x-ray today, you will receive an invoice from [facility]. Please contact [facility] at [phone] with questions or concerns regarding your invoice.   IF you received labwork today, you will receive an invoice from [facility]. Please contact [facility] at [phone] with questions or concerns regarding your invoice.   Our billing staff will not be able to assist you with questions regarding bills from these companies.  You will be contacted with the lab results as soon as they are available. The fastest way to get your results is to activate your My Chart account. Instructions are located on the last page of this paperwork. If you have not heard from us regarding the results in 2 weeks, please contact this office.      Abdominal Pain, Adult Abdominal pain can be caused by many things. Often, abdominal pain is not serious and it gets better with no treatment or by being treated at home. However, sometimes abdominal pain is serious. Your health care provider will do a medical history and a physical exam to try to determine the cause of your abdominal pain. Follow these instructions at home:  Take over-the-counter and prescription medicines only as told by your health care provider. Do not take a laxative unless told by your health care provider.  Drink enough fluid to keep your urine clear or pale yellow.  Watch your condition for any changes.  Keep all follow-up visits as told by your health care provider. This is important. Contact a health care provider if:  Your abdominal pain changes or gets worse.  You are not hungry or you lose weight without trying.  You are constipated or have diarrhea for more than 2-3 days.  You have pain when you urinate or have a bowel movement.  Your abdominal pain wakes you up at night.  Your pain gets worse with meals, after eating, or with certain foods.  You are throwing up and cannot keep anything  down.  You have a fever. Get help right away if:  Your pain does not go away as soon as your health care provider told you to expect.  You cannot stop throwing up.  Your pain is only in areas of the abdomen, such as the right side or the left lower portion of the abdomen.  You have bloody or black stools, or stools that look like tar.  You have severe pain, cramping, or bloating in your abdomen.  You have signs of dehydration, such as: ? Dark urine, very little urine, or no urine. ? Cracked lips. ? Dry mouth. ? Sunken eyes. ? Sleepiness. ? Weakness. This information is not intended to replace advice given to you by your health care provider. Make sure you discuss any questions you have with your health care provider. Document Released: 02/21/2005 Document Revised: 12/02/2015 Document Reviewed: 10/26/2015 Elsevier Interactive Patient Education  2018 [employer]  

## 2017-09-12 NOTE — Progress Notes (Signed)
Chief Complaint  Patient presents with  . Establish Care  . Abdominal Pain    X 2 mth- on and off    HPI   She reports 2 months history of abdominal pain in the RUQ and LLQ with bloating that is aggravated by eating and it does not matter what she eats She denies nausea, vomiting or diarrhea She denies belching or flatulence She states that she even has this with yogurt and occasionally has referred pain to her left shoulder and arm She has her gallbladder and appendix still in place She denies fevers or chills She reports that she has daily BM that is soft stool She states that she has not tried anything for this pain  She does not linger on the toilet with BM She denies family history of colon cancer or colon polyps   4 review of systems  Past Medical History:  Diagnosis Date  . History of frequent urinary tract infections   . Medical history non-contributory     Current Outpatient Medications  Medication Sig Dispense Refill  . docusate sodium (COLACE) 100 MG capsule Take 1 capsule (100 mg total) by mouth every 12 (twelve) hours. (Patient not taking: Reported on 09/12/2017) 30 capsule 0  . famotidine (PEPCID) 20 MG tablet Take 1 tablet (20 mg total) by mouth 2 (two) times daily. 60 tablet 0  . hydrocortisone (ANUSOL-HC) 2.5 % rectal cream Apply rectally 2 times daily (Patient not taking: Reported on 09/12/2017) 30 g 0  . magic mouthwash w/lidocaine SOLN Take 5 mLs by mouth 3 (three) times daily as needed for mouth pain. (Patient not taking: Reported on 09/12/2017) 100 mL 0   No current facility-administered medications for this visit.     Allergies:  Allergies  Allergen Reactions  . Penicillins Itching    Past Surgical History:  Procedure Laterality Date  . NO PAST SURGERIES      Social History   Socioeconomic History  . Marital status: Single    Spouse name: Not on file  . Number of children: 2  . Years of education: Not on file  . Highest education level:  Not on file  Occupational History  . Not on file  Social Needs  . Financial resource strain: Not on file  . Food insecurity:    Worry: Not on file    Inability: Not on file  . Transportation needs:    Medical: Not on file    Non-medical: Not on file  Tobacco Use  . Smoking status: Never Smoker  . Smokeless tobacco: Never Used  Substance and Sexual Activity  . Alcohol use: Not Currently  . Drug use: No  . Sexual activity: Yes    Partners: Male  Lifestyle  . Physical activity:    Days per week: Not on file    Minutes per session: Not on file  . Stress: Not on file  Relationships  . Social connections:    Talks on phone: Not on file    Gets together: Not on file    Attends religious service: Not on file    Active member of club or organization: Not on file    Attends meetings of clubs or organizations: Not on file    Relationship status: Not on file  Other Topics Concern  . Not on file  Social History Narrative   Marital status: single; not dating.     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      Education: [employer] [address]      Tobacco: none      Alcohol: none      Drugs: none      Exercise:  None; active.          Family History  Problem Relation Age of Onset  . Heart disease Father 2244       AMI  . Cancer Neg Hx   . Diabetes Neg Hx   . Stroke Neg Hx      ROS Review of Systems See HPI Constitution: No fevers or chills No malaise No diaphoresis Skin: No rash or itching Eyes: no blurry vision, no double vision GU: no dysuria or hematuria Neuro: no dizziness or headaches * all others reviewed and negative   Objective: Vitals:   09/12/17 1408  BP: 134/78  Pulse: 90  Resp: 18  Temp: 99.2 F (37.3 C)  TempSrc: Oral  SpO2: 100%  Weight: 168 lb 6.4 oz (76.4 kg)  Height: 5' 2.84" (1.596 m)    Physical Exam  Constitutional: She appears well-developed  and well-nourished.  HENT:  Head: Normocephalic and atraumatic.  Eyes: Pupils are equal, round, and reactive to light. EOM are normal.  Cardiovascular: Normal rate, regular rhythm, normal heart sounds and intact distal pulses.  Pulmonary/Chest: Effort normal and breath sounds normal. No stridor. No respiratory distress. She has no wheezes. She has no rhonchi. She has no rales. She exhibits no tenderness.  Abdominal: Soft. Normal appearance, normal aorta and bowel sounds are normal. She exhibits distension. She exhibits no shifting dullness, no pulsatile liver, no fluid wave, no abdominal bruit, no ascites, no pulsatile midline mass and no mass. There is no hepatosplenomegaly. There is tenderness in the left lower quadrant. There is no rigidity, no rebound, no guarding, no CVA tenderness, no tenderness at McBurney's point and negative Murphy's sign. No hernia.  Nursing note and vitals reviewed.   Vaginal exam- Chaperone Present Labia normal bilaterally without skin lesions Urethral meatus normal appearing without erythema Vagina with white thin discharge No CMT, ovaries small and not palpable Uterus midline, nontender   Component     Latest Ref Rng & Units 09/12/2017 09/12/2017         2:24 PM  2:24 PM  Color, UA     yellow yellow   Clarity, UA     clear cloudy (A)   Glucose     negative mg/dL negative   Bilirubin, UA     negative negative negative  Specific Gravity, UA     1.010 - 1.025 1.025   RBC, UA     negative negative   pH, UA     5.0 - 8.0 7.0   Protein, UA     negative mg/dL negative   Urobilinogen, UA     0.2 or 1.0 E.U./dL 0.2   Nitrite, UA     Negative Negative   Leukocytes, UA     Negative Negative     Assessment and Plan Melton Alarierra was seen today for establish care and abdominal pain.  Diagnoses and all orders for this visit:  Generalized abdominal pain- will refer for CT abd pelvis -     POCT urinalysis dipstick -     GC/Chlamydia Probe Amp  Abdominal  distension (gaseous)- due to pain in the RUQ and LLQ with marked tenderness on the LLQ on exam will refer for CT Pelvic exam benign -  CT Abdomen Pelvis W Contrast; Future  Vaginal discharge- will screen for gc/chl Negative for trich, yeast and BV -     POCT Wet + KOH Prep -     GC/Chlamydia Probe Amp  TRIAL OF PEPCID ADVISED WHILE AWAITING CT -     famotidine (PEPCID) 20 MG tablet; Take 1 tablet (20 mg total) by mouth 2 (two) times daily.   A total of 30 minutes were spent face-to-face with the patient during this encounter and over half of that time was spent on counseling and coordination of care.   Tashia Leiterman A Kynisha Memon

## 2017-09-14 LAB — GC/CHLAMYDIA PROBE AMP
CHLAMYDIA, DNA PROBE: NEGATIVE
NEISSERIA GONORRHOEAE BY PCR: NEGATIVE

## 2017-09-16 ENCOUNTER — Telehealth: Payer: Self-pay | Admitting: Family Medicine

## 2017-09-16 NOTE — Telephone Encounter (Signed)
Tried getting pt ct approved but it was denied through pt insurance a peer to peer can be done by calling [employer] [phone] pt member id [id number] dob:May 23, 1991..Kayla [name]

## 2017-09-18 NOTE — Telephone Encounter (Signed)
Copied from CRM [id number]. Topic: Quick Communication - See Telephone Encounter >> Sep 18, 2017  1:07 PM Terisa Starraylor, Brittany L wrote: CRM for notification. See Telephone encounter for: 09/18/17.  Patient said she was in the office on 4/18 and Dr Creta LevinStallings was going to order her a CT scan. She said she has not heard from anyone in regards to this.  Please call back [phone]

## 2017-09-19 ENCOUNTER — Ambulatory Visit (HOSPITAL_COMMUNITY)
Admission: EM | Admit: 2017-09-19 | Discharge: 2017-09-19 | Disposition: A | Discharge status: Home / Self Care | Payer: BLUE CROSS/BLUE SHIELD | Attending: Internal Medicine | Admitting: Internal Medicine

## 2017-09-19 ENCOUNTER — Encounter (HOSPITAL_COMMUNITY): Payer: Self-pay | Admitting: Family Medicine

## 2017-09-19 DIAGNOSIS — R319 Hematuria, unspecified: Secondary | ICD-10-CM

## 2017-09-19 DIAGNOSIS — Z87440 Personal history of urinary (tract) infections: Secondary | ICD-10-CM | POA: Insufficient documentation

## 2017-09-19 DIAGNOSIS — N390 Urinary tract infection, site not specified: Secondary | ICD-10-CM | POA: Diagnosis not present

## 2017-09-19 DIAGNOSIS — Z880 Allergy status to penicillin: Secondary | ICD-10-CM | POA: Insufficient documentation

## 2017-09-19 LAB — POCT URINALYSIS DIP (DEVICE)
Bilirubin Urine: NEGATIVE
Glucose, UA: NEGATIVE mg/dL
Ketones, ur: NEGATIVE mg/dL
NITRITE: POSITIVE — AB
PH: 6 (ref 5.0–8.0)
Specific Gravity, Urine: >=1.03 (ref 1.005–1.030)
UROBILINOGEN UA: 0.2 mg/dL (ref 0.0–1.0)

## 2017-09-19 LAB — POCT PREGNANCY, URINE: Preg Test, Ur: NEGATIVE

## 2017-09-19 MED ORDER — CEPHALEXIN 500 MG PO CAPS: 500.0000 mg | ORAL_CAPSULE | Freq: Two times a day (BID) | ORAL | 0 refills | Status: AC

## 2017-09-19 NOTE — ED Triage Notes (Signed)
Pt here for hematuria, abd discomfort and urinary urgency.

## 2017-09-19 NOTE — ED Provider Notes (Signed)
[facility]    CSN: [id number] Arrival date & time: 09/19/17  1853     History   Chief Complaint Chief Complaint  Patient presents with  . Hematuria    HPI Kayla [name] is a 27 y.o. female.   Kayla [name] presents with complaints of urinary frequency, blood in urine, urgency, pelvic discomfort, and low back pain which started a few days ago. Abdominal pain comes and goes. Has had similar in the past with UTI's. No fevers. Denies vaginal symptoms. Has not taken any medications for symptoms. Has a history of UTI's.    ROS per HPI.      Past Medical History:  Diagnosis Date  . History of frequent urinary tract infections   . Medical history non-contributory     Patient Active Problem List   Diagnosis Date Noted  . Class 1 obesity with body mass index (BMI) of 32.0 to 32.9 in adult 12/07/2016  . Pregnancy 09/01/2015  . Spontaneous vaginal delivery 09/01/2015  . Abdominal pain affecting pregnancy, antepartum 01/06/2015  . Family history of early CAD 2014/03/19  . Family history of early death 03-19-2014  . Routine gynecological examination 10/01/2012  . Unspecified symptom associated with female genital organs 10/01/2012  . Contraception management 10/01/2012    Past Surgical History:  Procedure Laterality Date  . NO PAST SURGERIES      OB History    Gravida  2   Para  2   Term  2   Preterm      AB      Living  1     SAB      TAB      Ectopic      Multiple  0   Live Births  1            Home Medications    Prior to Admission medications   Medication Sig Start Date End Date Taking? Authorizing Provider  cephALEXin (KEFLEX) 500 MG capsule Take 1 capsule (500 mg total) by mouth 2 (two) times daily for 7 days. 09/19/17 09/26/17  Georgetta Haber, NP  famotidine (PEPCID) 20 MG tablet Take 1 tablet (20 mg total) by mouth 2 (two) times daily. 09/12/17   Doristine Bosworth, MD    Family History Family History  Problem Relation Age  of Onset  . Heart disease Father 28       AMI  . Cancer Neg Hx   . Diabetes Neg Hx   . Stroke Neg Hx     Social History Social History   Tobacco Use  . Smoking status: Never Smoker  . Smokeless tobacco: Never Used  Substance Use Topics  . Alcohol use: Not Currently  . Drug use: No     Allergies   Penicillins   Review of Systems Review of Systems   Physical Exam Triage Vital Signs ED Triage Vitals  Enc Vitals Group     BP 09/19/17 1951 131/72     Pulse Rate 09/19/17 1951 88     Resp 09/19/17 1951 18     Temp 09/19/17 1951 98.7 F (37.1 C)     Temp src --      SpO2 09/19/17 1951 100 %     Weight --      Height --      Head Circumference --      Peak Flow --      Pain Score 09/19/17 1930 4     Pain Loc --  Pain Edu? --      Excl. in GC? --    No data found.  Updated Vital Signs BP 131/72   Pulse 88   Temp 98.7 F (37.1 C)   Resp 18   LMP 08/31/2017 (Exact Date)   SpO2 100%   Physical Exam  Constitutional: She is oriented to person, place, and time. She appears well-developed and well-nourished. No distress.  Cardiovascular: Normal rate, regular rhythm and normal heart sounds.  Pulmonary/Chest: Effort normal and breath sounds normal.  Abdominal: There is tenderness in the left lower quadrant. There is no rigidity, no rebound, no guarding, no CVA tenderness, no tenderness at McBurney's point and negative Murphy's sign.  Very mild LLQ pressure on palpation   Neurological: She is alert and oriented to person, place, and time.  Skin: Skin is warm and dry.     UC Treatments / Results  Labs (all labs ordered are listed, but only abnormal results are displayed) Labs Reviewed  POCT URINALYSIS DIP (DEVICE) - Abnormal; Notable for the following components:      Result Value   Hgb urine dipstick LARGE (*)    Protein, ur >=300 (*)    Nitrite POSITIVE (*)    Leukocytes, UA TRACE (*)    All other components within normal limits  URINE CULTURE  POCT  PREGNANCY, URINE    EKG None Radiology No results found.  Procedures Procedures (including critical care time)  Medications Ordered in UC Medications - No data to display   Initial Impression / Assessment and Plan / UC Course  I have reviewed the triage vital signs and the nursing notes.  Pertinent labs & imaging results that were available during my care of the patient were reviewed by me and considered in my medical decision making (see chart for details).     Urine positive for leuks, nitrites and hgb, consistent with symptoms of UTI. Keflex initiated. Culture pending. Will notify of any positive findings and if any changes to treatment are needed.  Return precautions provided. Patient verbalized understanding and agreeable to plan.    Final Clinical Impressions(s) / UC Diagnoses   Final diagnoses:  Urinary tract infection with hematuria, site unspecified    ED Discharge Orders        Ordered    cephALEXin (KEFLEX) 500 MG capsule  2 times daily     09/19/17 1953       Controlled Substance Prescriptions [place] Controlled Substance Registry consulted? Not Applicable   Georgetta HaberBurky, Natalie B, NP 09/19/17 1958

## 2017-09-19 NOTE — Discharge Instructions (Signed)
Drink plenty of water and empty bladder regularly. Avoid caffeine and alcohol as these can be irritating to the bladder.  Complete course of antibiotics.  We will notify you of any changes to treatment if needed based on your urine culture. If symptoms worsen or do not improve in the next week to return to be seen or to follow up with your PCP.

## 2017-09-20 NOTE — Telephone Encounter (Signed)
Please let her know that the CT was denied by insurance. I see that she was in the Urgent Care and that she was treated for UTI. She should follow up here if she continues to have symptoms.

## 2017-09-20 NOTE — Telephone Encounter (Signed)
Left detailed VM advising pt.  

## 2017-09-22 LAB — URINE CULTURE: Culture: >=100000 — AB

## 2017-09-23 NOTE — Progress Notes (Signed)
Culture positive for E.Coli, this was treated with Keflex at recent visit. Attempted to reach patient regarding results. No answer at this time. Voicemail left.

## 2017-10-10 ENCOUNTER — Other Ambulatory Visit: Payer: Self-pay | Admitting: Family Medicine

## 2017-10-10 NOTE — Telephone Encounter (Signed)
Famotidine refill Last OV: 09/12/17 Last Refill:09/12/17 #60 no RF PCP: Collie Siad MD

## 2017-11-02 ENCOUNTER — Emergency Department (HOSPITAL_COMMUNITY)
Admission: EM | Admit: 2017-11-02 | Discharge: 2017-11-02 | Disposition: A | Discharge status: Home / Self Care | Payer: BLUE CROSS/BLUE SHIELD | Attending: Emergency Medicine | Admitting: Emergency Medicine

## 2017-11-02 ENCOUNTER — Encounter (HOSPITAL_COMMUNITY): Payer: Self-pay | Admitting: Emergency Medicine

## 2017-11-02 DIAGNOSIS — K439 Ventral hernia without obstruction or gangrene: Secondary | ICD-10-CM

## 2017-11-02 DIAGNOSIS — R1013 Epigastric pain: Secondary | ICD-10-CM | POA: Insufficient documentation

## 2017-11-02 LAB — COMPREHENSIVE METABOLIC PANEL
ALBUMIN: 3.8 g/dL (ref 3.5–5.0)
ALK PHOS: 62 U/L (ref 38–126)
ALT: 10 U/L — AB (ref 14–54)
AST: 16 U/L (ref 15–41)
Anion gap: 7 (ref 5–15)
BILIRUBIN TOTAL: 0.6 mg/dL (ref 0.3–1.2)
BUN: 9 mg/dL (ref 6–20)
CALCIUM: 9.1 mg/dL (ref 8.9–10.3)
CO2: 26 mmol/L (ref 22–32)
Chloride: 105 mmol/L (ref 101–111)
Creatinine, Ser: 0.75 mg/dL (ref 0.44–1.00)
GFR calc Af Amer: >60 mL/min (ref >60)
GFR calc non Af Amer: >60 mL/min (ref >60)
Glucose, Bld: 108 mg/dL — ABNORMAL HIGH (ref 65–99)
Potassium: 4 mmol/L (ref 3.5–5.1)
SODIUM: 138 mmol/L (ref 135–145)
TOTAL PROTEIN: 6.7 g/dL (ref 6.5–8.1)

## 2017-11-02 LAB — CBC
HEMATOCRIT: 41.7 % (ref 36.0–46.0)
HEMOGLOBIN: 13.5 g/dL (ref 12.0–15.0)
MCH: 28.2 pg (ref 26.0–34.0)
MCHC: 32.4 g/dL (ref 30.0–36.0)
MCV: 87.1 fL (ref 78.0–100.0)
Platelets: 364 10*3/uL (ref 150–400)
RBC: 4.79 MIL/uL (ref 3.87–5.11)
RDW: 12.4 % (ref 11.5–15.5)
WBC: 12.2 10*3/uL — ABNORMAL HIGH (ref 4.0–10.5)

## 2017-11-02 LAB — LIPASE, BLOOD: Lipase: 38 U/L (ref 11–51)

## 2017-11-02 LAB — I-STAT BETA HCG BLOOD, ED (MC, WL, AP ONLY)

## 2017-11-02 NOTE — ED Notes (Signed)
Pt understood dc material. NAD noted. 

## 2017-11-02 NOTE — ED Triage Notes (Signed)
Pt to ER for evaluation of "excruciating" generalized abd pain x10 days, states when she leans back she feels a "bump" in her umbilical area. Denies n/v/d.

## 2017-11-02 NOTE — Discharge Instructions (Addendum)
Please call surgery to schedule follow up Return if pain worsens and you are unable to relieve pain with lying flat

## 2017-11-02 NOTE — ED Provider Notes (Signed)
[facility] Provider Note   CSN: [id number] Arrival date & time: 11/02/17  1832     History   Chief Complaint Chief Complaint  Patient presents with  . Abdominal Pain    HPI Kayla [name] is a 27 y.o. female.  HPI  27 year old female G2, P2 LMP June 2, all normal menses presents today complaining of upper abdominal pain that is sharp and has been coming and going for 2 weeks.  Is worse with sitting up and straining.  She notes bulging intermittently.  Denies any worsening with food, nausea, vomiting, change in bowel habits, vaginal discharge, urinary tract infection symptoms, or pain with intercourse  Past Medical History:  Diagnosis Date  . History of frequent urinary tract infections   . Medical history non-contributory     Patient Active Problem List   Diagnosis Date Noted  . Class 1 obesity with body mass index (BMI) of 32.0 to 32.9 in adult 12/07/2016  . Pregnancy 09/01/2015  . Spontaneous vaginal delivery 09/01/2015  . Abdominal pain affecting pregnancy, antepartum 01/06/2015  . Family history of early CAD 02/18/2014  . Family history of early death 02/18/2014  . Routine gynecological examination 10/01/2012  . Unspecified symptom associated with female genital organs 10/01/2012  . Contraception management 10/01/2012    Past Surgical History:  Procedure Laterality Date  . NO PAST SURGERIES       OB History    Gravida  2   Para  2   Term  2   Preterm      AB      Living  1     SAB      TAB      Ectopic      Multiple  0   Live Births  1            Home Medications    Prior to Admission medications   Not on File    Family History Family History  Problem Relation Age of Onset  . Heart disease Father 2444       AMI  . Cancer Neg Hx   . Diabetes Neg Hx   . Stroke Neg Hx     Social History Social History   Tobacco Use  . Smoking status: Never Smoker  . Smokeless tobacco: Never  Used  Substance Use Topics  . Alcohol use: Not Currently  . Drug use: No     Allergies   Penicillins   Review of Systems Review of Systems  All other systems reviewed and are negative.    Physical Exam Updated Vital Signs BP 110/62   Pulse 63   SpO2 98%   Physical Exam  Constitutional: She appears well-developed and well-nourished.  HENT:  Head: Normocephalic and atraumatic.  Mouth/Throat: Oropharynx is clear and moist.  Eyes: Pupils are equal, round, and reactive to light. EOM are normal.  Cardiovascular: Normal rate, regular rhythm and normal heart sounds.  Pulmonary/Chest: Effort normal.  Abdominal: Normal appearance and bowel sounds are normal. There is no tenderness. A hernia is present. Hernia confirmed positive in the ventral area.  Patient with epigastric defect c.w. Ventral hernia, some bulging with increased intrabdominal pressure when sitting up.  Skin: Skin is warm and dry. Capillary refill takes less than 2 seconds.  Nursing note and vitals reviewed.    ED Treatments / Results  Labs (all labs ordered are listed, but only abnormal results are displayed) Labs Reviewed  COMPREHENSIVE METABOLIC PANEL -  Abnormal; Notable for the following components:      Result Value   Glucose, Bld 108 (*)    ALT 10 (*)    All other components within normal limits  CBC - Abnormal; Notable for the following components:   WBC 12.2 (*)    All other components within normal limits  LIPASE, BLOOD  I-STAT BETA HCG BLOOD, ED ([facility], [facility], [facility] ONLY)    EKG None  Radiology No results found.  Procedures Procedures (including critical care time)  Medications Ordered in ED Medications - No data to display   Initial Impression / Assessment and Plan / ED Course  I have reviewed the triage vital signs and the nursing notes.  Pertinent labs & imaging results that were available during my care of the patient were reviewed by me and considered in my medical decision making (see  chart for details).       Final Clinical Impressions(s) / ED Diagnoses   Final diagnoses:  Epigastric pain  Ventral hernia without obstruction or gangrene    ED Discharge Orders    None       Margarita Grizzle, MD 11/02/17 2314

## 2017-12-12 ENCOUNTER — Ambulatory Visit: Payer: BLUE CROSS/BLUE SHIELD | Admitting: Family Medicine

## 2017-12-12 ENCOUNTER — Encounter: Payer: Self-pay | Admitting: Family Medicine

## 2017-12-12 ENCOUNTER — Other Ambulatory Visit: Payer: Self-pay

## 2017-12-12 VITALS — BP 116/60 | HR 94 | Temp 98.8°F | Resp 17 | Ht 62.84 in | Wt 174.0 lb

## 2017-12-12 DIAGNOSIS — D72828 Other elevated white blood cell count: Secondary | ICD-10-CM

## 2017-12-12 DIAGNOSIS — Z23 Encounter for immunization: Secondary | ICD-10-CM | POA: Diagnosis not present

## 2017-12-12 DIAGNOSIS — M6208 Separation of muscle (nontraumatic), other site: Secondary | ICD-10-CM | POA: Diagnosis not present

## 2017-12-12 DIAGNOSIS — R8271 Bacteriuria: Secondary | ICD-10-CM | POA: Diagnosis not present

## 2017-12-12 LAB — POCT CBC
GRANULOCYTE PERCENT: 65.6 % (ref 37–80)
HCT, POC: 42.1 % (ref 37.7–47.9)
Hemoglobin: 12.9 g/dL (ref 12.2–16.2)
Lymph, poc: 3.3 (ref 0.6–3.4)
MCH: 25.7 pg — AB (ref 27–31.2)
MCHC: 30.7 g/dL — AB (ref 31.8–35.4)
MCV: 83.6 fL (ref 80–97)
MID (CBC): 0.9 (ref 0–0.9)
MPV: 7.2 fL (ref 0–99.8)
PLATELET COUNT, POC: 349 10*3/uL (ref 142–424)
POC Granulocyte: 7.9 — AB (ref 2–6.9)
POC LYMPH PERCENT: 27.2 %L (ref 10–50)
POC MID %: 7.2 %M (ref 0–12)
RBC: 5.03 M/uL (ref 4.04–5.48)
RDW, POC: 13 %
WBC: 12 10*3/uL — AB (ref 4.6–10.2)

## 2017-12-12 LAB — POCT URINALYSIS DIP (MANUAL ENTRY)
Bilirubin, UA: NEGATIVE
GLUCOSE UA: NEGATIVE mg/dL
Ketones, POC UA: NEGATIVE mg/dL
NITRITE UA: POSITIVE — AB
Protein Ur, POC: NEGATIVE mg/dL
RBC UA: NEGATIVE
Spec Grav, UA: 1.025 (ref 1.010–1.025)
UROBILINOGEN UA: 0.2 U/dL
pH, UA: 6.5 (ref 5.0–8.0)

## 2017-12-12 MED ORDER — NITROFURANTOIN MONOHYD MACRO 100 MG PO CAPS: 100.0000 mg | ORAL_CAPSULE | Freq: Two times a day (BID) | ORAL | 0 refills | Status: AC

## 2017-12-12 NOTE — Progress Notes (Signed)
Chief Complaint  Patient presents with  . white blood cell count elevated    ER f/u  . lump in belly    x couple months, some pain but not often, bloated feeling    HPI Patient was seen in the ER 11/02/2017 She reports that she was seen at that time for upper abdominal pain the was sharp and intermittents.  She states that she was diagnosed with a hernia She would like to know if her   Her last pregnancy was 2017 and previous pregnancy in 2012 She would desire another pregnancy She reports that she worries about the pain being worse in her pregnancy She denies any changes to her BM   Reviewed the chart with the patient She denies blood in her urine Previous history of UTI Denies fevers or chills Does not exercise regularly   Past Medical History:  Diagnosis Date  . History of frequent urinary tract infections   . Medical history non-contributory     Current Outpatient Medications  Medication Sig Dispense Refill  . nitrofurantoin, macrocrystal-monohydrate, (MACROBID) 100 MG capsule Take 1 capsule (100 mg total) by mouth 2 (two) times daily for 7 days. 14 capsule 0   No current facility-administered medications for this visit.     Allergies:  Allergies  Allergen Reactions  . Penicillins Itching    Past Surgical History:  Procedure Laterality Date  . NO PAST SURGERIES      Social History   Socioeconomic History  . Marital status: Single    Spouse name: Not on file  . Number of children: 2  . Years of education: Not on file  . Highest education level: Not on file  Occupational History  . Not on file  Social Needs  . Financial resource strain: Not on file  . Food insecurity:    Worry: Not on file    Inability: Not on file  . Transportation needs:    Medical: Not on file    Non-medical: Not on file  Tobacco Use  . Smoking status: Never Smoker  . Smokeless tobacco: Never Used  Substance and Sexual Activity  . Alcohol use: Not Currently  . Drug use: No    . Sexual activity: Yes    Partners: Male  Lifestyle  . Physical activity:    Days per week: Not on file    Minutes per session: Not on file  . Stress: Not on file  Relationships  . Social connections:    Talks on phone: Not on file    Gets together: Not on file    Attends religious service: Not on file    Active member of club or organization: Not on file    Attends meetings of clubs or organizations: Not on file    Relationship status: Not on file  Other Topics Concern  . Not on file  Social History Narrative   Marital status: single; not dating.     Children: 1 son (3yo)      Lives: with mom      Employment: Research officer, political party; moderately happy.      Education: [employer].      Tobacco: none      Alcohol: none      Drugs: none      Exercise:  None; active.          Family History  Problem Relation Age of Onset  . Heart disease Father 110       AMI  .  Cancer Neg Hx   . Diabetes Neg Hx   . Stroke Neg Hx      ROS Review of Systems See HPI Constitution: No fevers or chills No malaise No diaphoresis Skin: No rash or itching Eyes: no blurry vision, no double vision GU: no dysuria or hematuria Neuro: no dizziness or headaches  all others reviewed and negative   Objective: Vitals:   12/12/17 0849  BP: 116/60  Pulse: 94  Resp: 17  Temp: 98.8 F (37.1 C)  TempSrc: Oral  SpO2: 98%  Weight: 174 lb (78.9 kg)  Height: 5' 2.84" (1.596 m)    Physical Exam  Physical Exam  Constitutional: She is oriented to person, place, and time. She appears well-developed and well-nourished.  HENT:  Head: Normocephalic and atraumatic.  Eyes: Conjunctivae and EOM are normal.  Cardiovascular: Normal rate, regular rhythm and normal heart sounds.   Pulmonary/Chest: Effort normal and breath sounds normal. No respiratory distress. She has no wheezes.  Abdominal: Normal appearance and bowel sounds are normal. There is periumbilical  tenderness. There is no CVA tenderness. palpable abdominal wall defect in line with the umbilicus. Neurological: She is alert and oriented to person, place, and time.     Component     Latest Ref Rng & Units 11/02/2017 12/12/2017  WBC     4.6 - 10.2 K/uL 12.2 (H) 12.0 (A)  Lymph, poc     0.6 - 3.4  3.3  POC LYMPH PERCENT     10 - 50 %L  27.2  MID (cbc)     0 - 0.9  0.9  POC MID %     0 - 12 %M  7.2  POC Granulocyte     2 - 6.9  7.9 (A)  Granulocyte percent     37 - 80 %G  65.6  RBC     4.04 - 5.48 M/uL 4.79 5.03  Hemoglobin     12.2 - 16.2 g/dL 16.1 09.6  HCT     04.5 - 47.9 % 41.7 42.1  MCV     80 - 97 fL 87.1 83.6  MCH     27 - 31.2 pg 28.2 25.7 (A)  MCHC     31.8 - 35.4 g/dL 40.9 81.1 (A)  RDW, POC     %  13.0  Platelet Count, POC     142 - 424 K/uL  349  MPV     0 - 99.8 fL  7.2  RDW     11.5 - 15.5 % 12.4   Platelets     150 - 400 K/uL 364    Component     Latest Ref Rng & Units 12/12/2017 12/12/2017         9:40 AM  9:40 AM  Color, UA     yellow yellow   Clarity, UA     clear cloudy (A)   Glucose     negative mg/dL negative   Bilirubin, UA     negative negative negative  Specific Gravity, UA     1.010 - 1.025 1.025   RBC, UA     negative negative   pH, UA     5.0 - 8.0 6.5   Protein, UA     negative mg/dL negative   Urobilinogen, UA     0.2 or 1.0 E.U./dL 0.2   Nitrite, UA     Negative Positive (A)   Leukocytes, UA     Negative Trace (A)  Assessment and Plan Melton Alarierra was seen today for white blood cell count elevated and lump in belly.  Diagnoses and all orders for this visit:  Other elevated white blood cell (WBC) count -     POCT CBC -     POCT urinalysis dipstick -  Persistent elevation of wbc lead to additional testing and was found to have asymptomatic bacteruia Treating with macrobid and sending culture   Need for Tdap vaccination -     Tdap vaccine greater than or equal to 7yo IM  Diastasis recti- gave handout Advised  weight loss   Other orders -     nitrofurantoin, macrocrystal-monohydrate, (MACROBID) 100 MG capsule; Take 1 capsule (100 mg total) by mouth 2 (two) times daily for 7 days.   A total of 25 minutes were spent face-to-face with the patient during this encounter and over half of that time was spent on counseling and coordination of care.   Tait Balistreri A Darria Corvera

## 2017-12-12 NOTE — Patient Instructions (Signed)
     IF you received an x-ray today, you will receive an invoice from [facility]. Please contact [facility] at [phone] with questions or concerns regarding your invoice.   IF you received labwork today, you will receive an invoice from [facility]. Please contact [facility] at [phone] with questions or concerns regarding your invoice.   Our billing staff will not be able to assist you with questions regarding bills from these companies.  You will be contacted with the lab results as soon as they are available. The fastest way to get your results is to activate your My Chart account. Instructions are located on the last page of this paperwork. If you have not heard from us regarding the results in 2 weeks, please contact this office.     

## 2017-12-14 LAB — URINE CULTURE

## 2018-03-25 ENCOUNTER — Inpatient Hospital Stay (HOSPITAL_COMMUNITY)
Admission: AD | Admit: 2018-03-25 | Discharge: 2018-03-25 | Disposition: A | Discharge status: Home / Self Care | Payer: Medicaid Other | Source: Ambulatory Visit | Attending: Obstetrics & Gynecology | Admitting: Obstetrics & Gynecology

## 2018-03-25 ENCOUNTER — Encounter (HOSPITAL_COMMUNITY): Payer: Self-pay | Admitting: *Deleted

## 2018-03-25 ENCOUNTER — Inpatient Hospital Stay (HOSPITAL_COMMUNITY): Payer: Medicaid Other

## 2018-03-25 DIAGNOSIS — O26899 Other specified pregnancy related conditions, unspecified trimester: Secondary | ICD-10-CM

## 2018-03-25 DIAGNOSIS — O3680X0 Pregnancy with inconclusive fetal viability, not applicable or unspecified: Secondary | ICD-10-CM | POA: Diagnosis not present

## 2018-03-25 DIAGNOSIS — O26891 Other specified pregnancy related conditions, first trimester: Secondary | ICD-10-CM

## 2018-03-25 DIAGNOSIS — R109 Unspecified abdominal pain: Secondary | ICD-10-CM

## 2018-03-25 HISTORY — DX: Gestational (pregnancy-induced) hypertension without significant proteinuria, unspecified trimester: O13.9

## 2018-03-25 LAB — URINALYSIS, ROUTINE W REFLEX MICROSCOPIC
BILIRUBIN URINE: NEGATIVE
GLUCOSE, UA: NEGATIVE mg/dL
HGB URINE DIPSTICK: NEGATIVE
KETONES UR: 5 mg/dL — AB
LEUKOCYTES UA: NEGATIVE
Nitrite: NEGATIVE
PH: 7 (ref 5.0–8.0)
PROTEIN: NEGATIVE mg/dL
Specific Gravity, Urine: 1.004 — ABNORMAL LOW (ref 1.005–1.030)

## 2018-03-25 LAB — CBC
HCT: 39.4 % (ref 36.0–46.0)
HEMOGLOBIN: 13.6 g/dL (ref 12.0–15.0)
MCH: 29.4 pg (ref 26.0–34.0)
MCHC: 34.5 g/dL (ref 30.0–36.0)
MCV: 85.1 fL (ref 80.0–100.0)
Platelets: 355 10*3/uL (ref 150–400)
RBC: 4.63 MIL/uL (ref 3.87–5.11)
RDW: 12.7 % (ref 11.5–15.5)
WBC: 13.6 10*3/uL — ABNORMAL HIGH (ref 4.0–10.5)
nRBC: 0 % (ref 0.0–0.2)

## 2018-03-25 LAB — POCT PREGNANCY, URINE: Preg Test, Ur: POSITIVE — AB

## 2018-03-25 LAB — HCG, QUANTITATIVE, PREGNANCY: hCG, Beta Chain, Quant, S: 972 m[IU]/mL — ABNORMAL HIGH (ref <5)

## 2018-03-25 NOTE — MAU Note (Signed)
Pt reports she has been having lower abd pain and cramping for 3-4 days. Had positive HPT yesterday. Denies any vag bleeding or discharge.

## 2018-03-25 NOTE — Discharge Instructions (Signed)

## 2018-03-25 NOTE — MAU Provider Note (Signed)
History     CSN: [id number]  Arrival date and time: 03/25/18 5621   First Provider Initiated Contact with Patient 03/25/18 1020      Chief Complaint  Patient presents with  . Abdominal Pain   HPI   [name] is a 27 y.o. female G3P2001 @ [redacted]w[redacted]d here in MAU with complaints of abdominal pain. The pain started yesterday and continued through today. The pain is located in her lower abdomen; in the middle. The pain comes and goes. She has not taken anything for the pain. The pain does not radiate. Certain LMP.  History of GHTN with previous pregnancies.   Patient See's [facility]  OB History    Gravida  3   Para  2   Term  2   Preterm  0   AB  0   Living  2     SAB  0   TAB  0   Ectopic  0   Multiple  0   Live Births  2           Past Medical History:  Diagnosis Date  . History of frequent urinary tract infections   . Medical history non-contributory   . Pregnancy induced hypertension     Past Surgical History:  Procedure Laterality Date  . NO PAST SURGERIES      Family History  Problem Relation Age of Onset  . Heart disease Father 68       AMI  . Cancer Neg Hx   . Diabetes Neg Hx   . Stroke Neg Hx     Social History   Tobacco Use  . Smoking status: Never Smoker  . Smokeless tobacco: Never Used  Substance Use Topics  . Alcohol use: Not Currently  . Drug use: No    Allergies:  Allergies  Allergen Reactions  . Penicillins Itching and Rash    Has patient had a PCN reaction causing immediate rash, facial/tongue/throat swelling, SOB or lightheadedness with hypotension: No Has patient had a PCN reaction causing severe rash involving mucus membranes or skin necrosis: No Has patient had a PCN reaction that required hospitalization: No Has patient had a PCN reaction occurring within the last 10 years: No If all of the above answers are "NO", then may proceed with Cephalosporin use.     No medications prior to  admission.   Results for orders placed or performed during the hospital encounter of 03/25/18 (from the past 48 hour(s))  Urinalysis, Routine w reflex microscopic     Status: Abnormal   Collection Time: 03/25/18  9:25 AM  Result Value Ref Range   Color, Urine STRAW (A) YELLOW   APPearance CLEAR CLEAR   Specific Gravity, Urine 1.004 (L) 1.005 - 1.030   pH 7.0 5.0 - 8.0   Glucose, UA NEGATIVE NEGATIVE mg/dL   Hgb urine dipstick NEGATIVE NEGATIVE   Bilirubin Urine NEGATIVE NEGATIVE   Ketones, ur 5 (A) NEGATIVE mg/dL   Protein, ur NEGATIVE NEGATIVE mg/dL   Nitrite NEGATIVE NEGATIVE   Leukocytes, UA NEGATIVE NEGATIVE    Comment: Performed at [facility], [address], [place], [place] [address]  Pregnancy, urine POC     Status: Abnormal   Collection Time: 03/25/18  9:43 AM  Result Value Ref Range   Preg Test, Ur POSITIVE (A) NEGATIVE    Comment:        THE SENSITIVITY OF THIS METHODOLOGY IS >24 mIU/mL   CBC     Status:  Abnormal   Collection Time: 03/25/18 10:15 AM  Result Value Ref Range   WBC 13.6 (H) 4.0 - 10.5 K/uL   RBC 4.63 3.87 - 5.11 MIL/uL   Hemoglobin 13.6 12.0 - 15.0 g/dL   HCT 16.1 09.6 - 04.5 %   MCV 85.1 80.0 - 100.0 fL   MCH 29.4 26.0 - 34.0 pg   MCHC 34.5 30.0 - 36.0 g/dL   RDW 40.9 81.1 - 91.4 %   Platelets 355 150 - 400 K/uL   nRBC 0.0 0.0 - 0.2 %    Comment: Performed at [facility], [address], [place], [place] [address]  hCG, quantitative, pregnancy     Status: Abnormal   Collection Time: 03/25/18 10:15 AM  Result Value Ref Range   hCG, Beta Chain, Quant, S 972 (H) <5 mIU/mL    Comment:          GEST. AGE      CONC.  (mIU/mL)   <=1 WEEK        5 - 50     2 WEEKS       50 - 500     3 WEEKS       100 - 10,000     4 WEEKS     1,000 - 30,000     5 WEEKS     3,500 - 115,000   6-8 WEEKS     12,000 - 270,000    12 WEEKS     15,000 - 220,000        FEMALE AND NON-PREGNANT FEMALE:     LESS THAN 5 mIU/mL Performed at [facility], [address], [place], [place] [address]    US Ob Less Than 14 Weeks With Ob Transvaginal  Result Date: 03/25/2018 CLINICAL DATA:  Abdominal pain during pregnancy. Gestational age by LMP of 4 weeks 0 days. EXAM: OBSTETRIC <14 WK [place] AND TRANSVAGINAL OB US TECHNIQUE: Both transabdominal and transvaginal ultrasound examinations were performed for complete evaluation of the gestation as well as the maternal uterus, adnexal regions, and pelvic cul-de-sac. Transvaginal technique was performed to assess early pregnancy. COMPARISON:  None FINDINGS: Intrauterine gestational sac: None Maternal uterus/adnexae: Endometrial thickness measures 17 mm. Both ovaries are normal in appearance. No mass or abnormal free fluid identified. IMPRESSION: Pregnancy of unknown anatomic location (no intrauterine gestational sac or adnexal mass identified). Differential diagnosis includes recent spontaneous abortion, IUP too early to visualize, and non-visualized ectopic pregnancy. Recommend correlation with serial beta-hCG levels, and follow up US if warranted clinically. Electronically Signed   By: Myles Rosenthal M.D.   On: 03/25/2018 11:47   Review of Systems  Constitutional: Negative for fever.  Gastrointestinal: Positive for abdominal pain. Negative for nausea and vomiting.  Genitourinary: Negative for dysuria, vaginal bleeding and vaginal discharge.   Physical Exam   Blood pressure 139/77, pulse 87, temperature 98.5 F (36.9 C), temperature source Oral, resp. rate 18, height 5\' 1"  (1.549 m), weight 80.3 kg, last menstrual period 02/25/2018, SpO2 97 %.  Physical Exam  Constitutional: She is oriented to person, place, and time. She appears well-developed and well-nourished. No distress.  HENT:  Head: Normocephalic.  Eyes: Pupils are equal, round, and reactive to light.  GI: Soft. She exhibits no distension and no mass. There is no tenderness. There is no rebound and no guarding.  Musculoskeletal: Normal range of motion.   Neurological: She is alert and oriented to person, place, and time.  Skin: Skin is warm. She is not diaphoretic.  Psychiatric: Her behavior is normal.   MAU Course  Procedures  None  MDM  B positive blood type. CBC, Hcg, ABO US OB transvaginal  Reviewed the [place] results in detail with the patient and significant other.   Assessment and Plan   A:  1. Pregnancy of unknown anatomic location   2. Abdominal pain during pregnancy     P:  Discharge home with strict return precautions I called [facility] office and spoke to [facility]; patient should have a stat hcg in 48 hours in the office. Regan to call patient to schedule Pelvic rest Return to MAU if pain worsens  Ectopic precautions   Phung Kotas, Harolyn Rutherford, NP 03/25/2018 1:43 PM

## 2018-03-26 LAB — HIV ANTIBODY (ROUTINE TESTING W REFLEX): HIV SCREEN 4TH GENERATION: NONREACTIVE

## 2018-07-03 ENCOUNTER — Encounter (HOSPITAL_COMMUNITY)
Admission: AD | Disposition: A | Discharge status: Home / Self Care | Payer: Self-pay | Source: Home / Self Care | Attending: Obstetrics and Gynecology

## 2018-07-03 ENCOUNTER — Encounter (HOSPITAL_COMMUNITY): Payer: Self-pay | Admitting: *Deleted

## 2018-07-03 ENCOUNTER — Inpatient Hospital Stay (HOSPITAL_COMMUNITY): Payer: Medicaid Other | Admitting: Anesthesiology

## 2018-07-03 ENCOUNTER — Other Ambulatory Visit: Payer: Self-pay

## 2018-07-03 ENCOUNTER — Observation Stay (HOSPITAL_COMMUNITY)
Admission: AD | Admit: 2018-07-03 | Discharge: 2018-07-04 | Disposition: A | Discharge status: Home / Self Care | Payer: Medicaid Other | Attending: Obstetrics and Gynecology | Admitting: Obstetrics and Gynecology

## 2018-07-03 DIAGNOSIS — Z9889 Other specified postprocedural states: Secondary | ICD-10-CM

## 2018-07-03 DIAGNOSIS — Z3A18 18 weeks gestation of pregnancy: Secondary | ICD-10-CM | POA: Diagnosis not present

## 2018-07-03 DIAGNOSIS — O3432 Maternal care for cervical incompetence, second trimester: Secondary | ICD-10-CM | POA: Diagnosis not present

## 2018-07-03 HISTORY — DX: Ventral hernia without obstruction or gangrene: K43.9

## 2018-07-03 HISTORY — PX: CERVICAL CERCLAGE: SHX1329

## 2018-07-03 LAB — CBC
HCT: 36.5 % (ref 36.0–46.0)
Hemoglobin: 12.2 g/dL (ref 12.0–15.0)
MCH: 29 pg (ref 26.0–34.0)
MCHC: 33.4 g/dL (ref 30.0–36.0)
MCV: 86.9 fL (ref 80.0–100.0)
NRBC: 0 % (ref 0.0–0.2)
Platelets: 264 10*3/uL (ref 150–400)
RBC: 4.2 MIL/uL (ref 3.87–5.11)
RDW: 13.5 % (ref 11.5–15.5)
WBC: 15.6 10*3/uL — ABNORMAL HIGH (ref 4.0–10.5)

## 2018-07-03 LAB — TYPE AND SCREEN
ABO/RH(D): B POS
Antibody Screen: NEGATIVE

## 2018-07-03 SURGERY — CERCLAGE, CERVIX, VAGINAL APPROACH
Anesthesia: Spinal

## 2018-07-03 MED ORDER — FENTANYL CITRATE (PF) 100 MCG/2ML IJ SOLN: 25.0000 ug | INTRAMUSCULAR | Status: DC | PRN

## 2018-07-03 MED ORDER — DOCUSATE SODIUM 100 MG PO CAPS: 100.0000 mg | ORAL_CAPSULE | Freq: Every day | ORAL | Status: DC

## 2018-07-03 MED ORDER — BUPIVACAINE IN DEXTROSE 0.75-8.25 % IT SOLN
INTRATHECAL | Status: DC | PRN
  Administered 2018-07-03: 1.3 mL via INTRATHECAL

## 2018-07-03 MED ORDER — ACETAMINOPHEN 10 MG/ML IV SOLN
1000.0000 mg | Freq: Once | INTRAVENOUS | Status: DC | PRN
  Administered 2018-07-03: 1000 mg via INTRAVENOUS

## 2018-07-03 MED ORDER — ACETAMINOPHEN 325 MG PO TABS
650.0000 mg | ORAL_TABLET | ORAL | Status: DC | PRN
  Administered 2018-07-04: 650 mg via ORAL
  Filled 2018-07-03: qty 2

## 2018-07-03 MED ORDER — SODIUM CHLORIDE 0.9% FLUSH: 3.0000 mL | Freq: Two times a day (BID) | INTRAVENOUS | Status: DC

## 2018-07-03 MED ORDER — SODIUM CHLORIDE 0.9% FLUSH: 3.0000 mL | INTRAVENOUS | Status: DC | PRN

## 2018-07-03 MED ORDER — SODIUM CHLORIDE 0.9 % IV SOLN: 250.0000 mL | INTRAVENOUS | Status: DC | PRN

## 2018-07-03 MED ORDER — PROMETHAZINE HCL 25 MG/ML IJ SOLN: 6.2500 mg | INTRAMUSCULAR | Status: DC | PRN

## 2018-07-03 MED ORDER — PRENATAL MULTIVITAMIN CH: 1.0000 | ORAL_TABLET | Freq: Every day | ORAL | Status: DC

## 2018-07-03 MED ORDER — LACTATED RINGERS IV SOLN
INTRAVENOUS | Status: DC
  Administered 2018-07-03: 18:00:00 via INTRAVENOUS

## 2018-07-03 MED ORDER — SODIUM CHLORIDE 0.9 % IR SOLN
Status: DC | PRN
  Administered 2018-07-03: 1

## 2018-07-03 MED ORDER — CALCIUM CARBONATE ANTACID 500 MG PO CHEW: 2.0000 | CHEWABLE_TABLET | ORAL | Status: DC | PRN

## 2018-07-03 MED ORDER — ACETAMINOPHEN 10 MG/ML IV SOLN
INTRAVENOUS | Status: AC
  Filled 2018-07-03: qty 100

## 2018-07-03 MED ORDER — ZOLPIDEM TARTRATE 5 MG PO TABS: 5.0000 mg | ORAL_TABLET | Freq: Every evening | ORAL | Status: DC | PRN

## 2018-07-03 SURGICAL SUPPLY — 22 items
BLADE SURG 11 STRL SS (BLADE) ×2 IMPLANT
CANISTER SUCT 3000ML PPV (MISCELLANEOUS) ×2 IMPLANT
CATH FOLEY 2WAY SLVR 30CC 16FR (CATHETERS) IMPLANT
ELECT REM PT RETURN 9FT ADLT (ELECTROSURGICAL) ×2
ELECTRODE REM PT RTRN 9FT ADLT (ELECTROSURGICAL) ×1 IMPLANT
GLOVE BIO SURGEON STRL SZ7 (GLOVE) ×2 IMPLANT
GLOVE BIOGEL PI IND STRL 7.0 (GLOVE) ×1 IMPLANT
GLOVE BIOGEL PI INDICATOR 7.0 (GLOVE) ×1
GOWN STRL REUS W/TWL LRG LVL3 (GOWN DISPOSABLE) ×4 IMPLANT
NDL MAYO CATGUT SZ4 TPR NDL (NEEDLE) ×1 IMPLANT
NEEDLE MAYO CATGUT SZ4 (NEEDLE) ×2 IMPLANT
NS IRRIG 1000ML POUR BTL (IV SOLUTION) ×2 IMPLANT
PACK VAGINAL MINOR WOMEN LF (CUSTOM PROCEDURE TRAY) ×2 IMPLANT
PAD OB MATERNITY 4.3X12.25 (PERSONAL CARE ITEMS) ×2 IMPLANT
PAD PREP 24X48 CUFFED NSTRL (MISCELLANEOUS) ×2 IMPLANT
PENCIL BUTTON HOLSTER BLD 10FT (ELECTRODE) ×2 IMPLANT
SUT TICRON 2 BLUE 36 GS-21 (SUTURE) ×4 IMPLANT
SYR 30ML LL (SYRINGE) IMPLANT
TOWEL OR 17X24 6PK STRL BLUE (TOWEL DISPOSABLE) ×4 IMPLANT
TRAY FOLEY W/BAG SLVR 14FR (SET/KITS/TRAYS/PACK) ×2 IMPLANT
TUBING NON-CON 1/4 X 20 CONN (TUBING) ×2 IMPLANT
YANKAUER SUCT BULB TIP NO VENT (SUCTIONS) ×2 IMPLANT
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
## 2018-07-03 NOTE — H&P (Signed)
28 y.o. [id number] [redacted]w[redacted]d with funneling down to external os and dilation of os to 1 cm.  Pt last ate at 10 am.  I counseled her about a rescue cerclage and she agrees.  Will do with dr. Judeth Cornfield.  She has had two prior pregnancies without any complications.  Anatomy on exam today was complete and normal.   Past Medical History:  Diagnosis Date  . History of frequent urinary tract infections   . Medical history non-contributory   . Pregnancy induced hypertension    Past Surgical History:  Procedure Laterality Date  . NO PAST SURGERIES      Social History   Socioeconomic History  . Marital status: Single    Spouse name: Not on file  . Number of children: 2  . Years of education: Not on file  . Highest education level: Not on file  Occupational History  . Not on file  Social Needs  . Financial resource strain: Not on file  . Food insecurity:    Worry: Not on file    Inability: Not on file  . Transportation needs:    Medical: Not on file    Non-medical: Not on file  Tobacco Use  . Smoking status: Never Smoker  . Smokeless tobacco: Never Used  Substance and Sexual Activity  . Alcohol use: Not Currently  . Drug use: No  . Sexual activity: Yes    Partners: Male  Lifestyle  . Physical activity:    Days per week: Not on file    Minutes per session: Not on file  . Stress: Not on file  Relationships  . Social connections:    Talks on phone: Not on file    Gets together: Not on file    Attends religious service: Not on file    Active member of club or organization: Not on file    Attends meetings of clubs or organizations: Not on file    Relationship status: Not on file  . Intimate partner violence:    Fear of current or ex partner: Not on file    Emotionally abused: Not on file    Physically abused: Not on file    Forced sexual activity: Not on file  Other Topics Concern  . Not on file  Social History Narrative   Marital status: single; not dating.     Children: 1 son  (3yo)      Lives: with mom      Employment: Research officer, political party; moderately happy.      Education: [employer] off [employer].      Tobacco: none      Alcohol: none      Drugs: none      Exercise:  None; active.          No current facility-administered medications on file prior to encounter.    Current Outpatient Medications on File Prior to Encounter  Medication Sig Dispense Refill  . Prenatal Vit-Fe Fumarate-FA (PRENATAL MULTIVITAMIN) TABS tablet Take 1 tablet by mouth daily at 12 noon.      Allergies  Allergen Reactions  . Penicillins Itching and Rash    Has patient had a PCN reaction causing immediate rash, facial/tongue/throat swelling, SOB or lightheadedness with hypotension: No Has patient had a PCN reaction causing severe rash involving mucus membranes or skin necrosis: No Has patient had a PCN reaction that required hospitalization: No Has patient had a PCN reaction occurring within the last 10 years: No If all of  the above answers are "NO", then may proceed with Cephalosporin use.     There were no vitals filed for this visit.  Lungs: clear to ascultation Cor:  RRR Abdomen:  soft, nontender, nondistended. Ex:  no cords, erythema Pelvic:  Deferred for now  A:  28 y.o. [id number] [redacted]w[redacted]d with funneling and likely dilated cervix.  For cerclage.    P: P: All risks, benefits and alternatives d/w patient and she desires to proceed.    Loney Laurence

## 2018-07-03 NOTE — Anesthesia Preprocedure Evaluation (Addendum)
Anesthesia Evaluation  Patient identified by MRN, date of birth, ID band Patient awake    Reviewed: Allergy & Precautions, NPO status , Patient's Chart, lab work & pertinent test results  Airway Mallampati: II  TM Distance: >3 FB Neck ROM: Full    Dental  (+) Teeth Intact   Pulmonary neg pulmonary ROS,    Pulmonary exam normal breath sounds clear to auscultation       Cardiovascular negative cardio ROS Normal cardiovascular exam Rhythm:Regular Rate:Normal     Neuro/Psych negative neurological ROS  negative psych ROS   GI/Hepatic negative GI ROS, Neg liver ROS,   Endo/Other  negative endocrine ROS  Renal/GU negative Renal ROS  negative genitourinary   Musculoskeletal negative musculoskeletal ROS (+)   Abdominal   Peds negative pediatric ROS (+)  Hematology negative hematology ROS (+)   Anesthesia Other Findings   Reproductive/Obstetrics (+) Pregnancy Cervical incompetence                            Anesthesia Physical Anesthesia Plan  ASA: II  Anesthesia Plan: Spinal   Post-op Pain Management:    Induction:   PONV Risk Score and Plan: 2 and Treatment may vary due to age or medical condition  Airway Management Planned: Natural Airway  Additional Equipment:   Intra-op Plan:   Post-operative Plan:   Informed Consent: I have reviewed the patients History and Physical, chart, labs and discussed the procedure including the risks, benefits and alternatives for the proposed anesthesia with the patient or authorized representative who has indicated his/her understanding and acceptance.       Plan Discussed with: CRNA  Anesthesia Plan Comments:        Anesthesia Quick Evaluation

## 2018-07-03 NOTE — Anesthesia Postprocedure Evaluation (Signed)
Anesthesia Post Note  Patient: Kayla [name]  Procedure(s) Performed: CERCLAGE CERVICAL (N/A )     Patient location during evaluation: PACU Anesthesia Type: Spinal Level of consciousness: awake and alert Pain management: pain level controlled Vital Signs Assessment: post-procedure vital signs reviewed and stable Respiratory status: spontaneous breathing, nonlabored ventilation and respiratory function stable Cardiovascular status: blood pressure returned to baseline and stable Postop Assessment: no apparent nausea or vomiting and spinal receding Anesthetic complications: no    Last Vitals:  Vitals:   07/03/18 1930 07/03/18 1945  BP: (!) 118/55 (!) 120/59  Pulse: (!) 101 (!) 105  Resp: 19 (!) 24  Temp: 37.3 C   SpO2: 98% 100%    Last Pain:  Vitals:   07/03/18 1945  TempSrc:   PainSc: 0-No pain   Pain Goal:                Epidural/Spinal Function Cutaneous sensation: Tingles (07/03/18 1945), Patient able to flex knees: Yes (07/03/18 1945), Patient able to lift hips off bed: No (07/03/18 1945), Back pain beyond tenderness at insertion site: No (07/03/18 1945), Progressively worsening motor and/or sensory loss: No (07/03/18 1945), Bowel and/or bladder incontinence post epidural: No (07/03/18 1945)  Kaylyn Layer

## 2018-07-03 NOTE — Progress Notes (Signed)
Vitals:   07/03/18 1641  BP: (!) 142/76  Pulse: (!) 106  Resp: 16  Temp: 99.5 F (37.5 C)  TempSrc: Oral  Weight: 81.2 kg  Height: 5\' 1"  (1.549 m)    Results for orders placed or performed during the hospital encounter of 07/03/18 (from the past 24 hour(s))  CBC on admission     Status: Abnormal   Collection Time: 07/03/18  4:48 PM  Result Value Ref Range   WBC 15.6 (H) 4.0 - 10.5 K/uL   RBC 4.20 3.87 - 5.11 MIL/uL   Hemoglobin 12.2 12.0 - 15.0 g/dL   HCT 88.9 16.9 - 45.0 %   MCV 86.9 80.0 - 100.0 fL   MCH 29.0 26.0 - 34.0 pg   MCHC 33.4 30.0 - 36.0 g/dL   RDW 38.8 82.8 - 00.3 %   Platelets 264 150 - 400 K/uL   nRBC 0.0 0.0 - 0.2 %

## 2018-07-03 NOTE — Transfer of Care (Signed)
Immediate Anesthesia Transfer of Care Note  Patient: Kayla [name]  Procedure(s) Performed: CERCLAGE CERVICAL (N/A )  Patient Location: PACU  Anesthesia Type:Spinal  Level of Consciousness: awake  Airway & Oxygen Therapy: Patient Spontanous Breathing  Post-op Assessment: Report given to RN and Post -op Vital signs reviewed and stable  Post vital signs: stable  Last Vitals:  Vitals Value Taken Time  BP 113/93 07/03/2018  7:07 PM  Temp    Pulse 111 07/03/2018  7:10 PM  Resp 19 07/03/2018  7:10 PM  SpO2 100 % 07/03/2018  7:10 PM  Vitals shown include unvalidated device data.  Last Pain:  Vitals:   07/03/18 1641  TempSrc: Oral         Complications: No apparent anesthesia complications

## 2018-07-03 NOTE — Anesthesia Procedure Notes (Signed)
Spinal  Patient location during procedure: OR Start time: 07/03/2018 6:37 PM End time: 07/03/2018 6:41 PM Staffing Anesthesiologist: Kaylyn Layer, MD Performed: anesthesiologist  Preanesthetic Checklist Completed: patient identified, surgical consent, pre-op evaluation, timeout performed, IV checked, risks and benefits discussed and monitors and equipment checked Spinal Block Patient position: sitting Prep: site prepped and draped and DuraPrep Patient monitoring: cardiac monitor, continuous pulse ox and blood pressure Approach: midline Location: L3-4 Injection technique: single-shot Needle Needle type: Pencan  Needle gauge: 24 G Needle length: 9 cm Additional Notes Risks, benefits, and alternative discussed. Patient gave consent to procedure. Prepped and draped in sitting position. Clear CSF obtained after one needle redirection. Positive terminal aspiration. No pain or paraesthesias with injection. Patient tolerated procedure well. Vital signs stable. Amalia Greenhouse, MD

## 2018-07-03 NOTE — Brief Op Note (Signed)
07/03/2018  7:01 PM  PATIENT:  Kayla [name]  28 y.o. female  PRE-OPERATIVE DIAGNOSIS:  18-weeks' gestation with ncompetent cervix  POST-OPERATIVE DIAGNOSIS:  Same with cerclage  PROCEDURE:  Procedure(s): CERCLAGE CERVICAL (N/A)  SURGEON:  Surgeon(s) and Role:  ** Noralee Space, MD - Primary   * Carrington Clamp, MD - Assisting *Odelia Gage, RN      PHYSICIAN ASSISTANT:   ASSISTANTS: none   ANESTHESIA:   spinal  EBL:  5 mL   BLOOD ADMINISTERED:none  DRAINS: none   LOCAL MEDICATIONS USED:  NONE  SPECIMEN:  No Specimen  DISPOSITION OF SPECIMEN:  N/A  COUNTS:  YES  TOURNIQUET:  * No tourniquets in log *  DICTATION: .Note written in EPIC and Other Dictation: Dictation Number   PLAN OF CARE: Admit for overnight observation  PATIENT DISPOSITION:  PACU - hemodynamically stable.   Delay start of Pharmacological VTE agent (>24hrs) due to surgical blood loss or risk of bleeding: not applicable

## 2018-07-03 NOTE — Consult Note (Signed)
Maternal-Fetal Medicine  Name: Kayla [name]. Kayla [name] MRN: [id number] Requesting Provider: Carrington Clamp, MD  [name], [place] P2 at 18-weeks' gestation, is admitted with the diagnosis of cervical incompetence. She had routine fetal anatomy scan with MFM, [facility]. On transvaginal scan, funneling was seen and the cervix was 1 cm dilated (?). Patient does not have abdominal pain or vaginal bleeding or uterine contractions.  She does not have fever. She had E.coli urine infection in July 2019.  Obstetric history is significant for 2 previous term vaginal deliveries. Gyn: No history of abnormal Pap smears or cervical surgeries. Medications: low-dose aspirin, prenatal vitamins. Allergies: Penicillin (rashes) Social: Denies tobacco or drug or alcohol use.  P/E: Patient is comfortably lying in bed; not in distress. Vitals stable Abd: Soft gravid uterus; no tenderness.  Labs: Hb 13.6, Hct 39.4, PLT 355, WBC 13.6  Based on ultrasound findings performed at [facility], the patient was counseled on rescue cerclage by Dr. Henderson Cloud and the patient was admitted.  I explained cervical incompetence with diagrams. I explained cerclage procedure and its possible complications including miscarriage, PPROM, infection, hemorrhage, and injuries to bladder or bowel (rare).  If membranes are advanced into the vagina, the success rate of cerclage is very low. I also informed her that cerclage does not guarantee carrying pregnancy to term.  Our plan is to perform detailed examination in the OR and proceed with cerclage.  OR time arranged by Dr. Henderson Cloud.  Thank you for your consult. Please do not hesitate to contact me if you have any questions or concerns.  Consultation including face-to-face counseling: 40 min.

## 2018-07-04 DIAGNOSIS — O3432 Maternal care for cervical incompetence, second trimester: Secondary | ICD-10-CM | POA: Diagnosis not present

## 2018-07-04 NOTE — Progress Notes (Signed)
Maternal-Fetal Medicine  Patient had rescue cerclage.   She feels well and does not have fever or abdominal pain or vaginal bleeding.  I explained the procedure performed including the preop finding of dilated cervix. Patient had 2 previous term vaginal deliveries and did not have any cervical surgeries.  I informed her that it is reassuring she did not or does not have clinical signs and symptoms of infection. However, subclinical infection, if present, could not be detected.  Patient is aware that she still has a higher likelihood of PPROM, miscarriage or preterm delivery.  Patient can be discharged today.  An appointment was [facility] 07/10/2018 at 12:45 PM.

## 2018-07-04 NOTE — Discharge Summary (Signed)
Obstetric Discharge Summary Reason for Admission: cervical incompetence Prenatal Procedures: ultrasound Intrapartum Procedures: Cerclage placement Postpartum Procedures: NA, undelivered Complications-Operative and Postpartum: NA, undelivered Hemoglobin  Date Value Ref Range Status  07/03/2018 12.2 12.0 - 15.0 g/dL Final   HCT  Date Value Ref Range Status  07/03/2018 36.5 36.0 - 46.0 % Final    Physical Exam:  General: alert, cooperative and appears stated age  Abdomen: soft, NT   Discharge Diagnoses: Incompetent cervix  Discharge Information: Date: 07/04/2018 Activity: pelvic rest Diet: routine Medications: PNV Condition: improved Instructions: refer to practice specific booklet Discharge to: home Follow-up Information    Carrington Clamp, MD Follow up in 1 week(s).   Specialty:  Obstetrics and Gynecology Why:  Office to call to schedule Contact information: [address] [place] [address] [phone]        Noralee Space, MD Follow up on 07/10/2018.   Specialty:  Obstetrics and Gynecology Why:  @ [phone] information: [address] [place] [address] [phone]            Waynard Reeds 07/04/2018, 9:41 AM

## 2018-07-04 NOTE — Progress Notes (Signed)
Discharge instructions given to patient.  Discussed follow up appointments with Dr Henderson Cloud and Dr Judeth Cornfield. Reviewed discharge instructions. Patient verbalized understanding.

## 2018-07-04 NOTE — Anesthesia Postprocedure Evaluation (Signed)
Anesthesia Post Note  Patient: Kayla [name]  Procedure(s) Performed: CERCLAGE CERVICAL (N/A )     Patient location during evaluation: Mother Baby Anesthesia Type: Spinal Level of consciousness: awake and alert and oriented Pain management: satisfactory to patient Vital Signs Assessment: post-procedure vital signs reviewed and stable Respiratory status: respiratory function stable and spontaneous breathing Cardiovascular status: blood pressure returned to baseline Postop Assessment: no headache, no backache, spinal receding, patient able to bend at knees and adequate PO intake Anesthetic complications: no    Last Vitals:  Vitals:   07/04/18 0119 07/04/18 0357  BP: 119/67 (!) 124/51  Pulse: 96 94  Resp: 18 20  Temp: 37.2 C 36.9 C  SpO2: 100% 100%    Last Pain:  Vitals:   07/04/18 0514  TempSrc:   PainSc: Asleep   Pain Goal: Patients Stated Pain Goal: 3 (07/04/18 0126)                 Karleen Dolphin

## 2018-07-04 NOTE — Addendum Note (Signed)
Addendum  created 07/04/18 0746 by Graciela Husbands, CRNA   Clinical Note Signed

## 2018-07-05 LAB — CULTURE, BETA STREP (GROUP B ONLY)

## 2018-07-06 ENCOUNTER — Encounter (HOSPITAL_COMMUNITY): Payer: Self-pay | Admitting: Obstetrics and Gynecology

## 2018-07-09 ENCOUNTER — Other Ambulatory Visit (HOSPITAL_COMMUNITY): Payer: Self-pay | Admitting: *Deleted

## 2018-07-09 DIAGNOSIS — O3432 Maternal care for cervical incompetence, second trimester: Secondary | ICD-10-CM

## 2018-07-10 ENCOUNTER — Ambulatory Visit (HOSPITAL_COMMUNITY)
Admission: RE | Admit: 2018-07-10 | Discharge: 2018-07-10 | Disposition: A | Discharge status: Home / Self Care | Payer: Medicaid Other | Source: Ambulatory Visit | Attending: Family Medicine | Admitting: Family Medicine

## 2018-07-10 ENCOUNTER — Ambulatory Visit (HOSPITAL_COMMUNITY): Payer: Medicaid Other

## 2018-07-10 ENCOUNTER — Encounter (HOSPITAL_COMMUNITY): Payer: Self-pay

## 2018-07-10 DIAGNOSIS — O3432 Maternal care for cervical incompetence, second trimester: Secondary | ICD-10-CM | POA: Diagnosis not present

## 2018-07-10 DIAGNOSIS — Z3A19 19 weeks gestation of pregnancy: Secondary | ICD-10-CM

## 2018-07-10 DIAGNOSIS — Z3686 Encounter for antenatal screening for cervical length: Secondary | ICD-10-CM

## 2018-07-10 DIAGNOSIS — O99212 Obesity complicating pregnancy, second trimester: Secondary | ICD-10-CM

## 2018-08-15 ENCOUNTER — Inpatient Hospital Stay (HOSPITAL_COMMUNITY): Payer: Medicaid Other

## 2018-08-15 ENCOUNTER — Other Ambulatory Visit: Payer: Self-pay

## 2018-08-15 ENCOUNTER — Inpatient Hospital Stay (HOSPITAL_COMMUNITY)
Admission: AD | Admit: 2018-08-15 | Discharge: 2018-08-29 | DRG: 831 | Disposition: A | Discharge status: Other Acute Inpatient Hospital | Payer: Medicaid Other | Attending: Obstetrics | Admitting: Obstetrics

## 2018-08-15 DIAGNOSIS — Z3A24 24 weeks gestation of pregnancy: Secondary | ICD-10-CM

## 2018-08-15 DIAGNOSIS — O42912 Preterm premature rupture of membranes, unspecified as to length of time between rupture and onset of labor, second trimester: Principal | ICD-10-CM | POA: Diagnosis present

## 2018-08-15 DIAGNOSIS — O26892 Other specified pregnancy related conditions, second trimester: Secondary | ICD-10-CM | POA: Diagnosis present

## 2018-08-15 DIAGNOSIS — O3432 Maternal care for cervical incompetence, second trimester: Secondary | ICD-10-CM

## 2018-08-15 DIAGNOSIS — O99212 Obesity complicating pregnancy, second trimester: Secondary | ICD-10-CM | POA: Diagnosis not present

## 2018-08-15 DIAGNOSIS — O4290 Premature rupture of membranes, unspecified as to length of time between rupture and onset of labor, unspecified weeks of gestation: Secondary | ICD-10-CM | POA: Diagnosis not present

## 2018-08-15 DIAGNOSIS — O42919 Preterm premature rupture of membranes, unspecified as to length of time between rupture and onset of labor, unspecified trimester: Secondary | ICD-10-CM

## 2018-08-15 DIAGNOSIS — M545 Low back pain: Secondary | ICD-10-CM | POA: Diagnosis present

## 2018-08-15 DIAGNOSIS — O4102X0 Oligohydramnios, second trimester, not applicable or unspecified: Secondary | ICD-10-CM

## 2018-08-15 DIAGNOSIS — O4100X0 Oligohydramnios, unspecified trimester, not applicable or unspecified: Secondary | ICD-10-CM

## 2018-08-15 DIAGNOSIS — Z3A25 25 weeks gestation of pregnancy: Secondary | ICD-10-CM | POA: Diagnosis not present

## 2018-08-15 LAB — CBC
HCT: 34.8 % — ABNORMAL LOW (ref 36.0–46.0)
Hemoglobin: 11.3 g/dL — ABNORMAL LOW (ref 12.0–15.0)
MCH: 28.5 pg (ref 26.0–34.0)
MCHC: 32.5 g/dL (ref 30.0–36.0)
MCV: 87.7 fL (ref 80.0–100.0)
Platelets: 248 10*3/uL (ref 150–400)
RBC: 3.97 MIL/uL (ref 3.87–5.11)
RDW: 13 % (ref 11.5–15.5)
WBC: 16 10*3/uL — ABNORMAL HIGH (ref 4.0–10.5)
nRBC: 0 % (ref 0.0–0.2)

## 2018-08-15 LAB — TYPE AND SCREEN
ABO/RH(D): B POS
Antibody Screen: NEGATIVE

## 2018-08-15 LAB — ABO/RH: ABO/RH(D): B POS

## 2018-08-15 MED ORDER — ACETAMINOPHEN 325 MG PO TABS
650.0000 mg | ORAL_TABLET | ORAL | Status: DC | PRN
  Administered 2018-08-20 – 2018-08-28 (×2): 650 mg via ORAL
  Filled 2018-08-15 (×2): qty 2

## 2018-08-15 MED ORDER — CALCIUM CARBONATE ANTACID 500 MG PO CHEW: 2.0000 | CHEWABLE_TABLET | ORAL | Status: DC | PRN

## 2018-08-15 MED ORDER — MAGNESIUM SULFATE BOLUS VIA INFUSION
6.0000 g | Freq: Once | INTRAVENOUS | Status: AC
  Administered 2018-08-15: 6 g via INTRAVENOUS
  Filled 2018-08-15: qty 500

## 2018-08-15 MED ORDER — LACTATED RINGERS IV SOLN
INTRAVENOUS | Status: DC
  Administered 2018-08-15: 15:00:00 via INTRAVENOUS

## 2018-08-15 MED ORDER — DOCUSATE SODIUM 100 MG PO CAPS
100.0000 mg | ORAL_CAPSULE | Freq: Every day | ORAL | Status: DC
  Administered 2018-08-15 – 2018-08-29 (×14): 100 mg via ORAL
  Filled 2018-08-15 (×14): qty 1

## 2018-08-15 MED ORDER — SODIUM CHLORIDE 0.9 % IV SOLN
500.0000 mg | INTRAVENOUS | Status: AC
  Administered 2018-08-15 – 2018-08-16 (×2): 500 mg via INTRAVENOUS
  Filled 2018-08-15 (×2): qty 500

## 2018-08-15 MED ORDER — MAGNESIUM SULFATE 40 G IN LACTATED RINGERS - SIMPLE
2.0000 g/h | INTRAVENOUS | Status: DC
  Administered 2018-08-15: 2 g/h via INTRAVENOUS
  Filled 2018-08-15: qty 500

## 2018-08-15 MED ORDER — AZITHROMYCIN 250 MG PO TABS
500.0000 mg | ORAL_TABLET | Freq: Every day | ORAL | Status: AC
  Administered 2018-08-17 – 2018-08-21 (×5): 500 mg via ORAL
  Filled 2018-08-15 (×5): qty 2

## 2018-08-15 MED ORDER — ZOLPIDEM TARTRATE 5 MG PO TABS: 5.0000 mg | ORAL_TABLET | Freq: Every evening | ORAL | Status: DC | PRN

## 2018-08-15 MED ORDER — CEFAZOLIN SODIUM-DEXTROSE 1-4 GM/50ML-% IV SOLN
1.0000 g | Freq: Three times a day (TID) | INTRAVENOUS | Status: AC
  Administered 2018-08-15 – 2018-08-17 (×6): 1 g via INTRAVENOUS
  Filled 2018-08-15 (×6): qty 50

## 2018-08-15 MED ORDER — PRENATAL MULTIVITAMIN CH
1.0000 | ORAL_TABLET | Freq: Every day | ORAL | Status: DC
  Administered 2018-08-15 – 2018-08-28 (×14): 1 via ORAL
  Filled 2018-08-15 (×14): qty 1

## 2018-08-15 MED ORDER — CEPHALEXIN 500 MG PO CAPS
500.0000 mg | ORAL_CAPSULE | Freq: Four times a day (QID) | ORAL | Status: AC
  Administered 2018-08-17 – 2018-08-19 (×8): 500 mg via ORAL
  Filled 2018-08-15 (×8): qty 1

## 2018-08-15 MED ORDER — BETAMETHASONE SOD PHOS & ACET 6 (3-3) MG/ML IJ SUSP
12.0000 mg | INTRAMUSCULAR | Status: AC
  Administered 2018-08-15 – 2018-08-16 (×2): 12 mg via INTRAMUSCULAR
  Filled 2018-08-15 (×2): qty 2

## 2018-08-15 NOTE — Progress Notes (Signed)
Dr Chestine Spore at bedside for mag bolus, RN and MD unable to apply external monitors. Fetal heart tones 150 by doppler.

## 2018-08-15 NOTE — H&P (Signed)
28 y.o. [name] presents with PPROM.  She was seen in the office for a routine prenatal visit and ultrasound.  Ultrasound showed oligohydramnios with DVP 1.5cm.  On exam, per Kayla [name], patient with + pool, nitrazine and fern.  She denied any big gush of fluid; however, does report that she has been "leaking urine" since time of cerclage placement.  Describes intermittent leakage of clear fluid, no bleeding, no contractions, no pain.  Did not occur prior to cerclage.  Otherwise has good fetal movement and no bleeding.  Pregnancy c/b: 1. Cervical incompetence--diagnosed at 18 week ultrasound with funneling of membranes to internal os and 3 cm dilation.  She underwent a rescue cerclage on 07/03/2018.   F/u US on 2/13 was reassuring with a cervical length of 2.8 cm.   2.  History of gestational hypertension--on aspirin 81mg   Past Medical History:  Diagnosis Date  . Hernia of abdominal wall 2019  . History of frequent urinary tract infections   . Pregnancy induced hypertension     Past Surgical History:  Procedure Laterality Date  . CERVICAL CERCLAGE N/A 07/03/2018   Procedure: CERCLAGE CERVICAL;  Surgeon: Kayla Clamp, MD;  Location: [facility];  Service: Gynecology;  Laterality: N/A;  . NO PAST SURGERIES      OB History  Gravida Para Term Preterm AB Living  3 2 2  0 0 2  SAB TAB Ectopic Multiple Live Births  0 0 0 0 2    # Outcome Date GA Lbr Len/2nd Weight Sex Delivery Anes PTL Lv  3 Current           2 Term 09/01/15 [redacted]w[redacted]d 05:06 / 00:40 3335 g F Vag-Spont EPI  LIV  1 Term 10/16/10 [redacted]w[redacted]d   M Vag-Spont       Social History   Socioeconomic History  . Marital status: Single    Spouse name: Not on file  . Number of children: 2  . Years of education: Not on file  . Highest education level: Not on file  Occupational History  . Not on file  Social Needs  . Financial resource strain: Not on file  . Food insecurity:    Worry: Not on file    Inability: Not on file   . Transportation needs:    Medical: Not on file    Non-medical: Not on file  Tobacco Use  . Smoking status: Never Smoker  . Smokeless tobacco: Never Used  Substance and Sexual Activity  . Alcohol use: Not Currently  . Drug use: No  . Sexual activity: Yes    Partners: Male    Birth control/protection: None  Lifestyle  . Physical activity:    Days per week: Not on file    Minutes per session: Not on file  . Stress: Not on file  Other Topics Concern  . Not on file  Social History Narrative   Marital status: single; not dating.     Children: 1 son (3yo)      Lives: with mom      Employment: Research officer, political party; moderately happy.      Education: [employer] off [employer].      Tobacco: none      Alcohol: none      Drugs: none      Exercise:  None; active.         Penicillins    Prenatal Transfer Tool  Maternal Diabetes: No Genetic Screening: Normal  FTS low risk Maternal Ultrasounds/Referrals:  Abnormal:  Findings:   Other:  Short cervix as above Fetal Ultrasounds or other Referrals:  Referred to Materal Fetal Medicine  Maternal Substance Abuse:  No Significant Maternal Medications:  Meds include: Other:  aspirin 81mg  Significant Maternal Lab Results: None  ABO, Rh: --/--/B POS (02/06 1648) Antibody: NEG (02/06 1648) Rubella:  [place] RPR:   NR HBsAg:   Neg HIV: Non Reactive (10/29 1015)  GBS:   unknown     Vitals:   08/15/18 1257  BP: 136/75  Pulse: (!) 109  Resp: 18  Temp: 99.7 F (37.6 C)  SpO2: 95%     General:  NAD Abdomen:  soft, gravid.  No fundal tenderness Ex:  no edema SVE:  +pool, + fern per MH.   FHTs:  150s by dopplers and bedside ultrasound, unable to obtain tracing.  Toco:  quiet   A/P   28 y.o. [name] presents with PPROM Admit to [facility] PPROM:  Latency antibiotics, gbs collected and pending.  Unclear of exact duration of ROM at this time.  Patient reports "leaking urine" after cerclage placement.  No  leakage prior.  Normal amniotic fluid volume on 2/13.    WBC mildly elevated on admission at 16 (was 15 at time of cerclage placement), otherwise no signs/ symptoms of intra-amniotic infection.  Prematurity:  BMZ series, magnesium for neuro protection, and will obtain NICU consult Cerclage in situ:  Formal MFM consult pending, discussed with Kayla [name], plan to leave in place Fetal well being: will obtain formal growth .  Have been able to auscultate normal FHTs by doppler and see fetal normal heart rate on bedside ultrasound.  Despite extensive efforts, have been unable to obtain FHR tracing for NST.  For time being, will monitor hourly with doppler and daily w ultrasound.   FSR/ vtx/ GBS unknown  Kayla [name] [employer]

## 2018-08-15 NOTE — Consult Note (Signed)
Maternal-Fetal Medicine Name: Mishka Veerkamp MRN: [id number] Requesting Provider: Marlow Baars, MD  Ms. Hyacinth Meeker, [place] P2 at 24-weeks' gestation, is admitted with the diagnosis of PPROM. She had her routine prenatal visit today and on ultrasound, oligohydramnios was seen. On examination, pooling was seen with positive nitrazine and ferning. Patient gives history of leakage off and on and she attributes to urinary incontinence. She does not have fever or abdominal pain or uterine contractions or vaginal bleeding.  Since admission, the patient received first dose of betamethasone, IV antibiotics and is on magnesium sulfate infusion. She had a consultation with me on 07/03/2018 and I performed a rescue cerclage that day (see note in EPIC). Follow-up ultrasound a week later showed cervical length measurement of 2.3 to 2.8 cm with intact cerclage. P/E: Patient is comfortably lying in bed; not in pain.  BP 114/59, pulse 104, T 99.7C (12:27 PM).  Abd: Soft gravid uterus; no tenderness in any of the quadrants. A limited bedside ultrasound was performed. Oligohydramnios (AFI=3 cm) was seen. Cephalic presentation. Posterior placenta. Umbilical artery Doppler was performed to rule out growth restriction as a cause of oligohydramnios. Umbilical artery Doppler showed normal forward diastolic flow. NST: Difficult to monitor the patient. Labs: WBC 16, Hb 11.3, Hct 34.8, PLT 248, B positive.  PPROM  I counseled the couple on the following: -Explained the diagnosis of PPROM, management and timing of delivery. -Antibiotics increases the latency period (rupture-delivery interval). -About 70% or more patients go into spontaneous labor within 1 week. -If expectant management is successful, we recommend delivery at 34 weeks as risk from intraamniotic infection outweighs the neonatal benefits at that gestational age. Expectant management beyond 34 weeks is possible (not later than 37 weeks), but that will be addressed  closer to 34 weeks. -Steroids improve neonatal outcome. -Complications of PPROM include intraamniotic infections, fetal infection, placental abruption, fetal demise (cord prolapse).   PPROM and Cerclage:  I informed the patient that no clear recommendations exist on the management of patients with PPROM with cerclage in place. One nonrandomized trial showed increase in the latency period (prolongation of pregnancy). It is also possible that risk of sepsis is increased with cerclage in place. ACOG does not have a clear recommendation on the management and both courses (removal or retention) have been adopted by different physicians.  My recommendation would be NOT to remove the cerclage now till 70 weeks' gestation. I also informed the patient that she can also opt to have the cerclage removed now if she perceives the risk of infection is greater.  Patient opted not to have the cerclage removed. She understands that cerclage will be removed if she goes into active labor or has signs of chorioamnionitis.  Recommendations: -Complete IV course of antibiotics followed by oral antibiotics (5 days). -Close monitoring for signs and symptoms of maternal infection. -Fetal growth assessment on Monday if not performed in the last week. -Neonatal consultation. -Discontinue magnesium sulfate (patient is not in labor) and restart if the patient goes into labor (for fetal neuroprotection). -Fetal heart check by Doppler q shift till 25 weeks and reassess for NST.   Thank you for consult. Please do not hesitate to contact me at the [facility] if you have any questions.  Consultation including face-to-face counseling 40 minutes.

## 2018-08-16 NOTE — Progress Notes (Signed)
28 y.o. [id number] [redacted]w[redacted]d HD#2 admitted for PPROM.   Did well overnight.  Denies fevers, chills, abdominal pain, contractions or vaginal bleeding.  +Good FM. S/p MFM consult yesterday. -  Vitals:   08/15/18 2323 08/16/18 0606 08/16/18 0800 08/16/18 1200  BP:  119/61 (!) 121/57 (!) 128/55  Pulse: (!) 104 91 100 (!) 110  Resp: 18 18 18 18   Temp: 98.3 F (36.8 C) 98.5 F (36.9 C) 98.2 F (36.8 C) 98.4 F (36.9 C)  TempSrc: Oral Oral Oral Oral  SpO2: 100% 100% 98% 100%  Weight:      Height:       NAD Abd  Soft, gravid, nontender Ex no edema FHTs  140-145 by doppler    A:  HD#2 at  [redacted]w[redacted]d with PPROM.  P: Continue latency antibiotics Complete BMZ course NICU consult Cerclage--leave in place until 32 weeks.  Remove sooner if s/sx of active labor or infection Growth [place] scheduled 3/23 Fetal monitoring--unable to obtain NST.  Fetal heart tones q shift with doppler until 25 weeks, then reassess for ability to perform NST Vertex / gbs neg [facility] GEFFEL [employer]

## 2018-08-16 NOTE — Consult Note (Signed)
Neonatology Consult to Antenatal Patient:  Dr. Chestine Spore has asked for consultation on [name] with MRN #[id number] regarding the care of a premature infant at 24 4/[redacted] weeks EGA.   Thank you for inviting [place] to see this patient.    Reason for consult:  Explain the possible complications, the prognosis, and the care of her premature infant.   My key findings of this patient's HPI are:  [name] was admitted this week with pregnancy that is complicated by PPROM.  She is a G3P2 mother who is currently not having active labor.  Pregnancy also complicated by PIH on ASA. She has received BMZ, Mag and is on IV Azith and Cefazolin. Cerclage placed 2/6 by MFM.  MFM consulted with her last night; [place] pending, oligo present.  Baby in no apparent distress.  Expectant management has been communicated until hopefully 34w unless fetal distress though mother aware many deliver early.   My recommendations for this patient and my actions included:  1. In the presence of the patient and her husband, I spent 25 minutes discussing the possible complications and outcomes of prematurity at this gestational age. I gave the patient a March of D[employer] handout, written in lay language, that discussed the common complications and survival data of the premature infant and a summary handout with graph and table. I discussed the potential need for resuscitation at birth, mechanical ventilation and surfactant administration for respiratory distress, IV fluids pending establishment of enteral feeds (encouraged breast milk feeding to which she planned to do and discussed use of donor milk if necessary), antibiotics for possible sepsis, temperature support, and monitoring. I also discussed the potential risk of complications such as intracranial hemorrhage, retinopathy, hearing deficit, and chronic lung disease. I also discussed the potential length of stay in the neonatal intensive care unit until ~40 weeks PMA. I discussed this with  parents in detail and they expressed an understanding of the risks and complications of prematurity.   2. I also discussed the expected survival of an infant born at this time, which is near 50%. We further discussed risk of morbidities including severe neurological complications which is high at this time as well as moderate or mild disabilities that manifest as school or behavior issues.  They expressed an understanding of this information.   3. I informed her that the NICU team would be present at the delivery. She agreed that all appropriate medical measures could be taken to resuscitate her infant at the delivery. She also understood that depending on her infant's initial condition and NICU course, some difficult decisions may have to be made.   4. Visitation policy was briefly discussed.   Final Impression:  28yo  female with a 24 4/7 IUP who is threatening to deliver and who now understands the possible complications and prognosis of her infant.     ______________________________________________________________________  Thank you for asking [place] to participate in the care of this patient. Please do not hesitate to contact us again if you are aware of any further ways we can be of assistance.   Sincerely,  Dineen Kid. Leary Roca, MD Neonatologist 08/16/2018, 5:54 PM   The total length of face-to-face or floor/unit time for this encounter was 45 minutes. Counseling and/or coordination of care was 25 minutes of the above.

## 2018-08-17 LAB — CULTURE, BETA STREP (GROUP B ONLY)

## 2018-08-17 NOTE — Progress Notes (Signed)
Pt reports odor in urine that started yesterday (3/21) evening. No itching, burning, or changes to vaginal discharge. Will continue to monitor.  Arva Chafe, RN

## 2018-08-17 NOTE — Progress Notes (Addendum)
28 y.o. I1C3013 at [redacted]w[redacted]d HD#3 admitted for PPROM.   Did well overnight.  Denies fevers, chills, abdominal pain, contractions or vaginal bleeding.  +Good FM. C/o odor to urine since starting antibiotics.  Denies urgency / frequency /change in discharge -  Vitals:   08/16/18 1556 08/16/18 1947 08/17/18 0540 08/17/18 0817  BP: (!) 122/57 130/64 (!) 124/58 129/65  Pulse: 100 92 (!) 103 93  Resp:  18 17 18   Temp:  98.1 F (36.7 C) 98.2 F (36.8 C) 98.4 F (36.9 C)  TempSrc:  Oral Oral Oral  SpO2:  100% 100% 99%  Weight:      Height:       NAD Abd  Soft, gravid, nontender Ex no edema FHTs  140-145 by doppler    A:  HD#3 at  [redacted]w[redacted]d with PPROM.  P: Continue latency antibiotics s/p BMZ course, s/p NICU consult Cerclage--leave in place until 32 weeks.  Remove sooner if s/sx of active labor or infection Growth [place] scheduled 3/23 per MFM Fetal monitoring--unable to obtain NST.  Fetal heart tones q shift for 2-3 minutes with doppler until 25 weeks, then reassess for ability to perform NST Vertex / gbs neg [facility] [employer]

## 2018-08-18 ENCOUNTER — Encounter (HOSPITAL_COMMUNITY): Payer: Self-pay | Admitting: *Deleted

## 2018-08-18 ENCOUNTER — Inpatient Hospital Stay (HOSPITAL_COMMUNITY): Payer: Medicaid Other

## 2018-08-18 DIAGNOSIS — O99212 Obesity complicating pregnancy, second trimester: Secondary | ICD-10-CM

## 2018-08-18 DIAGNOSIS — O42912 Preterm premature rupture of membranes, unspecified as to length of time between rupture and onset of labor, second trimester: Secondary | ICD-10-CM

## 2018-08-18 DIAGNOSIS — O3432 Maternal care for cervical incompetence, second trimester: Secondary | ICD-10-CM

## 2018-08-18 DIAGNOSIS — Z3A24 24 weeks gestation of pregnancy: Secondary | ICD-10-CM

## 2018-08-18 LAB — GC/CHLAMYDIA PROBE AMP (~~LOC~~) NOT AT ARMC
Chlamydia: NEGATIVE
NEISSERIA GONORRHEA: NEGATIVE

## 2018-08-18 LAB — TYPE AND SCREEN
ABO/RH(D): B POS
Antibody Screen: NEGATIVE

## 2018-08-18 MED ORDER — LIDOCAINE-EPINEPHRINE (PF) 2 %-1:200000 IJ SOLN
INTRAMUSCULAR | Status: AC
  Filled 2018-08-18: qty 40

## 2018-08-18 NOTE — Progress Notes (Signed)
HD#4 Pt had u/s with MFM. Anatomy all wnl. There is oligo. She continues to have fluid per vagina. She is still on abxs. Denies any decrease in FM/ctxs/pain/chills. Will attempt to get NST tomorrow.

## 2018-08-19 NOTE — Progress Notes (Signed)
HD#5 PPROM G1P0 25.0 Pt comfortable, denies fever, abd tenderness, no bleeding, denies contractions.  She reports feeling good FM. FHT: pt just put on monitor- 150 mod var, +10x10, no decels TOCO: quite SVE: deferred A/P: 25.0 PPROM S/p MgSO4 for neuroprotection S/p betamethasone x 2 Currently on day 5 latency abx Discussed monitoring with RN, MFM recommended NST, advised RN I prefer continuous monitoring if possible.  She will call with any decels or concerns. Other routine care.

## 2018-08-19 NOTE — Progress Notes (Signed)
Spoke with MFM Dr. Judeth Cornfield about fetal monitoring.  He recommends to stop continuous monitoring and changes to NST qshift.  Order changed and left message via vocera with RN that can maining intermittent monitoring as long as NST shows moderate variability and no decels,call with any NST that has decels.

## 2018-08-20 NOTE — Progress Notes (Signed)
Patient ID: Kayla [name], female   DOB: Feb 28, 1991, 28 y.o.   MRN: [id number]   HD#6 28 yo G3P2002 @ 25+1 with PPROM  S: Feeling well, notes greater volume of LOF today than yesterday. Active FM, No VB. Denies fever or cramping O:  Vitals:   08/19/18 1627 08/19/18 2000 08/19/18 2240 08/20/18 0915  BP: 122/66 124/64 (!) 111/54 118/69  Pulse: 98 91 92 90  Resp: 18  17 16   Temp: 98.8 F (37.1 C) 97.7 F (36.5 C) 97.8 F (36.6 C) 98.4 F (36.9 C)  TempSrc: Oral Oral Oral Oral  SpO2: 99%  99% 99%  Weight:      Height:       AOx3, NAD Abd soft, NT/ND FHR reactive for 25 weeks, cat 1 tracing toco quiet  A/P 28 yo G3P2002 @ 25+1 with cerclage in situ and PPROM 1) S/P BMZ 3/20-3/21 2) Day 6 of latency abxs 3) S/P MgSO4 for neuroprotection 4) Bedrest with BRP 5) NST qshift\ 6) Monitor closely for s/sx infection

## 2018-08-21 LAB — TYPE AND SCREEN
ABO/RH(D): B POS
Antibody Screen: NEGATIVE

## 2018-08-21 NOTE — Progress Notes (Signed)
Patient ID: Kayla [name], female   DOB: 04-Feb-1991, 28 y.o.   MRN: [id number]   HD#7 28 yo G3P2002 @ 25+2 with PPROM  S:Doing well.  Good FM.  Denies abdominal pain, cramping, contractions.  No VB.  Continues to have LOF/discharge from vagina, less today O:  Vitals:   08/20/18 1647 08/20/18 2001 08/21/18 0519 08/21/18 0800  BP: 131/64 137/64 119/69 111/63  Pulse: 90 95 88 (!) 106  Resp:  17  18  Temp:  97.9 F (36.6 C)  98.3 F (36.8 C)  TempSrc:  Oral  Oral  SpO2:  100%  96%  Weight:      Height:       AOx3, NAD Abd soft, NT/ND FHR reactive for 25 weeks, cat 1 tracing toco quiet  A/P 28 yo G3P2002 @ [redacted]w[redacted]d with cerclage in situ and PPROM 1) S/P BMZ 3/20-3/21 2) Day 7 of latency abxs 3) S/P MgSO4 for neuroprotection 4) Bedrest with BRP 5) NST qshift 6) Monitor closely for s/sx infection

## 2018-08-22 NOTE — Progress Notes (Signed)
HD#8  Pt has no c/o. She denies any ctxs/chills/vaginal bleeding/decreased FM VSSAF IMP/ IUP at 25 1/2 wks with PPROM. No evidence of infection or ctxs Plan/ Continue to monitor

## 2018-08-23 NOTE — Progress Notes (Signed)
Initial Nutrition Assessment  DOCUMENTATION CODES:  Obesity unspecified  INTERVENTION:  Regular dieet SnacksTID and double protein portions if requested   NUTRITION DIAGNOSIS:  Increased nutrient needs related to (pregnancy and feteal growth requirements) as evidenced by (25 weeks IUP). GOAL:  Patient will meet greater than or equal to 90% of their needs MONITOR:  Weight trends REASON FOR ASSESSMENT:  Antenatal  ASSESSMENT:  25 4/7 weeks, PROM, cerclage. pre-pregnancy weight same as current. BMI 33.5  Diet Order:   Diet Order            Diet regular Room service appropriate? Yes; Fluid consistency: Thin  Diet effective now             EDUCATION NEEDS: none identified   Skin:  Skin Assessment: Reviewed RN Assessment  Height:   Ht Readings from Last 1 Encounters:  08/15/18 5\' 1"  (1.549 m)   Weight:   Wt Readings from Last 1 Encounters:  08/15/18 79.8 kg   Ideal Body Weight:   105 lbs  BMI:  Body mass index is 33.25 kg/m.  Estimated Nutritional Needs:   Kcal:  1900-2100  Protein:  85-95 g  Fluid:  2.2L    Elisabeth Cara M.Odis Luster LDN Neonatal Nutrition Support Specialist/RD III Pager [phone]      Phone [phone]

## 2018-08-23 NOTE — Progress Notes (Signed)
HD#9 Pt doing well. No changes VSSAF IMP/ IUP at 25+ wks with PPROM         Cerclage in place          No evidence of infection Plan/ Continue with current plan

## 2018-08-24 LAB — TYPE AND SCREEN
ABO/RH(D): B POS
Antibody Screen: NEGATIVE

## 2018-08-24 NOTE — Progress Notes (Signed)
HD#10 Pt being placed on monitor for NST currently. She states that nothing has changed. Good FM VSSAF IMP/ IUP at 26 wks with PPROM Stable Plan/ continue with current plan           Will check with MFM on timing of next u/s

## 2018-08-25 ENCOUNTER — Inpatient Hospital Stay (HOSPITAL_COMMUNITY): Payer: Medicaid Other

## 2018-08-25 DIAGNOSIS — Z3A25 25 weeks gestation of pregnancy: Secondary | ICD-10-CM

## 2018-08-25 DIAGNOSIS — O99212 Obesity complicating pregnancy, second trimester: Secondary | ICD-10-CM

## 2018-08-25 DIAGNOSIS — O4290 Premature rupture of membranes, unspecified as to length of time between rupture and onset of labor, unspecified weeks of gestation: Secondary | ICD-10-CM

## 2018-08-25 DIAGNOSIS — O3432 Maternal care for cervical incompetence, second trimester: Secondary | ICD-10-CM

## 2018-08-25 MED ORDER — SODIUM CHLORIDE 0.9% FLUSH
3.0000 mL | Freq: Two times a day (BID) | INTRAVENOUS | Status: DC
  Administered 2018-08-25 – 2018-08-29 (×8): 3 mL via INTRAVENOUS

## 2018-08-25 NOTE — Progress Notes (Signed)
Patient ID: Kayla [name], female   DOB: 12/19/1990, 28 y.o.   MRN: [id number]   HD#11 28 yo G3P2002 @ 25+6 with PPROM  S:Doing well.  Good FM.  Denies abdominal pain, cramping, contractions.  Mild low back pain. No VB.  Continues to have LOF/discharge from vagina, less today  O: BP 125/68 (BP Location: Right Arm)   Pulse (!) 101   Temp 98.3 F (36.8 C) (Oral)   Resp 17   Ht 5\' 1"  (1.549 m)   Wt 79.8 kg   LMP 02/25/2018   SpO2 100%   BMI 33.25 kg/m     AOx3, NAD Abd soft, no fundal tenderness Back:  No CVA tenderness--mild TTP of Paraspinous muscles in low back FHR: 150s, moderate variability, category 1 and appropriate for gestational age toco quiet  A/P 28 yo G3P2002 @ [redacted]w[redacted]d with cerclage in situ and PPROM 1) S/P BMZ 3/20-3/21 2) s/p latency antibiotics 3) Low back pain--no s/sx labor or infection--suspect MSK.  Heating pad prn 4) Bedrest with BRP 5) NST qshift 6) Monitor closely for s/sx infection 7) Per MFM scan on 3/23---is due for f/u limited US today, will order

## 2018-08-26 NOTE — Plan of Care (Signed)
  Problem: Education: Goal: Knowledge of General Education information will improve Description Including pain rating scale, medication(s)/side effects and non-pharmacologic comfort measures Outcome: Completed/Met

## 2018-08-26 NOTE — Progress Notes (Signed)
28 y.o. [id number] [redacted]w[redacted]d HD#11 admitted for RUPTURE FLUID.  Pt currently stable with no c/o except for back pain.  Good FM.  Vitals:   08/25/18 1716 08/25/18 1946 08/25/18 2247 08/26/18 0617  BP: 117/62 138/60 125/67 (!) 105/55  Pulse: (!) 103 (!) 104 98 100  Resp: 18 18 18 18   Temp: 98.3 F (36.8 C) 98.6 F (37 C) 98.1 F (36.7 C) 98.1 F (36.7 C)  TempSrc: Oral Oral Oral Oral  SpO2: 98% 98% 100% 100%  Weight:      Height:        Lungs CTA Cor RRR Abd  Soft, gravid, nontender Ex SCDs FHTs  Last night, 150s, good short term variability, NST R Toco  none  No results found for this or any previous visit (from the past 24 hour(s)).  A:  HD#11  [redacted]w[redacted]d with PPROM with cerclage in situ   1) S/P BMZ 3/20-3/21 2) s/p latency antibiotics 3) Low back pain--no s/sx labor or infection--suspect MSK.  Heating pad prn 4) Bedrest with BRP 5) NST qshift 6) Monitor closely for s/sx infection 7) MFM [place] limited 03-30 showed VTX fetus with AFI 3.8.  Loney Laurence

## 2018-08-27 LAB — TYPE AND SCREEN
ABO/RH(D): B POS
Antibody Screen: NEGATIVE

## 2018-08-27 MED ORDER — OXYCODONE-ACETAMINOPHEN 5-325 MG PO TABS
1.0000 | ORAL_TABLET | Freq: Once | ORAL | Status: AC
  Administered 2018-08-28: 1 via ORAL
  Filled 2018-08-27: qty 1

## 2018-08-27 MED ORDER — NIFEDIPINE 10 MG PO CAPS
10.0000 mg | ORAL_CAPSULE | Freq: Once | ORAL | Status: AC
  Administered 2018-08-28: 10 mg via ORAL
  Filled 2018-08-27: qty 1

## 2018-08-27 NOTE — Progress Notes (Signed)
HD#12 Pt had an episode of spotting this am. She also noted some increase in pelvic pressure and a change in fetal movement. There were no ctxs seen on monitor. There were no decels noted on NST. No bleeding at this time PLAN/ Will change freq of NST to twice a shift             Pt will call with any changes

## 2018-08-28 LAB — URINALYSIS, ROUTINE W REFLEX MICROSCOPIC
Bilirubin Urine: NEGATIVE
Glucose, UA: NEGATIVE mg/dL
Hgb urine dipstick: NEGATIVE
Ketones, ur: NEGATIVE mg/dL
Leukocytes,Ua: NEGATIVE
Nitrite: NEGATIVE
Protein, ur: NEGATIVE mg/dL
Specific Gravity, Urine: 1.013 (ref 1.005–1.030)
pH: 7 (ref 5.0–8.0)

## 2018-08-28 MED ORDER — ACETAMINOPHEN 500 MG PO TABS
1000.0000 mg | ORAL_TABLET | ORAL | Status: DC | PRN
  Administered 2018-08-28: 1000 mg via ORAL
  Filled 2018-08-28: qty 2

## 2018-08-28 MED ORDER — NITROFURANTOIN MONOHYD MACRO 100 MG PO CAPS
100.0000 mg | ORAL_CAPSULE | Freq: Two times a day (BID) | ORAL | Status: DC
  Administered 2018-08-28 (×2): 100 mg via ORAL
  Filled 2018-08-28 (×3): qty 1

## 2018-08-28 MED ORDER — PHENAZOPYRIDINE HCL 200 MG PO TABS
200.0000 mg | ORAL_TABLET | Freq: Three times a day (TID) | ORAL | Status: DC
  Administered 2018-08-28 – 2018-08-29 (×4): 200 mg via ORAL
  Filled 2018-08-28 (×5): qty 1

## 2018-08-28 MED ORDER — LACTATED RINGERS IV SOLN
INTRAVENOUS | Status: DC
  Administered 2018-08-28 – 2018-08-29 (×2): via INTRAVENOUS

## 2018-08-28 NOTE — Progress Notes (Signed)
Patient ID: Kayla [name], female   DOB: [birth year], 28 y.o.   MRN: [id number]   HD#[id number] 28 yo G3P2002 @ [redacted]w[redacted]d with PPROM  S: patient complained of cramping overnight.  Reports that it is intermittent every 25-30 minutes.  Continues to have pink tinged spotting  O: BP 126/67 (BP Location: Right Arm)   Pulse (!) 109   Temp 98.6 F (37 C) (Oral)   Resp 18   Ht 5\' 1"  (1.549 m)   Wt 79.8 kg   LMP 02/25/2018   SpO2 99%   BMI 33.25 kg/m     AOx3, NAD Abd soft, mild TTP over lower abdomen Pelvic: no blood, pad clear FHR: 150s, moderate variability, no decels, category 1 and appropriate for gestational age toco quiet  Vertex on BSUS this AM  A/P 28 yo G3P2002 @ [redacted]w[redacted]d with cerclage in situ and PPROM 1) S/P BMZ 3/20-3/21 2) s/p latency antibiotics 3) Bedrest with BRP 4) Cramping--is afebrile, fetal heart rate remains stable and not tachycardic.  Patient's pulse continues to run 90s-110s, as has been the case since admission.  Will monitor cramping closely as this could be an early sign of developing infection or labor.  Cervical check held as patient appears comfortable at this time

## 2018-08-28 NOTE — Progress Notes (Signed)
Patient seen and examined.  Complains of continued mild lower abdominal cramping q15-30 minutes, lasting for 15-20 seconds. Denies fevers, chills, nausea, vomiting.   BP 119/60 (BP Location: Left Arm)   Pulse (!) 101   Temp 98.1 F (36.7 C) (Oral)   Resp 16   Ht 5\' 1"  (1.549 m)   Wt 79.8 kg   LMP 02/25/2018   SpO2 99%   BMI 33.25 kg/m  NAD SVE:  Closed / 50/  stitch in tact, presenting part not low in pelvis  Toco: rare contraction EFM;  Category 1, baseline 145-150, moderate variability  A/P:  g3p2 @ [redacted]w[redacted]d w PPROM Will continue to monitor closely, but remains afebrile with reassuring fetal status.  With infrequent cramping and closed cervix, will leave cerclage in situ at this time.

## 2018-08-28 NOTE — Progress Notes (Signed)
TC from RN.  Pt c/o UTI symptoms--burning with urination, frequency, cloudy urine.  Will get cath UA/UCx and start empiric abx once samples collected

## 2018-08-29 LAB — URINE CULTURE: Culture: NO GROWTH

## 2018-08-29 MED ORDER — MAGNESIUM SULFATE BOLUS VIA INFUSION
4.0000 g | Freq: Once | INTRAVENOUS | Status: AC
  Administered 2018-08-29: 4 g via INTRAVENOUS

## 2018-08-29 MED ORDER — MAGNESIUM SULFATE 40 G IN LACTATED RINGERS - SIMPLE: 2.0000 g/h | INTRAVENOUS | Status: DC

## 2018-08-29 NOTE — Discharge Summary (Signed)
Pt was admitted 08/15/2018 with PPROM.  She received an MFM consult and NICU consult.  Recommendations by MFM were to leave in cerclage unless signs of labor.  Pt received a course of latency antibiotics and a course of steroids and was being monitored with NST qshift per MFM instruction.  She remained afebrile with infrequent contractions until 08/29/2018 early am. She reported increased contractions and was assessed by physician.  Cervix was closed and initially contractions were q30 mins.  By 08/29/2018 later morning she was timing contractions q 5-10 min and reporting them to be painful.  Plan was to remove cerclage and move pt to L&D.  Unfortunately NICU was contacted and stated they would not be able to accommodate this 26 week baby.  Recommendation to transfer was made.  [facility] was contacted and they accepted the transfer.  Pt remained stable for transfer without bleeding and appropriate NST for GA and oligo.  Bedside US had been performed and baby was vertex.

## 2018-08-29 NOTE — Progress Notes (Signed)
Pt reports waking up to painful contractions, she has been timing them this morning and they occur every 5- 10 min and she breathes through them.  No fever, abdominal tenderness.  No CP/SOB.  Discussed with Dr. Grace Bushy with MFM and he recommends given that contractions are occurring every 5-10 min and are painful to go ahead and remove cerclage.  Will also restart MgSO4 for neuroprotection.  If contractions dissipate, can d/c MgSO4.  Will transfer to L&D, and will verify position by bedside US.  Asked RN to restart FHT tracing.  Addendum: alerted the NICU is full, spoke with NICU staff who consulted with their physicians and they recommend transfer of patient.  Will work to coordinate transfer, will keep cerclage in place and advise them to remove upon pt arrival.  Pt is not having any bleeding.

## 2018-09-08 MED ORDER — DOCUSATE SODIUM 100 MG PO CAPS
100.00 | ORAL_CAPSULE | ORAL | Status: DC
Start: ? — End: 2018-09-08

## 2018-09-08 MED ORDER — MAGNESIUM HYDROXIDE 400 MG/5ML PO SUSP
30.00 | ORAL | Status: DC
Start: ? — End: 2018-09-08

## 2018-09-08 MED ORDER — VARICELLA VIRUS VACCINE LIVE 1350 PFU/0.5ML ~~LOC~~ INJ
.50 | INJECTION | SUBCUTANEOUS | Status: DC
Start: ? — End: 2018-09-08

## 2018-09-08 MED ORDER — DIPHENHYDRAMINE HCL 25 MG PO CAPS
25.00 | ORAL_CAPSULE | ORAL | Status: DC
Start: ? — End: 2018-09-08

## 2018-09-08 MED ORDER — PRENATAL 27-0.8 MG PO TABS
1.00 | ORAL_TABLET | ORAL | Status: DC
Start: 2018-09-09 — End: 2018-09-08

## 2018-09-08 MED ORDER — BISACODYL 10 MG RE SUPP
10.00 | RECTAL | Status: DC
Start: ? — End: 2018-09-08

## 2018-09-08 MED ORDER — TETANUS-DIPHTH-ACELL PERTUSSIS 5-2.5-18.5 LF-MCG/0.5 IM SUSP
.50 | INTRAMUSCULAR | Status: DC
Start: ? — End: 2018-09-08

## 2018-09-08 MED ORDER — ENEMA 7-19 GM/118ML RE ENEM
1.00 | ENEMA | RECTAL | Status: DC
Start: ? — End: 2018-09-08

## 2018-09-08 MED ORDER — BENZOCAINE-MENTHOL 20-0.5 % EX AERO
1.00 | INHALATION_SPRAY | CUTANEOUS | Status: DC
Start: ? — End: 2018-09-08

## 2018-09-08 MED ORDER — GENERIC EXTERNAL MEDICATION
600.00 | Status: DC
Start: 2018-09-08 — End: 2018-09-08

## 2018-09-08 MED ORDER — SIMETHICONE 80 MG PO CHEW
80.00 | CHEWABLE_TABLET | ORAL | Status: DC
Start: ? — End: 2018-09-08

## 2018-09-08 MED ORDER — MEASLES, MUMPS & RUBELLA VAC IJ SOLR
.50 | INTRAMUSCULAR | Status: DC
Start: ? — End: 2018-09-08

## 2018-09-08 MED ORDER — SODIUM CHLORIDE FLUSH 0.9 % IV SOLN
10.00 | INTRAVENOUS | Status: DC
Start: 2018-09-08 — End: 2018-09-08

## 2018-09-08 MED ORDER — GENERIC EXTERNAL MEDICATION
Status: DC
Start: ? — End: 2018-09-08

## 2018-09-08 MED ORDER — WITCH HAZEL-GLYCERIN EX PADS
1.00 | MEDICATED_PAD | CUTANEOUS | Status: DC
Start: ? — End: 2018-09-08

## 2018-09-08 MED ORDER — SODIUM CHLORIDE FLUSH 0.9 % IV SOLN
10.00 | INTRAVENOUS | Status: DC
Start: ? — End: 2018-09-08

## 2018-09-08 MED ORDER — ACETAMINOPHEN 325 MG PO TABS
650.00 | ORAL_TABLET | ORAL | Status: DC
Start: ? — End: 2018-09-08

## 2018-09-08 MED ORDER — ALUMINUM-MAGNESIUM-SIMETHICONE 200-200-20 MG/5ML PO SUSP
30.00 | ORAL | Status: DC
Start: ? — End: 2018-09-08

## 2019-02-24 ENCOUNTER — Encounter (HOSPITAL_COMMUNITY): Payer: Self-pay

## 2019-05-17 ENCOUNTER — Encounter (HOSPITAL_COMMUNITY): Payer: Self-pay | Admitting: Emergency Medicine

## 2019-05-17 ENCOUNTER — Other Ambulatory Visit: Payer: Self-pay

## 2019-05-17 ENCOUNTER — Emergency Department (HOSPITAL_COMMUNITY)
Admission: EM | Admit: 2019-05-17 | Discharge: 2019-05-17 | Disposition: A | Discharge status: Home / Self Care | Payer: Medicaid Other | Attending: Emergency Medicine | Admitting: Emergency Medicine

## 2019-05-17 ENCOUNTER — Emergency Department (HOSPITAL_COMMUNITY): Payer: Medicaid Other

## 2019-05-17 DIAGNOSIS — Z5321 Procedure and treatment not carried out due to patient leaving prior to being seen by health care provider: Secondary | ICD-10-CM | POA: Insufficient documentation

## 2019-05-17 DIAGNOSIS — R0789 Other chest pain: Secondary | ICD-10-CM | POA: Insufficient documentation

## 2019-05-17 LAB — CBC
HCT: 44.1 % (ref 36.0–46.0)
Hemoglobin: 14.5 g/dL (ref 12.0–15.0)
MCH: 28.4 pg (ref 26.0–34.0)
MCHC: 32.9 g/dL (ref 30.0–36.0)
MCV: 86.3 fL (ref 80.0–100.0)
Platelets: 376 10*3/uL (ref 150–400)
RBC: 5.11 MIL/uL (ref 3.87–5.11)
RDW: 12.8 % (ref 11.5–15.5)
WBC: 11 10*3/uL — ABNORMAL HIGH (ref 4.0–10.5)
nRBC: 0 % (ref 0.0–0.2)

## 2019-05-17 LAB — TROPONIN I (HIGH SENSITIVITY)
Troponin I (High Sensitivity): 4 ng/L (ref <18)
Troponin I (High Sensitivity): 3 ng/L (ref <18)

## 2019-05-17 LAB — BASIC METABOLIC PANEL
Anion gap: 8 (ref 5–15)
BUN: 9 mg/dL (ref 6–20)
CO2: 26 mmol/L (ref 22–32)
Calcium: 9.1 mg/dL (ref 8.9–10.3)
Chloride: 105 mmol/L (ref 98–111)
Creatinine, Ser: 0.76 mg/dL (ref 0.44–1.00)
GFR calc Af Amer: >60 mL/min (ref >60)
GFR calc non Af Amer: >60 mL/min (ref >60)
Glucose, Bld: 138 mg/dL — ABNORMAL HIGH (ref 70–99)
Potassium: 3.9 mmol/L (ref 3.5–5.1)
Sodium: 139 mmol/L (ref 135–145)

## 2019-05-17 LAB — I-STAT BETA HCG BLOOD, ED (MC, WL, AP ONLY): I-stat hCG, quantitative: <5 m[IU]/mL (ref <5)

## 2019-05-17 MED ORDER — SODIUM CHLORIDE 0.9% FLUSH: 3.0000 mL | Freq: Once | INTRAVENOUS | Status: DC

## 2019-05-17 NOTE — ED Triage Notes (Signed)
C/o R sided chest pain that radiates to R side of neck and throbbing pain down R arm since yesterday.  Denies SOB, nausea, and vomiting;

## 2019-05-17 NOTE — ED Notes (Signed)
Pt left AMA. Pt is upset and stated she has waited too long and isn't waiting anymore.

## 2019-05-18 ENCOUNTER — Encounter: Payer: Self-pay | Admitting: Family Medicine

## 2019-05-18 ENCOUNTER — Other Ambulatory Visit: Payer: Self-pay

## 2019-05-18 ENCOUNTER — Ambulatory Visit (INDEPENDENT_AMBULATORY_CARE_PROVIDER_SITE_OTHER): Payer: Medicaid Other | Admitting: Family Medicine

## 2019-05-18 VITALS — BP 130/82 | HR 70 | Temp 98.3°F | Resp 17 | Ht 61.0 in | Wt 180.0 lb

## 2019-05-18 DIAGNOSIS — M545 Low back pain, unspecified: Secondary | ICD-10-CM

## 2019-05-18 DIAGNOSIS — D72829 Elevated white blood cell count, unspecified: Secondary | ICD-10-CM | POA: Diagnosis not present

## 2019-05-18 DIAGNOSIS — R109 Unspecified abdominal pain: Secondary | ICD-10-CM

## 2019-05-18 LAB — POCT URINALYSIS DIP (MANUAL ENTRY)
Bilirubin, UA: NEGATIVE
Blood, UA: NEGATIVE
Glucose, UA: NEGATIVE mg/dL
Ketones, POC UA: NEGATIVE mg/dL
Nitrite, UA: NEGATIVE
Protein Ur, POC: NEGATIVE mg/dL
Spec Grav, UA: 1.025 (ref 1.010–1.025)
Urobilinogen, UA: 0.2 E.U./dL
pH, UA: 6.5 (ref 5.0–8.0)

## 2019-05-18 LAB — POCT CBC
Granulocyte percent: 67.2 %G (ref 37–80)
HCT, POC: 41.7 % — AB (ref 29–41)
Hemoglobin: 14.3 g/dL (ref 11–14.6)
Lymph, poc: 3.4 (ref 0.6–3.4)
MCH, POC: 28.5 pg (ref 27–31.2)
MCHC: 34.4 g/dL (ref 31.8–35.4)
MCV: 82.8 fL (ref 76–111)
MID (cbc): 0.4 (ref 0–0.9)
MPV: 6.9 fL (ref 0–99.8)
POC Granulocyte: 7.9 — AB (ref 2–6.9)
POC LYMPH PERCENT: 29.3 %L (ref 10–50)
POC MID %: 3.5 %M (ref 0–12)
Platelet Count, POC: 382 10*3/uL (ref 142–424)
RBC: 5.04 M/uL (ref 4.04–5.48)
RDW, POC: 13.4 %
WBC: 11.7 10*3/uL — AB (ref 4.6–10.2)

## 2019-05-18 MED ORDER — IBUPROFEN 600 MG PO TABS: 600.0000 mg | ORAL_TABLET | Freq: Three times a day (TID) | ORAL | 0 refills | Status: DC | PRN

## 2019-05-18 NOTE — Progress Notes (Signed)
Established Patient Office Visit  Subjective:  Patient ID: Kayla [name], female    DOB: [birth year]  Age: 28 y.o. MRN: [id number]  CC:  Chief Complaint  Patient presents with  . constipation/abdominal pain    HPI Kayla [name] presents for   Patient's last menstrual period was 05/11/2019. She reports that she has normal BM almost daily She denies any injury or falls She states that the pain is 8/10 when present  The pain has been there for 3 weeks The pain moves like a wave then resolves.  She tried tylenol which did not help. No vaginal discharge Reports that she occasionally gets pain during intercourse.    Past Medical History:  Diagnosis Date  . Hernia of abdominal wall 2019  . History of frequent urinary tract infections   . Pregnancy induced hypertension     Past Surgical History:  Procedure Laterality Date  . CERVICAL CERCLAGE N/A 07/03/2018   Procedure: CERCLAGE CERVICAL;  Surgeon: Carrington Clamp, MD;  Location: [facility] BIRTHING SUIT[facility];  Service: Gynecology;  Laterality: N/A;  . NO PAST SURGERIES      Family History  Problem Relation Age of Onset  . Heart disease Father 67       AMI  . Cancer Neg Hx   . Diabetes Neg Hx   . Stroke Neg Hx     Social History   Socioeconomic History  . Marital status: Single    Spouse name: Not on file  . Number of children: 2  . Years of education: Not on file  . Highest education level: Not on file  Occupational History  . Not on file  Tobacco Use  . Smoking status: Never Smoker  . Smokeless tobacco: Never Used  Substance and Sexual Activity  . Alcohol use: Not Currently  . Drug use: No  . Sexual activity: Yes    Partners: Male    Birth control/protection: None  Other Topics Concern  . Not on file  Social History Narrative   Marital status: single; not dating.     Children: 1 son (3yo)      Lives: with mom      Employment: Research officer, political party; moderately happy.       Education: [employer] off [employer].      Tobacco: none      Alcohol: none      Drugs: none      Exercise:  None; active.         Social Determinants of Health   Financial Resource Strain:   . Difficulty of Paying Living Expenses: Not on file  Food Insecurity:   . Worried About Programme researcher, broadcasting/film/video in the Last Year: Not on file  . Ran Out of Food in the Last Year: Not on file  Transportation Needs:   . Lack of Transportation (Medical): Not on file  . Lack of Transportation (Non-Medical): Not on file  Physical Activity:   . Days of Exercise per Week: Not on file  . Minutes of Exercise per Session: Not on file  Stress:   . Feeling of Stress : Not on file  Social Connections:   . Frequency of Communication with Friends and Family: Not on file  . Frequency of Social Gatherings with Friends and Family: Not on file  . Attends Religious Services: Not on file  . Active Member of Clubs or Organizations: Not on file  . Attends Banker Meetings: Not on file  . Marital  Status: Not on file  Intimate Partner Violence:   . Fear of Current or Ex-Partner: Not on file  . Emotionally Abused: Not on file  . Physically Abused: Not on file  . Sexually Abused: Not on file    No outpatient medications prior to visit.   No facility-administered medications prior to visit.    Allergies  Allergen Reactions  . Penicillins Itching and Rash    Has patient had a PCN reaction causing immediate rash, facial/tongue/throat swelling, SOB or lightheadedness with hypotension: No Has patient had a PCN reaction causing severe rash involving mucus membranes or skin necrosis: No Has patient had a PCN reaction that required hospitalization: No Has patient had a PCN reaction occurring within the last 10 years: No If all of the above answers are "NO", then may proceed with Cephalosporin use.     ROS Review of Systems Review of Systems  Constitutional: Negative for activity change, appetite  change, chills and fever.  HENT: Negative for congestion, nosebleeds, trouble swallowing and voice change.   Respiratory: Negative for cough, shortness of breath and wheezing.   Gastrointestinal: Negative for diarrhea, nausea and vomiting.  Genitourinary: Negative for difficulty urinating, dysuria, flank pain and hematuria.  Musculoskeletal: Negative for back pain, joint swelling and neck pain.  Neurological: Negative for dizziness, speech difficulty, light-headedness and numbness.  See HPI. All other review of systems negative.     Objective:    Physical Exam  BP 130/82 (BP Location: Left Arm, Patient Position: Sitting, Cuff Size: Large)   Pulse 70   Temp 98.3 F (36.8 C) (Oral)   Resp 17   Ht 5\' 1"  (1.549 m)   Wt 180 lb (81.6 kg)   LMP 05/11/2019   SpO2 99%   BMI 34.01 kg/m  Wt Readings from Last 3 Encounters:  05/18/19 180 lb (81.6 kg)  05/17/19 171 lb (77.6 kg)  08/15/18 176 lb (79.8 kg)   Physical Exam  Constitutional: Oriented to person, place, and time. Appears well-developed and well-nourished.  HENT:  Head: Normocephalic and atraumatic.  Eyes: Conjunctivae and EOM are normal.  Cardiovascular: Normal rate, regular rhythm, normal heart sounds and intact distal pulses.  No murmur heard. Pulmonary/Chest: Effort normal and breath sounds normal. No stridor. No respiratory distress. Has no wheezes.  Abdomen: nondistended, normoactive bs, soft, tenderness of the flank on left Neurological: Is alert and oriented to person, place, and time.  Skin: Skin is warm. Capillary refill takes less than 2 seconds.  Psychiatric: Has a normal mood and affect. Behavior is normal. Judgment and thought content normal.    Health Maintenance Due  Topic Date Due  . PAP-Cervical Cytology Screening  01/13/2018  . PAP SMEAR-Modifier  01/13/2018  . INFLUENZA VACCINE  12/27/2018    There are no preventive care reminders to display for this patient.  Lab Results  Component Value Date    TSH 1.861 02/17/2014   Lab Results  Component Value Date   WBC 11.7 (A) 05/18/2019   HGB 14.3 05/18/2019   HCT 41.7 (A) 05/18/2019   MCV 82.8 05/18/2019   PLT 376 05/17/2019   Lab Results  Component Value Date   NA 139 05/17/2019   K 3.9 05/17/2019   CO2 26 05/17/2019   GLUCOSE 138 (H) 05/17/2019   BUN 9 05/17/2019   CREATININE 0.76 05/17/2019   BILITOT 0.6 11/02/2017   ALKPHOS 62 11/02/2017   AST 16 11/02/2017   ALT 10 (L) 11/02/2017   PROT 6.7 11/02/2017   ALBUMIN  3.8 11/02/2017   CALCIUM 9.1 05/17/2019   ANIONGAP 8 05/17/2019   No results found for: CHOL No results found for: HDL No results found for: LDLCALC No results found for: TRIG No results found for: CHOLHDL No results found for: [id number]    Assessment & Plan:   Problem List Items Addressed This Visit    None    Visit Diagnoses    Acute bilateral low back pain without sciatica    -  Primary Discussed back pain    Relevant Medications   ibuprofen (ADVIL) 600 MG tablet   Other Relevant Orders   POCT urine dipstick (Completed)   Acute left flank pain    - could be UTI, or muscular strain   Relevant Orders   POCT CBC (Completed)   Urine culture (Completed)   Leukocytosis, unspecified type    - elevated so will send urine culture   Relevant Orders   POCT CBC (Completed)   Urine culture (Completed)      Meds ordered this encounter  Medications  . ibuprofen (ADVIL) 600 MG tablet    Sig: Take 1 tablet (600 mg total) by mouth every 8 (eight) hours as needed.    Dispense:  30 tablet    Refill:  0    Follow-up: No follow-ups on file.    A total of 25 minutes were spent face-to-face with the patient during this encounter and over half of that time was spent on counseling and coordination of care.   Doristine BosworthZoe A Zay Yeargan, MD

## 2019-05-18 NOTE — Patient Instructions (Signed)
° ° ° °  If you have lab work done today you will be contacted with your lab results within the next 2 weeks.  If you have not heard from us then please contact us. The fastest way to get your results is to register for My Chart. ° ° °IF you received an x-ray today, you will receive an invoice from [facility]. Please contact [facility] at [phone] with questions or concerns regarding your invoice.  ° °IF you received labwork today, you will receive an invoice from [facility]. Please contact [facility] at [phone] with questions or concerns regarding your invoice.  ° °Our billing staff will not be able to assist you with questions regarding bills from these companies. ° °You will be contacted with the lab results as soon as they are available. The fastest way to get your results is to activate your My Chart account. Instructions are located on the last page of this paperwork. If you have not heard from us regarding the results in 2 weeks, please contact this office. °  ° ° ° °

## 2019-05-20 LAB — URINE CULTURE

## 2020-02-03 ENCOUNTER — Encounter (HOSPITAL_COMMUNITY): Payer: Self-pay | Admitting: Obstetrics and Gynecology

## 2020-02-03 ENCOUNTER — Inpatient Hospital Stay (HOSPITAL_COMMUNITY): Payer: Medicaid Other

## 2020-02-03 ENCOUNTER — Other Ambulatory Visit: Payer: Self-pay

## 2020-02-03 ENCOUNTER — Inpatient Hospital Stay (HOSPITAL_COMMUNITY)
Admission: AD | Admit: 2020-02-03 | Discharge: 2020-02-03 | Disposition: A | Discharge status: Home / Self Care | Payer: Medicaid Other | Attending: Obstetrics and Gynecology | Admitting: Obstetrics and Gynecology

## 2020-02-03 DIAGNOSIS — O26899 Other specified pregnancy related conditions, unspecified trimester: Secondary | ICD-10-CM

## 2020-02-03 DIAGNOSIS — O26891 Other specified pregnancy related conditions, first trimester: Secondary | ICD-10-CM | POA: Insufficient documentation

## 2020-02-03 DIAGNOSIS — R1030 Lower abdominal pain, unspecified: Secondary | ICD-10-CM | POA: Insufficient documentation

## 2020-02-03 DIAGNOSIS — O10919 Unspecified pre-existing hypertension complicating pregnancy, unspecified trimester: Secondary | ICD-10-CM

## 2020-02-03 DIAGNOSIS — O10911 Unspecified pre-existing hypertension complicating pregnancy, first trimester: Secondary | ICD-10-CM | POA: Insufficient documentation

## 2020-02-03 DIAGNOSIS — Z3A08 8 weeks gestation of pregnancy: Secondary | ICD-10-CM | POA: Insufficient documentation

## 2020-02-03 DIAGNOSIS — R110 Nausea: Secondary | ICD-10-CM | POA: Insufficient documentation

## 2020-02-03 DIAGNOSIS — R109 Unspecified abdominal pain: Secondary | ICD-10-CM | POA: Diagnosis not present

## 2020-02-03 LAB — URINALYSIS, ROUTINE W REFLEX MICROSCOPIC
Bilirubin Urine: NEGATIVE
Glucose, UA: NEGATIVE mg/dL
Hgb urine dipstick: NEGATIVE
Ketones, ur: NEGATIVE mg/dL
Leukocytes,Ua: NEGATIVE
Nitrite: NEGATIVE
Protein, ur: NEGATIVE mg/dL
Specific Gravity, Urine: 1.008 (ref 1.005–1.030)
pH: 6 (ref 5.0–8.0)

## 2020-02-03 LAB — CBC
HCT: 40.2 % (ref 36.0–46.0)
Hemoglobin: 13.1 g/dL (ref 12.0–15.0)
MCH: 28.1 pg (ref 26.0–34.0)
MCHC: 32.6 g/dL (ref 30.0–36.0)
MCV: 86.3 fL (ref 80.0–100.0)
Platelets: 335 10*3/uL (ref 150–400)
RBC: 4.66 MIL/uL (ref 3.87–5.11)
RDW: 13.1 % (ref 11.5–15.5)
WBC: 13.9 10*3/uL — ABNORMAL HIGH (ref 4.0–10.5)
nRBC: 0 % (ref 0.0–0.2)

## 2020-02-03 LAB — WET PREP, GENITAL
Sperm: NONE SEEN
Trich, Wet Prep: NONE SEEN
Yeast Wet Prep HPF POC: NONE SEEN

## 2020-02-03 LAB — OB RESULTS CONSOLE GC/CHLAMYDIA: Gonorrhea: NEGATIVE

## 2020-02-03 LAB — POCT PREGNANCY, URINE: Preg Test, Ur: POSITIVE — AB

## 2020-02-03 LAB — HCG, QUANTITATIVE, PREGNANCY: hCG, Beta Chain, Quant, S: 77841 m[IU]/mL — ABNORMAL HIGH (ref <5)

## 2020-02-03 NOTE — MAU Note (Addendum)
Just been having a lot of really bad cramping past 4 days, feels like menstrual cramps.  Pt is preg, +HPT. Has not been seen in office, appt is 9/14.  Had some spotting the 3rd wk in Aug.

## 2020-02-03 NOTE — Discharge Instructions (Signed)
Abdominal Pain During Pregnancy  Belly (abdominal) pain is common during pregnancy. There are many possible causes. Most of the time, it is not a serious problem. Other times, it can be a sign that something is wrong with the pregnancy. Always tell your doctor if you have belly pain. Follow these instructions at home:  Do not have sex or put anything in your vagina until your pain goes away completely.  Get plenty of rest until your pain gets better.  Drink enough fluid to keep your pee (urine) pale yellow.  Take over-the-counter and prescription medicines only as told by your doctor.  Keep all follow-up visits as told by your doctor. This is important. Contact a doctor if:  Your pain continues or gets worse after resting.  You have lower belly pain that: ? Comes and goes at regular times. ? Spreads to your back. ? Feels like menstrual cramps.  You have pain or burning when you pee (urinate). Get help right away if:  You have a fever or chills.  You have vaginal bleeding.  You are leaking fluid from your vagina.  You are passing tissue from your vagina.  You throw up (vomit) for more than 24 hours.  You have watery poop (diarrhea) for more than 24 hours.  Your baby is moving less than usual.  You feel very weak or faint.  You have shortness of breath.  You have very bad pain in your upper belly. Summary  Belly (abdominal) pain is common during pregnancy. There are many possible causes.  If you have belly pain during pregnancy, tell your doctor right away.  Keep all follow-up visits as told by your doctor. This is important. This information is not intended to replace advice given to you by your health care provider. Make sure you discuss any questions you have with your health care provider. Document Revised: 09/01/2018 Document Reviewed: 08/16/2016 Elsevier Patient Education  2020 Else[employer] Inc[employer]   Hypertension During Pregnancy Hypertension is also called  high blood pressure. High blood pressure means that the force of your blood moving in your body is too strong. It can cause problems for you and your baby. Different types of high blood pressure can happen during pregnancy. The types are:  High blood pressure before you got pregnant. This is called chronic hypertension.  This can continue during your pregnancy. Your doctor will want to keep checking your blood pressure. You may need medicine to keep your blood pressure under control while you are pregnant. You will need follow-up visits after you have your baby.  High blood pressure that goes up during pregnancy when it was normal before. This is called gestational hypertension. It will usually get better after you have your baby, but your doctor will need to watch your blood pressure to make sure that it is getting better.  Very high blood pressure during pregnancy. This is called preeclampsia. Very high blood pressure is an emergency that needs to be checked and treated right away.  You may develop very high blood pressure after giving birth. This is called postpartum preeclampsia. This usually occurs within 48 hours after childbirth but may occur up to 6 weeks after giving birth. This is rare. How does this affect me? If you have high blood pressure during pregnancy, you have a higher chance of developing high blood pressure:  As you get older.  If you get pregnant again. In some cases, high blood pressure during pregnancy can cause:  Stroke.  Heart attack.  Damage to the kidneys, lungs, or liver.  Preeclampsia.  Jerky movements you cannot control (convulsions or seizures).  Problems with the placenta. How does this affect my baby? Your baby may:  Be born early.  Not weigh as much as he or she should.  Not handle labor well, leading to a c-section birth. What are the risks?  Having high blood pressure during a past pregnancy.  Being overweight.  Being 74 years old or  older.  Being pregnant for the first time.  Being pregnant with more than one baby.  Becoming pregnant using fertility methods, such as IVF.  Having other problems, such as diabetes, or kidney disease.  Having family members who have high blood pressure. What can I do to lower my risk?   Keep a healthy weight.  Eat a healthy diet.  Follow what your doctor tells you about treating any medical problems that you had before becoming pregnant. It is very important to go to all of your doctor visits. Your doctor will check your blood pressure and make sure that your pregnancy is progressing as it should. Treatment should start early if a problem is found. How is this treated? Treatment for high blood pressure during pregnancy can differ depending on the type of high blood pressure you have and how serious it is.  You may need to take blood pressure medicine.  If you have been taking medicine for your blood pressure, you may need to change the medicine during pregnancy if it is not safe for your baby.  If your doctor thinks that you could get very high blood pressure, he or she may tell you to take a low-dose aspirin during your pregnancy.  If you have very high blood pressure, you may need to stay in the hospital so you and your baby can be watched closely. You may also need to take medicine to lower your blood pressure. This medicine may be given by mouth or through an IV tube.  In some cases, if your condition gets worse, you may need to have your baby early. Follow these instructions at home: Eating and drinking   Drink enough fluid to keep your pee (urine) pale yellow.  Avoid caffeine. Lifestyle  Do not use any products that contain nicotine or tobacco, such as cigarettes, e-cigarettes, and chewing tobacco. If you need help quitting, ask your doctor.  Do not use alcohol or drugs.  Avoid stress.  Rest and get plenty of sleep.  Regular exercise can help. Ask your doctor  what kinds of exercise are best for you. General instructions  Take over-the-counter and prescription medicines only as told by your doctor.  Keep all prenatal and follow-up visits as told by your doctor. This is important. Contact a doctor if:  You have symptoms that your doctor told you to watch for, such as: ? Headaches. ? Nausea. ? Vomiting. ? Belly (abdominal) pain. ? Dizziness. ? Light-headedness. Get help right away if:  You have: ? Very bad belly pain that does not get better with treatment. ? A very bad headache that does not get better. ? Vomiting that does not get better. ? Sudden, fast weight gain. ? Sudden swelling in your hands, ankles, or face. ? Bleeding from your vagina. ? Blood in your pee. ? Blurry vision. ? Double vision. ? Shortness of breath. ? Chest pain. ? Weakness on one side of your body. ? Trouble talking.  Your baby is not moving as much as usual. Summary  High  blood pressure is also called hypertension.  High blood pressure means that the force of your blood moving in your body is too strong.  High blood pressure can cause problems for you and your baby.  Keep all follow-up visits as told by your doctor. This is important. This information is not intended to replace advice given to you by your health care provider. Make sure you discuss any questions you have with your health care provider. Document Revised: 09/04/2018 Document Reviewed: 06/10/2018 Elsevier Patient Education  2020 [employer].

## 2020-02-03 NOTE — MAU Provider Note (Signed)
History     CSN: [id number]  Arrival date and time: 02/03/20 1634   First Provider Initiated Contact with Patient 02/03/20 1743      Chief Complaint  Patient presents with  . Abdominal Pain  . Possible Pregnancy   29 y.o. [id number] @[redacted]w[redacted]d  by sure LMP presenting with low abd cramping. Sx started 4 days ago. Rates pain 4/10. She tried Tylenol but didn't help. Denies urinary sx. Having some nausea but no vomiting. No fevers. Denies VB, discharge, itching, or malodor. Hx of gHTN in 1st two pregnancies and PPROM and delivery at 27 weeks in her last pregnancy.   OB History    Gravida  4   Para  3   Term  2   Preterm  1   AB  0   Living  3     SAB  0   TAB  0   Ectopic  0   Multiple  0   Live Births  3           Past Medical History:  Diagnosis Date  . Hernia of abdominal wall 2019  . History of frequent urinary tract infections   . Pregnancy induced hypertension     Past Surgical History:  Procedure Laterality Date  . CERVICAL CERCLAGE N/A 07/03/2018   Procedure: CERCLAGE CERVICAL;  Surgeon: 09/01/2018, MD;  Location: [facility] BIRTHING SUITES;  Service: Gynecology;  Laterality: N/A;  . NO PAST SURGERIES      Family History  Problem Relation Age of Onset  . Heart disease Father 86       AMI  . Cancer Neg Hx   . Diabetes Neg Hx   . Stroke Neg Hx     Social History   Tobacco Use  . Smoking status: Never Smoker  . Smokeless tobacco: Never Used  Vaping Use  . Vaping Use: Never used  Substance Use Topics  . Alcohol use: Not Currently  . Drug use: No    Allergies:  Allergies  Allergen Reactions  . Penicillins Itching and Rash    Has patient had a PCN reaction causing immediate rash, facial/tongue/throat swelling, SOB or lightheadedness with hypotension: No Has patient had a PCN reaction causing severe rash involving mucus membranes or skin necrosis: No Has patient had a PCN reaction that required hospitalization: No Has patient had a PCN reaction  occurring within the last 10 years: No If all of the above answers are "NO", then may proceed with Cephalosporin use.     Medications Prior to Admission  Medication Sig Dispense Refill Last Dose  . ibuprofen (ADVIL) 600 MG tablet Take 1 tablet (600 mg total) by mouth every 8 (eight) hours as needed. 30 tablet 0     Review of Systems  Constitutional: Negative for chills and fever.  Gastrointestinal: Positive for abdominal pain and nausea. Negative for constipation, diarrhea and vomiting.  Genitourinary: Negative for dysuria, frequency, urgency, vaginal bleeding and vaginal discharge.   Physical Exam   Blood pressure (!) 154/93, pulse 98, temperature 98.4 F (36.9 C), temperature source Oral, resp. rate 16, height 5\' 1"  (1.549 m), weight 78.9 kg, last menstrual period 12/16/2019, SpO2 100 %, unknown if currently breastfeeding. Patient Vitals for the past 24 hrs:  BP Temp Temp src Pulse Resp SpO2 Height Weight  02/03/20 1755 (!) 154/93 -- -- -- -- -- -- --  02/03/20 1651 (!) 141/90 98.4 F (36.9 C) Oral 98 16 100 % 5\' 1"  (1.549 m) 78.9 kg   Physical  Exam Vitals and nursing note reviewed. Exam conducted with a chaperone present.  Constitutional:      General: She is not in acute distress.    Appearance: She is well-developed.  HENT:     Head: Normocephalic and atraumatic.  Pulmonary:     Effort: Pulmonary effort is normal. No respiratory distress.  Abdominal:     General: There is no distension.     Palpations: There is no mass.     Tenderness: There is no abdominal tenderness. There is no rebound.  Genitourinary:    Comments: External: no lesions or erythema Uterus: + enlarged, anteverted, non tender, no CMT Adnexae: no masses, no tenderness left, no tenderness right Cervix closed  Musculoskeletal:        General: Normal range of motion.  Skin:    General: Skin is warm and dry.  Neurological:     General: No focal deficit present.     Mental Status: She is alert and  oriented to person, place, and time.  Psychiatric:        Mood and Affect: Mood normal.        Behavior: Behavior normal.    Results for orders placed or performed during the hospital encounter of 02/03/20 (from the past 24 hour(s))  Pregnancy, urine POC     Status: Abnormal   Collection Time: 02/03/20  5:07 PM  Result Value Ref Range   Preg Test, Ur POSITIVE (A) NEGATIVE  Urinalysis, Routine w reflex microscopic Urine, Clean Catch     Status: Abnormal   Collection Time: 02/03/20  5:08 PM  Result Value Ref Range   Color, Urine STRAW (A) YELLOW   APPearance CLEAR CLEAR   Specific Gravity, Urine 1.008 1.005 - 1.030   pH 6.0 5.0 - 8.0   Glucose, UA NEGATIVE NEGATIVE mg/dL   Hgb urine dipstick NEGATIVE NEGATIVE   Bilirubin Urine NEGATIVE NEGATIVE   Ketones, ur NEGATIVE NEGATIVE mg/dL   Protein, ur NEGATIVE NEGATIVE mg/dL   Nitrite NEGATIVE NEGATIVE   Leukocytes,Ua NEGATIVE NEGATIVE  Wet prep, genital     Status: Abnormal   Collection Time: 02/03/20  5:19 PM   Specimen: PATH Cytology Cervicovaginal Ancillary Only  Result Value Ref Range   Yeast Wet Prep HPF POC NONE SEEN NONE SEEN   Trich, Wet Prep NONE SEEN NONE SEEN   Clue Cells Wet Prep HPF POC PRESENT (A) NONE SEEN   WBC, Wet Prep HPF POC MANY (A) NONE SEEN   Sperm NONE SEEN   CBC     Status: Abnormal   Collection Time: 02/03/20  5:29 PM  Result Value Ref Range   WBC 13.9 (H) 4.0 - 10.5 K/uL   RBC 4.66 3.87 - 5.11 MIL/uL   Hemoglobin 13.1 12.0 - 15.0 g/dL   HCT 25.0 36 - 46 %   MCV 86.3 80.0 - 100.0 fL   MCH 28.1 26.0 - 34.0 pg   MCHC 32.6 30.0 - 36.0 g/dL   RDW 53.9 76.7 - 34.1 %   Platelets 335 150 - 400 K/uL   nRBC 0.0 0.0 - 0.2 %  hCG, quantitative, pregnancy     Status: Abnormal   Collection Time: 02/03/20  5:29 PM  Result Value Ref Range   hCG, Beta Chain, Quant, S 77,841 (H) <5 mIU/mL   US OB Comp Less 14 Wks  Result Date: 02/03/2020 CLINICAL DATA:  Abdominal cramping in first trimester of pregnancy EXAM:  OBSTETRIC <14 WK ULTRASOUND TECHNIQUE: Transabdominal ultrasound was performed for evaluation  of the gestation as well as the maternal uterus and adnexal regions. COMPARISON:  None FINDINGS: Intrauterine gestational sac: Present, single Yolk sac:  Present Embryo:  Present Cardiac Activity: Present Heart Rate: I67 bpm CRL:   18.2 mm   8 w 2 d                  [place] EDC: 09/12/2020 Subchorionic hemorrhage: Questionable small subchronic hemorrhage at LEFT fundus Maternal uterus/adnexae: Remainder of uterus unremarkable. RIGHT ovary normal size and morphology, 4.7 x 2.0 x 3.1 cm. LEFT ovary normal size and morphology, 3.7 x 2.4 x 2.5 cm. No free pelvic fluid or adnexal masses. IMPRESSION: Single live intrauterine gestation at 8 weeks 2 days EGA by crown-rump length. Questionable small subchronic hemorrhage. Electronically Signed   By: Ulyses Southward M.D.   On: 02/03/2020 18:45   MAU Course  Procedures  MDM Labs and [place] ordered and reviewed. Normal IUP on [place]. No acute process identified, cramping likely physiologic. Discussed comfort measures. HTN noted today and chart review shows elevated BP in March at PCP visit. Pt denies CHTN but reports hx of gHTN. May need to be started on meds. Instructed pt to monitor BP at home and notify her OB provider if remains elevated. Stable for discharge home.   Assessment and Plan   1. [redacted] weeks gestation of pregnancy   2. Abdominal cramping affecting pregnancy   3. Chronic hypertension affecting pregnancy    Discharge home Follow up at [facility] as scheduled SAB precautions  Allergies as of 02/03/2020      Reactions   Penicillins Itching, Rash   Has patient had a PCN reaction causing immediate rash, facial/tongue/throat swelling, SOB or lightheadedness with hypotension: No Has patient had a PCN reaction causing severe rash involving mucus membranes or skin necrosis: No Has patient had a PCN reaction that required hospitalization: No Has patient had a PCN reaction  occurring within the last 10 years: No If all of the above answers are "NO", then may proceed with Cephalosporin use.      Medication List    STOP taking these medications   ibuprofen 600 MG tablet Commonly known as: ADVIL      Donette Larry, CNM 02/03/2020, 7:29 PM

## 2020-02-03 NOTE — Progress Notes (Signed)
Written and verbal d/c instructions given and understanding voiced. 

## 2020-02-04 LAB — GC/CHLAMYDIA PROBE AMP (~~LOC~~) NOT AT ARMC
Chlamydia: NEGATIVE
Comment: NEGATIVE
Comment: NORMAL
Neisseria Gonorrhea: NEGATIVE

## 2020-02-25 LAB — HEPATITIS C ANTIBODY: HCV Ab: NEGATIVE

## 2020-02-25 LAB — OB RESULTS CONSOLE RUBELLA ANTIBODY, IGM: Rubella: IMMUNE

## 2020-02-25 LAB — OB RESULTS CONSOLE RPR: RPR: NONREACTIVE

## 2020-02-25 LAB — OB RESULTS CONSOLE HEPATITIS B SURFACE ANTIGEN: Hepatitis B Surface Ag: NEGATIVE

## 2020-02-25 LAB — OB RESULTS CONSOLE HIV ANTIBODY (ROUTINE TESTING): HIV: NONREACTIVE

## 2020-03-15 ENCOUNTER — Encounter (HOSPITAL_COMMUNITY): Payer: Self-pay | Admitting: *Deleted

## 2020-03-15 ENCOUNTER — Telehealth (HOSPITAL_COMMUNITY): Payer: Self-pay | Admitting: *Deleted

## 2020-03-15 NOTE — Telephone Encounter (Signed)
Preadmission screen  

## 2020-03-16 ENCOUNTER — Other Ambulatory Visit (HOSPITAL_COMMUNITY)
Admission: RE | Admit: 2020-03-16 | Discharge: 2020-03-16 | Disposition: A | Discharge status: Home / Self Care | Payer: Medicaid Other | Source: Ambulatory Visit | Attending: Obstetrics and Gynecology | Admitting: Obstetrics and Gynecology

## 2020-03-16 DIAGNOSIS — Z01812 Encounter for preprocedural laboratory examination: Secondary | ICD-10-CM | POA: Insufficient documentation

## 2020-03-16 DIAGNOSIS — Z20822 Contact with and (suspected) exposure to covid-19: Secondary | ICD-10-CM | POA: Diagnosis not present

## 2020-03-16 LAB — SARS CORONAVIRUS 2 (TAT 6-24 HRS): SARS Coronavirus 2: NEGATIVE

## 2020-03-18 ENCOUNTER — Encounter (HOSPITAL_COMMUNITY): Payer: Self-pay | Admitting: Obstetrics and Gynecology

## 2020-03-18 ENCOUNTER — Ambulatory Visit (HOSPITAL_COMMUNITY)
Admission: RE | Admit: 2020-03-18 | Discharge: 2020-03-18 | Disposition: A | Discharge status: Home / Self Care | Payer: Medicaid Other | Attending: Obstetrics and Gynecology | Admitting: Obstetrics and Gynecology

## 2020-03-18 ENCOUNTER — Encounter (HOSPITAL_COMMUNITY)
Admission: RE | Disposition: A | Discharge status: Home / Self Care | Payer: Self-pay | Source: Home / Self Care | Attending: Obstetrics and Gynecology

## 2020-03-18 ENCOUNTER — Inpatient Hospital Stay (HOSPITAL_COMMUNITY): Payer: Medicaid Other | Admitting: Anesthesiology

## 2020-03-18 ENCOUNTER — Other Ambulatory Visit: Payer: Self-pay

## 2020-03-18 DIAGNOSIS — Z3A14 14 weeks gestation of pregnancy: Secondary | ICD-10-CM | POA: Insufficient documentation

## 2020-03-18 DIAGNOSIS — Z8249 Family history of ischemic heart disease and other diseases of the circulatory system: Secondary | ICD-10-CM | POA: Diagnosis not present

## 2020-03-18 DIAGNOSIS — O132 Gestational [pregnancy-induced] hypertension without significant proteinuria, second trimester: Secondary | ICD-10-CM | POA: Insufficient documentation

## 2020-03-18 DIAGNOSIS — Z87440 Personal history of urinary (tract) infections: Secondary | ICD-10-CM | POA: Insufficient documentation

## 2020-03-18 DIAGNOSIS — O3432 Maternal care for cervical incompetence, second trimester: Secondary | ICD-10-CM | POA: Insufficient documentation

## 2020-03-18 HISTORY — PX: CERVICAL CERCLAGE: SHX1329

## 2020-03-18 LAB — TYPE AND SCREEN
ABO/RH(D): B POS
Antibody Screen: NEGATIVE

## 2020-03-18 LAB — CBC
HCT: 40.9 % (ref 36.0–46.0)
Hemoglobin: 13.6 g/dL (ref 12.0–15.0)
MCH: 28.9 pg (ref 26.0–34.0)
MCHC: 33.3 g/dL (ref 30.0–36.0)
MCV: 86.8 fL (ref 80.0–100.0)
Platelets: 260 10*3/uL (ref 150–400)
RBC: 4.71 MIL/uL (ref 3.87–5.11)
RDW: 13.1 % (ref 11.5–15.5)
WBC: 13.1 10*3/uL — ABNORMAL HIGH (ref 4.0–10.5)
nRBC: 0 % (ref 0.0–0.2)

## 2020-03-18 SURGERY — CERCLAGE, CERVIX, VAGINAL APPROACH
Anesthesia: Spinal

## 2020-03-18 MED ORDER — FENTANYL CITRATE (PF) 100 MCG/2ML IJ SOLN
INTRAMUSCULAR | Status: DC | PRN
Start: 2020-03-18 — End: 2020-03-18
  Administered 2020-03-18: 20 ug via INTRATHECAL

## 2020-03-18 MED ORDER — FENTANYL CITRATE (PF) 100 MCG/2ML IJ SOLN
INTRAMUSCULAR | Status: AC
  Filled 2020-03-18: qty 2

## 2020-03-18 MED ORDER — OXYCODONE HCL 5 MG/5ML PO SOLN: 5.0000 mg | Freq: Once | ORAL | Status: DC | PRN

## 2020-03-18 MED ORDER — INDOMETHACIN 25 MG PO CAPS: 25.0000 mg | ORAL_CAPSULE | Freq: Four times a day (QID) | ORAL | 0 refills | Status: AC

## 2020-03-18 MED ORDER — CEFAZOLIN SODIUM-DEXTROSE 2-4 GM/100ML-% IV SOLN
INTRAVENOUS | Status: AC
  Filled 2020-03-18: qty 100

## 2020-03-18 MED ORDER — NALBUPHINE HCL 10 MG/ML IJ SOLN: 5.0000 mg | INTRAMUSCULAR | Status: DC | PRN

## 2020-03-18 MED ORDER — MEPERIDINE HCL 25 MG/ML IJ SOLN: 6.2500 mg | INTRAMUSCULAR | Status: DC | PRN

## 2020-03-18 MED ORDER — KETOROLAC TROMETHAMINE 30 MG/ML IJ SOLN: 30.0000 mg | Freq: Four times a day (QID) | INTRAMUSCULAR | Status: DC | PRN

## 2020-03-18 MED ORDER — INDOMETHACIN 25 MG PO CAPS
50.0000 mg | ORAL_CAPSULE | Freq: Once | ORAL | Status: AC
  Administered 2020-03-18: 50 mg via ORAL
  Filled 2020-03-18: qty 2

## 2020-03-18 MED ORDER — NALOXONE HCL 4 MG/10ML IJ SOLN
1.0000 ug/kg/h | INTRAVENOUS | Status: DC | PRN
  Filled 2020-03-18: qty 5

## 2020-03-18 MED ORDER — LACTATED RINGERS IV SOLN: INTRAVENOUS | Status: DC | PRN

## 2020-03-18 MED ORDER — CEFAZOLIN SODIUM-DEXTROSE 2-4 GM/100ML-% IV SOLN: 2.0000 g | INTRAVENOUS | Status: DC

## 2020-03-18 MED ORDER — PHENYLEPHRINE 40 MCG/ML (10ML) SYRINGE FOR IV PUSH (FOR BLOOD PRESSURE SUPPORT)
PREFILLED_SYRINGE | INTRAVENOUS | Status: AC
  Filled 2020-03-18: qty 10

## 2020-03-18 MED ORDER — ONDANSETRON HCL 4 MG/2ML IJ SOLN: 4.0000 mg | Freq: Once | INTRAMUSCULAR | Status: DC | PRN

## 2020-03-18 MED ORDER — POVIDONE-IODINE 10 % EX SWAB
2.0000 "application " | Freq: Once | CUTANEOUS | Status: AC
  Administered 2020-03-18: 2 via TOPICAL

## 2020-03-18 MED ORDER — ONDANSETRON HCL 4 MG/2ML IJ SOLN: 4.0000 mg | Freq: Three times a day (TID) | INTRAMUSCULAR | Status: DC | PRN

## 2020-03-18 MED ORDER — SOD CITRATE-CITRIC ACID 500-334 MG/5ML PO SOLN
30.0000 mL | ORAL | Status: AC
  Administered 2020-03-18: 30 mL via ORAL

## 2020-03-18 MED ORDER — SODIUM CHLORIDE 0.9% FLUSH: 3.0000 mL | INTRAVENOUS | Status: DC | PRN

## 2020-03-18 MED ORDER — BUPIVACAINE IN DEXTROSE 0.75-8.25 % IT SOLN
INTRATHECAL | Status: DC | PRN
  Administered 2020-03-18: 1 mL via INTRATHECAL

## 2020-03-18 MED ORDER — NALBUPHINE HCL 10 MG/ML IJ SOLN: 5.0000 mg | Freq: Once | INTRAMUSCULAR | Status: DC | PRN

## 2020-03-18 MED ORDER — FENTANYL CITRATE (PF) 100 MCG/2ML IJ SOLN: 25.0000 ug | INTRAMUSCULAR | Status: DC | PRN

## 2020-03-18 MED ORDER — NALOXONE HCL 0.4 MG/ML IJ SOLN: 0.4000 mg | INTRAMUSCULAR | Status: DC | PRN

## 2020-03-18 MED ORDER — DIPHENHYDRAMINE HCL 25 MG PO CAPS: 25.0000 mg | ORAL_CAPSULE | ORAL | Status: DC | PRN

## 2020-03-18 MED ORDER — SOD CITRATE-CITRIC ACID 500-334 MG/5ML PO SOLN
ORAL | Status: AC
  Filled 2020-03-18: qty 30

## 2020-03-18 MED ORDER — OXYCODONE HCL 5 MG PO TABS: 5.0000 mg | ORAL_TABLET | Freq: Once | ORAL | Status: DC | PRN

## 2020-03-18 MED ORDER — LACTATED RINGERS IV SOLN: INTRAVENOUS | Status: DC

## 2020-03-18 MED ORDER — DIPHENHYDRAMINE HCL 50 MG/ML IJ SOLN: 12.5000 mg | INTRAMUSCULAR | Status: DC | PRN

## 2020-03-18 MED ORDER — SCOPOLAMINE 1 MG/3DAYS TD PT72: 1.0000 | MEDICATED_PATCH | Freq: Once | TRANSDERMAL | Status: DC

## 2020-03-18 SURGICAL SUPPLY — 18 items
CANISTER SUCT 3000ML PPV (MISCELLANEOUS) ×2 IMPLANT
CATH ROBINSON RED A/P 16FR (CATHETERS) ×1 IMPLANT
GLOVE BIO SURGEON STRL SZ7 (GLOVE) ×2 IMPLANT
GLOVE BIOGEL PI IND STRL 7.0 (GLOVE) ×1 IMPLANT
GLOVE BIOGEL PI INDICATOR 7.0 (GLOVE) ×1
GOWN STRL REUS W/TWL LRG LVL3 (GOWN DISPOSABLE) ×4 IMPLANT
NDL MAYO CATGUT SZ4 TPR NDL (NEEDLE) IMPLANT
NEEDLE MAYO CATGUT SZ4 (NEEDLE) IMPLANT
NS IRRIG 1000ML POUR BTL (IV SOLUTION) ×2 IMPLANT
PACK VAGINAL MINOR WOMEN LF (CUSTOM PROCEDURE TRAY) ×2 IMPLANT
PAD OB MATERNITY 4.3X12.25 (PERSONAL CARE ITEMS) ×2 IMPLANT
PAD PREP 24X48 CUFFED NSTRL (MISCELLANEOUS) ×2 IMPLANT
SUT ETHIBOND  5 (SUTURE) ×2
SUT ETHIBOND 5 (SUTURE) ×3 IMPLANT
TOWEL OR 17X24 6PK STRL BLUE (TOWEL DISPOSABLE) ×4 IMPLANT
TRAY FOLEY W/BAG SLVR 14FR (SET/KITS/TRAYS/PACK) ×1 IMPLANT
TUBING NON-CON 1/4 X 20 CONN (TUBING) IMPLANT
YANKAUER SUCT BULB TIP NO VENT (SUCTIONS) IMPLANT

## 2020-03-18 NOTE — H&P (Addendum)
Kayla [name] is a 29 y.o. female presenting for prophylactic cerclage  29 g4P2103 @ 14+4 presents for prophylactic cerclage placement d/t h/o preterm delivery with last pregnancy due to cervical incompetence. SHe had a rescue cerclage at 20 weeks with last pregnancy OB History    Gravida  4   Para  3   Term  2   Preterm  1   AB  0   Living  3     SAB  0   TAB  0   Ectopic  0   Multiple  0   Live Births  3          Past Medical History:  Diagnosis Date  . Hernia of abdominal wall 2019  . History of frequent urinary tract infections   . Pregnancy induced hypertension    Past Surgical History:  Procedure Laterality Date  . CERVICAL CERCLAGE N/A 07/03/2018   Procedure: CERCLAGE CERVICAL;  Surgeon: Carrington Clamp, MD;  Location: [facility] BIRTHING SUITES;  Service: Gynecology;  Laterality: N/A;  . NO PAST SURGERIES     Family History: family history includes Heart disease (age of onset: 68) in her father. Social History:  reports that she has never smoked. She has never used smokeless tobacco. She reports previous alcohol use. She reports that she does not use drugs.   Review of Systems History   Blood pressure (!) 143/92, pulse (!) 113, temperature 99 F (37.2 C), temperature source Oral, height 5\' 1"  (1.549 m), weight 78.5 kg, last menstrual period 12/16/2019, SpO2 100 %, unknown if currently breastfeeding. Exam Physical Exam  Prenatal labs: ABO, Rh: --/--/B POS (10/22 1010) Antibody: NEG (10/22 1010) Rubella:   RPR:    HBsAg:    HIV:    GBS:     Assessment/Plan: 1) proceed with cerclage 2) scds    10-23-1967 03/18/2020, 11:51 AM

## 2020-03-18 NOTE — Progress Notes (Signed)
Attempt by 2 RNs to find FHT via doppler. Unable to determine by doppler. Faculty physician, Dr. Macon Large asked to find heart tones via ultrasound. Bedside U/S performed. FHT visualized, +FM. Pt in stable condition, IV removed, discharge paperwork given. Pt discharged to home.

## 2020-03-18 NOTE — Anesthesia Procedure Notes (Signed)
Spinal  Patient location during procedure: OR Staffing Performed: anesthesiologist  Anesthesiologist: Jalexia Lalli E, MD Preanesthetic Checklist Completed: patient identified, IV checked, risks and benefits discussed, surgical consent, monitors and equipment checked, pre-op evaluation and timeout performed Spinal Block Patient position: sitting Prep: DuraPrep and site prepped and draped Patient monitoring: continuous pulse ox, blood pressure and heart rate Approach: midline Location: L3-4 Injection technique: single-shot Needle Needle type: Pencan  Needle gauge: 24 G Needle length: 9 cm Additional Notes Functioning IV was confirmed and monitors were applied. Sterile prep and drape, including hand hygiene and sterile gloves were used. The patient was positioned and the spine was prepped. The skin was anesthetized with lidocaine.  Free flow of clear CSF was obtained prior to injecting local anesthetic into the CSF. The needle was carefully withdrawn. The patient tolerated the procedure well.      

## 2020-03-18 NOTE — Transfer of Care (Signed)
Immediate Anesthesia Transfer of Care Note  Patient: Kayla [name]  Procedure(s) Performed: CERCLAGE CERVICAL (N/A )  Patient Location: PACU  Anesthesia Type:Spinal  Level of Consciousness: awake, alert  and oriented  Airway & Oxygen Therapy: Patient Spontanous Breathing  Post-op Assessment: Report given to RN and Post -op Vital signs reviewed and stable  Post vital signs: Reviewed and stable  Last Vitals:  Vitals Value Taken Time  BP    Temp    Pulse    Resp    SpO2      Last Pain:  Vitals:   03/18/20 0956  TempSrc: Oral         Complications: No complications documented.

## 2020-03-18 NOTE — Op Note (Signed)
Pre-Operative Diagnosis: 1) 14+4 WEEK INTRAUTERINE PREGNANCY 2) History of preterm delivery due to cervical incompetence Postoperative Diagnosis: 1) 14+4 WEEK INTRAUTERINE PREGNANCY 2) History of preterm delivery due to cervical incompetence Procedure:Prophylactic Single McDonald Cerclage Surgeon: Dr. Waynard Reeds Assistant: None Operative Findings: 14 week size uterus, Multiparous appearing cervix, visually closed. Scar from prior cerclage noted on cervix.  Specimen: None EBL: Total I/O In: 1000 [I.V.:1000] Out: 405 [Urine:400; Blood:5]  Procedure: Ms Kayla [name] is a P6P9509 @ 14+4 who presents to the operating room for prophylactic cerclage placement due to history of prior preterm delivery due to cervical incompetence.  Risks/benefits/alternatives of the procedure were discussed with the patient at length and she wishes to proceed. Following the appropriate informed consent the patient was taken to the operating room. Spinal anesthesia was placed and found to be adequate. The patient was then appropriately identified during a preoperative time-out procedure. The pateint was then placed in dorsal lithotomy position in Frederika stirrups and the perineum and vagina were prepped and draped in the normal sterile fashion. An in and out bladder catheterization was performed for 200 cc clear urine. A speculum was then placed in the vagina and a ring forcep was used to grasp tha anterior lip of the cervix. A # 5 ethibond suture on a free needle was placed circumferentially at 12:00, 9:00, 6:00, and 3:00. The suture was tied at 12:00 with 5-6 knots, followed by an air knot, and an additional 5-6 knots. This completed the surgical procedure. All sponge, lap, and needle counts were correct. The patient tolerated the procedure well and was transferred to the PACU in stable condition after the procedure

## 2020-03-18 NOTE — Anesthesia Preprocedure Evaluation (Addendum)
Anesthesia Evaluation  Patient identified by MRN, date of birth, ID band Patient awake    Reviewed: Allergy & Precautions, NPO status , Patient's Chart, lab work & pertinent test results  History of Anesthesia Complications Negative for: history of anesthetic complications  Airway Mallampati: II  TM Distance: >3 FB Neck ROM: Full    Dental   Pulmonary neg pulmonary ROS,    Pulmonary exam normal        Cardiovascular hypertension (PIH), Normal cardiovascular exam     Neuro/Psych negative neurological ROS  negative psych ROS   GI/Hepatic negative GI ROS, Neg liver ROS,   Endo/Other  negative endocrine ROS  Renal/GU negative Renal ROS  negative genitourinary   Musculoskeletal negative musculoskeletal ROS (+)   Abdominal   Peds  Hematology negative hematology ROS (+)   Anesthesia Other Findings   Reproductive/Obstetrics (+) Pregnancy Cervical incompetence                           Anesthesia Physical Anesthesia Plan  ASA: II  Anesthesia Plan: Spinal   Post-op Pain Management:    Induction:   PONV Risk Score and Plan: 3 and Ondansetron and Treatment may vary due to age or medical condition  Airway Management Planned: Natural Airway  Additional Equipment: None  Intra-op Plan:   Post-operative Plan:   Informed Consent: I have reviewed the patients History and Physical, chart, labs and discussed the procedure including the risks, benefits and alternatives for the proposed anesthesia with the patient or authorized representative who has indicated his/her understanding and acceptance.       Plan Discussed with:   Anesthesia Plan Comments:        Anesthesia Quick Evaluation

## 2020-03-18 NOTE — Anesthesia Postprocedure Evaluation (Signed)
Anesthesia Post Note  Patient: Kayla [name]  Procedure(s) Performed: CERCLAGE CERVICAL (N/A )     Patient location during evaluation: PACU Anesthesia Type: Spinal Level of consciousness: oriented and awake and alert Pain management: pain level controlled Vital Signs Assessment: post-procedure vital signs reviewed and stable Respiratory status: spontaneous breathing, respiratory function stable and nonlabored ventilation Cardiovascular status: blood pressure returned to baseline and stable Postop Assessment: no headache, no backache, no apparent nausea or vomiting and spinal receding Anesthetic complications: no   No complications documented.  Last Vitals:  Vitals:   03/18/20 1445 03/18/20 1500  BP: 137/85   Pulse: 100 (!) 112  Resp: (!) 23 (!) 23  Temp:    SpO2: 100% 100%    Last Pain:  Vitals:   03/18/20 1445  TempSrc:   PainSc: 0-No pain   Pain Goal:                Epidural/Spinal Function Cutaneous sensation: Normal sensation (03/18/20 1500), Patient able to flex knees: Yes (03/18/20 1500), Patient able to lift hips off bed: Yes (pt unable to take steps) (03/18/20 1500), Back pain beyond tenderness at insertion site: No (03/18/20 1500), Progressively worsening motor and/or sensory loss: No (03/18/20 1500), Bowel and/or bladder incontinence post epidural: No (03/18/20 1500)  Lucretia Kern

## 2020-03-18 NOTE — Discharge Instructions (Signed)
Cervical Cerclage, Care After This sheet gives you information about how to care for yourself after your procedure. Your health care provider may also give you more specific instructions. If you have problems or questions, contact your health care provider. What can I expect after the procedure? After your procedure, it is common to have:  Cramping in your abdomen.  Mucus discharge from your vagina. This may last for several days.  Painful urination (dysuria).  Spotting, or small drops of blood coming from your vagina. Follow these instructions at home:  Medicines  Take over-the-counter and prescription medicines only as told by your health care provider.  Ask your health care provider if the medicine prescribed to you requires you to avoid driving or using heavy machinery. General instructions  If you are told to go on bed rest, follow instructions from your health care provider. You may need to be on bed rest for up to 3 days.  Keep track of your vaginal discharge and watch for any changes. If you notice changes, tell your health care provider.  Avoid physical activities and exercise until your health care provider approves. Ask your health care provider what activities are safe for you.  Do not douche or have sex until your health care provider says it is okay to do so.  Keep all follow-up visits, including prenatal visits, as told by your health care provider. This is important. ? Prenatal visits are all the care that you receive before the birth of your baby. ? You may also need an ultrasound.  You may be asked to have weekly visits to have your cervix checked. Contact a health care provider if you:  Have abnormal discharge from your vagina, such as clots.  Have a bad-smelling discharge from your vagina.  Develop a rash on your skin. This may look like redness and swelling.  Become light-headed or feel like you are going to faint.  Have abdominal pain that does not  get better with medicine.  Have nausea or vomiting that does not go away. Get help right away if you:  Have vaginal bleeding that is heavier or more frequent than spotting.  Are leaking fluid or your water breaks.  Have a fever or chills.  Faint.  Have uterine contractions. These may feel like: ? A back ache. ? Lower abdominal pain. ? Mild cramps, similar to menstrual cramps. ? Tightening or pressure in your abdomen.  Think that your baby is not moving as much as usual, or you cannot feel your baby move.  Have chest pain.  Have shortness of breath. Summary  After the procedure, it is common to have cramping, vaginal discharge, painful urination, and small drops of blood coming from your vagina.  If you are told to go on bed rest, follow instructions from your health care provider. You may need to be on bed rest for up to 3 days.  Keep track of your vaginal discharge and watch for any changes. If you notice changes, tell your health care provider.  Contact a health care provider if you have abnormal vaginal discharge, become light-headed, or have pain that cannot be controlled with medicines.  Get help right away if you have heavy vaginal bleeding, your water breaks, or you have uterine contractions. Also, get help right away if your baby is not moving as much as usual, or you have chest pain or shortness of breath. This information is not intended to replace advice given to you by your health care provider.   Make sure you discuss any questions you have with your health care provider. Document Revised: 03/10/2019 Document Reviewed: 01/07/2019 Elsevier Patient Education  2020 [employer]  

## 2020-03-19 ENCOUNTER — Encounter (HOSPITAL_COMMUNITY): Payer: Self-pay | Admitting: Obstetrics and Gynecology

## 2020-04-26 ENCOUNTER — Encounter (HOSPITAL_COMMUNITY): Payer: Self-pay | Admitting: Obstetrics and Gynecology

## 2020-05-19 ENCOUNTER — Ambulatory Visit
Admission: EM | Admit: 2020-05-19 | Discharge: 2020-05-19 | Disposition: A | Discharge status: Home / Self Care | Payer: BLUE CROSS/BLUE SHIELD | Attending: Emergency Medicine | Admitting: Emergency Medicine

## 2020-05-19 ENCOUNTER — Other Ambulatory Visit: Payer: Self-pay

## 2020-05-19 DIAGNOSIS — H60392 Other infective otitis externa, left ear: Secondary | ICD-10-CM | POA: Diagnosis not present

## 2020-05-19 MED ORDER — NEOMYCIN-POLYMYXIN-HC 3.5-10000-1 OT SUSP: 3.0000 [drp] | Freq: Three times a day (TID) | OTIC | 0 refills | Status: DC

## 2020-05-19 NOTE — ED Provider Notes (Signed)
[facility]    CSN: [id number] Arrival date & time: 05/19/20  1116      History   Chief Complaint Chief Complaint  Patient presents with  . Otalgia    Left side since yesterday    HPI Kayla [name] is a 29 y.o. female  Sending for left ear pain since yesterday.  No drainage, bleeding, trauma, travel.  Is [redacted] weeks pregnant.  Past Medical History:  Diagnosis Date  . Hernia of abdominal wall 2019  . History of frequent urinary tract infections   . Pregnancy induced hypertension     Patient Active Problem List   Diagnosis Date Noted  . Preterm labor 08/15/2018  . Preterm premature rupture of membranes (PPROM) with unknown onset of labor 08/15/2018  . Postoperative state 07/03/2018  . Diastasis recti 12/12/2017  . Class 1 obesity with body mass index (BMI) of 32.0 to 32.9 in adult 12/07/2016  . Pregnancy 09/01/2015  . Spontaneous vaginal delivery 09/01/2015  . Abdominal pain affecting pregnancy, antepartum 01/06/2015  . Family history of early CAD 2014-03-10  . Family history of early death 03-10-14  . Routine gynecological examination 10/01/2012  . Unspecified symptom associated with female genital organs 10/01/2012  . Contraception management 10/01/2012    Past Surgical History:  Procedure Laterality Date  . CERVICAL CERCLAGE N/A 07/03/2018   Procedure: CERCLAGE CERVICAL;  Surgeon: Carrington Clamp, MD;  Location: [facility] BIRTHING SUITES;  Service: Gynecology;  Laterality: N/A;  . CERVICAL CERCLAGE N/A 03/18/2020   Procedure: CERCLAGE CERVICAL;  Surgeon: Waynard Reeds, MD;  Location: [facility] LD ORS;  Service: Gynecology;  Laterality: N/A;  . NO PAST SURGERIES      OB History    Gravida  4   Para  3   Term  2   Preterm  1   AB  0   Living  3     SAB  0   IAB  0   Ectopic  0   Multiple  0   Live Births  3            Home Medications    Prior to Admission medications   Medication Sig Start Date End Date Taking? Authorizing  Provider  neomycin-polymyxin-hydrocortisone (CORTISPORIN) 3.5-10000-1 OTIC suspension Place 3 drops into the left ear 3 (three) times daily. 05/19/20   Hall-Potvin, [place], PA-C    Family History Family History  Problem Relation Age of Onset  . Heart disease Father 17       AMI  . Cancer Neg Hx   . Diabetes Neg Hx   . Stroke Neg Hx     Social History Social History   Tobacco Use  . Smoking status: Never Smoker  . Smokeless tobacco: Never Used  Vaping Use  . Vaping Use: Never used  Substance Use Topics  . Alcohol use: Not Currently  . Drug use: No     Allergies   Penicillins   Review of Systems Review of Systems  Constitutional: Negative for fatigue and fever.  HENT: Positive for ear pain. Negative for congestion, dental problem, ear discharge, facial swelling, hearing loss, sinus pain, sore throat, trouble swallowing and voice change.   Eyes: Negative for photophobia, pain and visual disturbance.  Respiratory: Negative for cough and shortness of breath.   Cardiovascular: Negative for chest pain and palpitations.  Gastrointestinal: Negative for diarrhea and vomiting.  Musculoskeletal: Negative for arthralgias and myalgias.  Neurological: Negative for dizziness and headaches.     Physical Exam Triage  Vital Signs ED Triage Vitals  Enc Vitals Group     BP 05/19/20 1130 127/87     Pulse Rate 05/19/20 1130 (!) 110     Resp 05/19/20 1130 17     Temp 05/19/20 1130 98 F (36.7 C)     Temp Source 05/19/20 1130 Oral     SpO2 05/19/20 1130 98 %     Weight --      Height --      Head Circumference --      Peak Flow --      Pain Score 05/19/20 1131 9     Pain Loc --      Pain Edu? --      Excl. in GC? --    No data found.  Updated Vital Signs BP 127/87 (BP Location: Right Arm)   Pulse (!) 110   Temp 98 F (36.7 C) (Oral)   Resp 17   LMP 12/16/2019   SpO2 98%   Visual Acuity Right Eye Distance:   Left Eye Distance:   Bilateral Distance:    Right Eye  Near:   Left Eye Near:    Bilateral Near:     Physical Exam Constitutional:      General: She is not in acute distress. HENT:     Head: Normocephalic and atraumatic.     Jaw: There is normal jaw occlusion. No tenderness or pain on movement.     Right Ear: Hearing, tympanic membrane, ear canal and external ear normal. No tenderness. No mastoid tenderness.     Left Ear: Hearing and tympanic membrane normal. No tenderness. No mastoid tenderness.     Ears:     Comments: Left ear with tragal tenderness and EAC swelling, erythema.  TM intact.    Nose: No nasal deformity, septal deviation or nasal tenderness.     Right Turbinates: Not swollen or pale.     Left Turbinates: Not swollen or pale.     Right Sinus: No maxillary sinus tenderness or frontal sinus tenderness.     Left Sinus: No maxillary sinus tenderness or frontal sinus tenderness.     Mouth/Throat:     Lips: Pink. No lesions.     Mouth: Mucous membranes are moist. No injury.     Pharynx: Oropharynx is clear. Uvula midline. No posterior oropharyngeal erythema or uvula swelling.     Comments: no tonsillar exudate or hypertrophy Cardiovascular:     Rate and Rhythm: Normal rate.  Pulmonary:     Effort: Pulmonary effort is normal.  Musculoskeletal:     Cervical back: Normal range of motion and neck supple. No muscular tenderness.  Lymphadenopathy:     Cervical: No cervical adenopathy.  Neurological:     Mental Status: She is alert and oriented to person, place, and time.      UC Treatments / Results  Labs (all labs ordered are listed, but only abnormal results are displayed) Labs Reviewed - No data to display  EKG   Radiology No results found.  Procedures Procedures (including critical care time)  Medications Ordered in UC Medications - No data to display  Initial Impression / Assessment and Plan / UC Course  I have reviewed the triage vital signs and the nursing notes.  Pertinent labs & imaging results that  were available during my care of the patient were reviewed by me and considered in my medical decision making (see chart for details).     H&P concerning for AOE: We will start  Cortisporin.  Return precautions discussed, pt verbalized understanding and is agreeable to plan. Final Clinical Impressions(s) / UC Diagnoses   Final diagnoses:  Infective otitis externa of left ear     Discharge Instructions     Use eardrops as prescribed for the next week. Return for worsening ear pain, swelling, discharge, bleeding, decreased hearing, development of jaw pain/swelling, fever.  Do NOT use Q-tips as these can cause your ear wax to get stuck, the tips may break off and become a foreign body requiring additional medical care, or puncture your eardrum.  Helpful prevention tip: Use a solution of equal parts isopropyl (rubbing) alcohol and white vinegar (acetic acid) in both ears after swimming.    ED Prescriptions    Medication Sig Dispense Auth. Provider   neomycin-polymyxin-hydrocortisone (CORTISPORIN) 3.5-10000-1 OTIC suspension Place 3 drops into the left ear 3 (three) times daily. 10 mL Hall-Potvin, [place], PA-C     PDMP not reviewed this encounter.   Hall-Potvin, [place], [place] 05/19/20 1230

## 2020-05-19 NOTE — ED Triage Notes (Signed)
Patient states she has had left ear pain since yesterday and has not noticed any drainage. Pt states she has no other symptoms. Pt is [redacted] weeks pregnant. Pt is aox4 and ambulatory.

## 2020-05-19 NOTE — Discharge Instructions (Signed)
Use eardrops as prescribed for the next week. Return for worsening ear pain, swelling, discharge, bleeding, decreased hearing, development of jaw pain/swelling, fever.  Do NOT use Q-tips as these can cause your ear wax to get stuck, the tips may break off and become a foreign body requiring additional medical care, or puncture your eardrum.  Helpful prevention tip: Use a solution of equal parts isopropyl (rubbing) alcohol and white vinegar (acetic acid) in both ears after swimming. 

## 2020-05-23 ENCOUNTER — Encounter (HOSPITAL_COMMUNITY): Payer: Self-pay | Admitting: Obstetrics & Gynecology

## 2020-05-23 ENCOUNTER — Inpatient Hospital Stay (HOSPITAL_COMMUNITY)
Admission: AD | Admit: 2020-05-23 | Discharge: 2020-05-23 | Disposition: A | Discharge status: Home / Self Care | Payer: Medicaid Other | Attending: Obstetrics & Gynecology | Admitting: Obstetrics & Gynecology

## 2020-05-23 ENCOUNTER — Inpatient Hospital Stay (HOSPITAL_BASED_OUTPATIENT_CLINIC_OR_DEPARTMENT_OTHER): Payer: Medicaid Other

## 2020-05-23 ENCOUNTER — Other Ambulatory Visit: Payer: Self-pay

## 2020-05-23 DIAGNOSIS — O99212 Obesity complicating pregnancy, second trimester: Secondary | ICD-10-CM

## 2020-05-23 DIAGNOSIS — O09219 Supervision of pregnancy with history of pre-term labor, unspecified trimester: Secondary | ICD-10-CM | POA: Diagnosis not present

## 2020-05-23 DIAGNOSIS — O4692 Antepartum hemorrhage, unspecified, second trimester: Secondary | ICD-10-CM | POA: Diagnosis not present

## 2020-05-23 DIAGNOSIS — O3432 Maternal care for cervical incompetence, second trimester: Secondary | ICD-10-CM

## 2020-05-23 DIAGNOSIS — Z3A24 24 weeks gestation of pregnancy: Secondary | ICD-10-CM | POA: Diagnosis not present

## 2020-05-23 DIAGNOSIS — E669 Obesity, unspecified: Secondary | ICD-10-CM | POA: Diagnosis not present

## 2020-05-23 DIAGNOSIS — N939 Abnormal uterine and vaginal bleeding, unspecified: Secondary | ICD-10-CM

## 2020-05-23 DIAGNOSIS — O3430 Maternal care for cervical incompetence, unspecified trimester: Secondary | ICD-10-CM

## 2020-05-23 LAB — WET PREP, GENITAL
Sperm: NONE SEEN
Trich, Wet Prep: NONE SEEN
Yeast Wet Prep HPF POC: NONE SEEN

## 2020-05-23 LAB — CBC
HCT: 31.9 % — ABNORMAL LOW (ref 36.0–46.0)
Hemoglobin: 11.2 g/dL — ABNORMAL LOW (ref 12.0–15.0)
MCH: 30.6 pg (ref 26.0–34.0)
MCHC: 35.1 g/dL (ref 30.0–36.0)
MCV: 87.2 fL (ref 80.0–100.0)
Platelets: 304 10*3/uL (ref 150–400)
RBC: 3.66 MIL/uL — ABNORMAL LOW (ref 3.87–5.11)
RDW: 13.4 % (ref 11.5–15.5)
WBC: 17.3 10*3/uL — ABNORMAL HIGH (ref 4.0–10.5)
nRBC: 0 % (ref 0.0–0.2)

## 2020-05-23 MED ORDER — METRONIDAZOLE 500 MG PO TABS: 500.0000 mg | ORAL_TABLET | Freq: Two times a day (BID) | ORAL | 0 refills | Status: DC

## 2020-05-23 NOTE — MAU Note (Signed)
Pt has had no further bleeding or vaginal discharge since arrival

## 2020-05-23 NOTE — Discharge Instructions (Signed)
Cervical Cerclage  Cervical cerclage is a surgical procedure to correct a cervix that opens up and thins out before pregnancy is at term. This is also called cervical insufficiency, or incompetent cervix. This condition can cause labor to start early (prematurely). In this procedure, a health care provider uses stitches (sutures) to sew the cervix shut during pregnancy. Your health care provider may use ultrasound to help guide the procedure and monitor your baby. Ultrasound uses sound waves to take images of your cervix and uterus. The health care provider will assess these images on a monitor in the operating room. Tell a health care provider about:  Any allergies you have, especially any allergies related to prescribed medicine, stitches, or anesthetic medicines.  Any problems you or family members have had with anesthetic medicines.  Any blood disorders you have.  Any surgeries you have had, including prior cervical stitching.  Any medical conditions you have or have had. What are the risks? Generally, this is a safe procedure. However, problems may occur, including:  Infection, such as infection of the cervix or the bag of fluid that surrounds the baby (amniotic sac).  Vaginal bleeding.  Allergic reactions to medicines.  Damage to nearby structures or organs, such as injury to the cervix or tearing of the amniotic sac.  Contractions that come too early, including going into early labor and delivery.  Cervical dystocia. This occurs when the cervix is unable to open normally during labor. What happens before the procedure? Staying hydrated Follow instructions from your health care provider about hydration, which may include:  Up to 2 hours before the procedure - you may continue to drink clear liquids, such as water, clear fruit juice, black coffee, and plain tea.  Eating and drinking restrictions Follow instructions from your health care provider about eating and drinking,  which may include:  8 hours before the procedure - stop eating heavy meals or foods, such as meat, fried foods, or fatty foods.  6 hours before the procedure - stop eating light meals or foods, such as toast or cereal.  6 hours before the procedure - stop drinking milk or drinks that contain milk.  2 hours before the procedure - stop drinking clear liquids. Medicines Ask your health care provider about:  Changing or stopping your regular medicines. This is especially important if you are taking diabetes medicines or blood thinners.  Taking medicines such as aspirin and ibuprofen. These medicines can thin your blood. Do not take these medicines unless your health care provider tells you to take them.  Taking over-the-counter medicines, vitamins, herbs, and supplements. Surgery safety Ask your health care provider:  How your surgery site will be marked.  What steps will be taken to help prevent infection. These may include: ? Removing hair at the surgery site. ? Washing skin with a germ-killing soap. ? Taking antibiotic medicine. General instructions  Do not put on any lotion, deodorant, or perfume.  Remove contact lenses and jewelry.  You may have an exam or testing, including blood or urine tests.  Plan to have someone take you home from the hospital or clinic.  If you will be going home right after the procedure, plan to have someone with you for 24 hours. What happens during the procedure?  An IV will be inserted into one of your veins.  You may be given one or more of the following: ? A medicine to help you relax (sedative). ? A medicine to numb the area (local anesthetic). ?   A medicine to make you fall asleep (general anesthetic). ? A medicine that is injected into your spine to numb the area below and slightly above the injection site (spinal anesthetic).  A lubricated instrument (speculum) will be inserted into your vagina. The speculum will be widened to open the  walls of your vagina so your surgeon can see your cervix.  Your cervix will be grasped and tightly sutured to close it. To do this, your surgeon will stitch a strong band of thread around your cervix, then the thread will be tightened to hold your cervix shut. The procedure may vary among health care providers and hospitals. What happens after the procedure?  Your blood pressure, heart rate, breathing rate, and blood oxygen level will be monitored until you leave the hospital or clinic.  You will be monitored for premature contractions.  You may have light bleeding and mild cramping.  You may have to wear compression stockings. These stockings help to prevent blood clots and reduce swelling in your legs.  If you were given a sedative during the procedure, it can affect you for several hours. Do not drive or operate machinery until your health care provider says that it is safe.  You may be put on bed rest.  You may be given an injection of a hormone (progesterone) to prevent premature contractions. Summary  Cervical cerclage is a surgical procedure in which stitches are used to sew the cervix shut during pregnancy.  Before the procedure, tell your health care provider about your medicines, or medical problems or blood disorders that you have.  This is a safe procedure. However, problems may occur, including infection, bleeding, or premature labor.  Follow all instructions about eating and drinking before the procedure. Plan to have someone drive you home from the hospital or clinic. This information is not intended to replace advice given to you by your health care provider. Make sure you discuss any questions you have with your health care provider. Document Revised: 03/10/2019 Document Reviewed: 01/07/2019 [employer] Patient Education  2020 [employer]  

## 2020-05-23 NOTE — MAU Note (Signed)
Pt reporting bright red bleeding. Cerclage in place since Oct. Denies cramping or cntrx. +FM

## 2020-05-23 NOTE — MAU Provider Note (Signed)
History     CSN: [id number]  Arrival date and time: 05/23/20 1752   Event Date/Time   First Provider Initiated Contact with Patient 05/23/20 1851      Chief Complaint  Patient presents with  . Vaginal Bleeding   HPI Kayla [name] is a 29 y.o. [phone] at [redacted]w[redacted]d who presents to MAU with chief complaint of vaginal bleeding in the setting of [place] Cerclage. Patient states she had a bowel movement then noticed bright red bleeding. She denies ongoing bleeding. She has not donned a pad. Pain score is 0/10. She is remote from sexual intercourse.  Patient receives care with [facility].   OB History    Gravida  4   Para  3   Term  2   Preterm  1   AB  0   Living  3     SAB  0   IAB  0   Ectopic  0   Multiple  0   Live Births  3           Past Medical History:  Diagnosis Date  . Hernia of abdominal wall 2019  . History of frequent urinary tract infections   . Pregnancy induced hypertension   . Preterm labor     Past Surgical History:  Procedure Laterality Date  . CERVICAL CERCLAGE N/A 07/03/2018   Procedure: CERCLAGE CERVICAL;  Surgeon: Carrington Clamp, MD;  Location: [facility] BIRTHING SUITES;  Service: Gynecology;  Laterality: N/A;  . CERVICAL CERCLAGE N/A 03/18/2020   Procedure: CERCLAGE CERVICAL;  Surgeon: Waynard Reeds, MD;  Location: [facility] LD ORS;  Service: Gynecology;  Laterality: N/A;  . NO PAST SURGERIES      Family History  Problem Relation Age of Onset  . Heart disease Father 29       AMI  . Cancer Neg Hx   . Diabetes Neg Hx   . Stroke Neg Hx     Social History   Tobacco Use  . Smoking status: Never Smoker  . Smokeless tobacco: Never Used  Vaping Use  . Vaping Use: Never used  Substance Use Topics  . Alcohol use: Not Currently  . Drug use: No    Allergies:  Allergies  Allergen Reactions  . Penicillins Itching and Rash    Has patient had a PCN reaction causing immediate rash, facial/tongue/throat swelling, SOB or lightheadedness with  hypotension: No Has patient had a PCN reaction causing severe rash involving mucus membranes or skin necrosis: No Has patient had a PCN reaction that required hospitalization: No Has patient had a PCN reaction occurring within the last 10 years: No If all of the above answers are "NO", then may proceed with Cephalosporin use.     Medications Prior to Admission  Medication Sig Dispense Refill Last Dose  . neomycin-polymyxin-hydrocortisone (CORTISPORIN) 3.5-10000-1 OTIC suspension Place 3 drops into the left ear 3 (three) times daily. 10 mL 0 Past Week at Unknown time  . Prenatal Vit-Fe Fumarate-FA (PRENATAL VITAMINS PO) Take 1 tablet by mouth daily.   05/23/2020 at Unknown time    Review of Systems  Gastrointestinal: Negative for abdominal pain.  Genitourinary: Positive for vaginal bleeding. Negative for dysuria.  Musculoskeletal: Negative for back pain.  All other systems reviewed and are negative.  Physical Exam   Blood pressure 129/67, pulse (!) 115, temperature 99.3 F (37.4 C), temperature source Oral, resp. rate 18, height 5\' 1"  (1.549 m), weight 79.8 kg, last menstrual period 12/16/2019, SpO2 100 %, unknown if currently breastfeeding.  Physical Exam Vitals and nursing note reviewed. Exam conducted with a chaperone present.  Constitutional:      General: She is not in acute distress.    Appearance: Normal appearance.  Cardiovascular:     Rate and Rhythm: Normal rate.     Pulses: Normal pulses.  Pulmonary:     Effort: Pulmonary effort is normal.     Breath sounds: Normal breath sounds.  Abdominal:     Tenderness: There is no right CVA tenderness or left CVA tenderness.     Comments: Gravid  Genitourinary:    Comments: Pelvic exam: External genitalia normal, vaginal walls pink and well rugated, cervix visually closed, no lesions noted. Scant amount of clear, thin discharge with a few streaks of pale brown. Cerclage suture visualized at 12 o clock. No blood in vault.    Skin:    General: Skin is warm and dry.     Capillary Refill: Capillary refill takes less than 2 seconds.  Neurological:     Mental Status: She is alert and oriented to person, place, and time.  Psychiatric:        Mood and Affect: Mood normal.        Behavior: Behavior normal.        Thought Content: Thought content normal.        Judgment: Judgment normal.     MAU Course  Procedures: speculum exam, OB MFM Limited Scan  --Reassuring fetal surveillance.  --Toco: quiet --Pain score 0/10 throughout period of evaluation in MAU --+ Clue cells, pelvic exam findings c/w Bacterial Vaginosis, will treat for BV --Discussed with Dr. Debroah Loop, who agrees patient is safe for discharge home with bleeding precautions  Orders Placed This Encounter  Procedures  . Wet prep, genital    Standing Status:   Standing    Number of Occurrences:   1  . [place] MFM OB Limited    One episode of bleeding after bowel movement. No active bleeding on spec exam, pain score 0/10    Standing Status:   Standing    Number of Occurrences:   1    Order Specific Question:   Reason for Exam (SYMPTOM  OR DIAGNOSIS REQUIRED)    Answer:   bleeding with cerclage in place  . [place] MFM OB Transvaginal    Cerclage in place    Standing Status:   Standing    Number of Occurrences:   1    Order Specific Question:   Symptom/Reason for Exam    Answer:   Cervical cerclage suture present, unspecified trimester [458099]  . CBC    Standing Status:   Standing    Number of Occurrences:   1   Patient Vitals for the past 24 hrs:  BP Temp Temp src Pulse Resp SpO2 Height Weight  05/23/20 2133 124/64 97.9 F (36.6 C) Oral (!) 102 17 100 % -- --  05/23/20 1930 -- -- -- -- -- 99 % -- --  05/23/20 1826 129/67 99.3 F (37.4 C) Oral (!) 115 18 100 % 5\' 1"  (1.549 m) 79.8 kg   Results for orders placed or performed during the hospital encounter of 05/23/20 (from the past 24 hour(s))  CBC     Status: Abnormal   Collection Time: 05/23/20   7:18 PM  Result Value Ref Range   WBC 17.3 (H) 4.0 - 10.5 K/uL   RBC 3.66 (L) 3.87 - 5.11 MIL/uL   Hemoglobin 11.2 (L) 12.0 - 15.0 g/dL   HCT 05/25/20 (L)  36.0 - 46.0 %   MCV 87.2 80.0 - 100.0 fL   MCH 30.6 26.0 - 34.0 pg   MCHC 35.1 30.0 - 36.0 g/dL   RDW 56.9 79.4 - 80.1 %   Platelets 304 150 - 400 K/uL   nRBC 0.0 0.0 - 0.2 %  Wet prep, genital     Status: Abnormal   Collection Time: 05/23/20  7:20 PM   Specimen: PATH Cytology Cervicovaginal Ancillary Only  Result Value Ref Range   Yeast Wet Prep HPF POC NONE SEEN NONE SEEN   Trich, Wet Prep NONE SEEN NONE SEEN   Clue Cells Wet Prep HPF POC PRESENT (A) NONE SEEN   WBC, Wet Prep HPF POC MANY (A) NONE SEEN   Sperm NONE SEEN    [place] MFM OB Transvaginal  Result Date: 05/23/2020 ----------------------------------------------------------------------  OBSTETRICS REPORT                       (Signed Final 05/23/2020 09:15 pm) ---------------------------------------------------------------------- Patient Info  ID #:       [id number]                          D.O.B.:  [birth year] (29 yrs)  Name:       Kayla [name]             Visit Date: 05/23/2020 07:10 pm ---------------------------------------------------------------------- Performed By  Attending:        Noralee Space MD        Secondary Phy.:   Calvert Cantor  Performed By:     Sandi Mealy        [facility]  Referred By:      [facility] MAU/Triage ---------------------------------------------------------------------- Orders  #  Description                           Code        Ordered By  1  [place] MFM OB LIMITED                     07867.54    Clayton Bibles  2  [place] MFM OB TRANSVAGINAL                [phone]     [facility]  Petrice Beedy  ----------------------------------------------------------------------  #  Order #                     Accession #                Episode #  1  [id number]                   [id number]                 [id number]  2  [id number]                   [id number]                 [id number] ---------------------------------------------------------------------- Indications  Vaginal bleeding in pregnancy, second          O46.92  trimester  Obesity complicating pregnancy, second         O99.212  trimester (BMI 33)  Previous cervical surgery (Cervical Cerclage   O34.40   02/2020)  Poor obstetric history: Previous preterm       O09.219  delivery, antepartum  Poor obstetric history (prior pre-term labor)  O09.219  Cervical cerclage suture present, second       O34.32  trimester  [redacted] weeks gestation of pregnancy                Z3A.24 ---------------------------------------------------------------------- Fetal Evaluation  Num Of Fetuses:         1  Fetal Heart Rate(bpm):  144  Cardiac Activity:       Observed  Presentation:           Transverse, head to maternal right  Placenta:               Posterior  P. Cord Insertion:      Visualized  Amniotic Fluid  AFI FV:      Within normal limits                              Largest Pocket(cm)                              4.67 ---------------------------------------------------------------------- Biometry  LV:        5.3  mm ---------------------------------------------------------------------- OB History  Gravidity:    4         Term:   2        Prem:   1  Living:       3 ---------------------------------------------------------------------- Gestational Age  Best:          24w 0d     Det. By:  Previous Ultrasound      EDD:   09/12/20                                      (02/03/20) ---------------------------------------------------------------------- Anatomy  Diaphragm:             Appears normal         Kidneys:                Appear normal  Stomach:               Appears normal, left    Bladder:                Appears  normal                         sided  Abdomen:               Appears normal ---------------------------------------------------------------------- Cervix Uterus Adnexa  Cervix  Length:           2.79  cm. ---------------------------------------------------------------------- Impression  Patient is being evaluated at the MAU for c/o vaginal  bleeding. She had prophylactic cerclage in this pregnancy.  A limited ultrasound study was performed .Amniotic fluid is  normal and good fetal activity is seen . Placenta appears  normal with no retroplacental clots.  On transvaginal ultrasound, the shortest cervical length  measurement is 2.5 cm and small funneling with amniotic  fluid sludge is seen. The cerclage is seen and is intact. ----------------------------------------------------------------------                  Noralee Space, MD Electronically Signed Final Report   05/23/2020 09:15 pm ----------------------------------------------------------------------  [place] MFM OB Limited  Result Date: 05/23/2020 ----------------------------------------------------------------------  OBSTETRICS REPORT                       (Signed Final 05/23/2020 09:15 pm) ---------------------------------------------------------------------- Patient Info  ID #:       [id number]                          D.O.B.:  07/09/1990 (29 yrs)  Name:       Kayla [name]             Visit Date: 05/23/2020 07:10 pm ---------------------------------------------------------------------- Performed By  Attending:        Noralee Space MD        Secondary Phy.:   Calvert Cantor  Performed By:     Sandi Mealy        [facility]  Referred By:      [facility] MAU/Triage ---------------------------------------------------------------------- Orders  #  Description                            Code        Ordered By  1  [place] MFM OB LIMITED                     93716.96    Clayton Bibles  2  US MFM OB TRANSVAGINAL                [id number]     Clayton BiblesSAMANTHA                                                       Gaylyn Berish ----------------------------------------------------------------------  #  Order #                     Accession #                Episode #  1  [id number]                   [phone]                 [id number]  2  [id number]                   [phone]                 [id number] ---------------------------------------------------------------------- Indications  Vaginal bleeding in pregnancy, second          O46.92  trimester  Obesity complicating pregnancy, second         O99.212  trimester (BMI 33)  Previous cervical surgery (Cervical Cerclage   O34.40   02/2020)  Poor obstetric history: Previous preterm       O09.219  delivery, antepartum  Poor obstetric history (prior pre-term labor)  O09.219  Cervical cerclage suture present, second       O34.32  trimester  [redacted] weeks gestation of pregnancy                Z3A.24 ---------------------------------------------------------------------- Fetal Evaluation  Num Of Fetuses:         1  Fetal Heart Rate(bpm):  144  Cardiac Activity:       Observed  Presentation:           Transverse, head to maternal right  Placenta:               Posterior  P. Cord Insertion:      Visualized  Amniotic Fluid  AFI FV:      Within normal limits                              Largest Pocket(cm)                              4.67 ---------------------------------------------------------------------- Biometry  LV:        5.3  mm ---------------------------------------------------------------------- OB History  Gravidity:    4         Term:   2        Prem:   1  Living:       3 ---------------------------------------------------------------------- Gestational Age  Best:          24w 0d     Det. By:  Previous Ultrasound       EDD:   09/12/20                                      (02/03/20) ---------------------------------------------------------------------- Anatomy  Diaphragm:             Appears normal         Kidneys:                Appear normal  Stomach:               Appears normal, left   Bladder:                Appears normal                         sided  Abdomen:               Appears normal ---------------------------------------------------------------------- Cervix Uterus Adnexa  Cervix  Length:           2.79  cm. ---------------------------------------------------------------------- Impression  Patient is being evaluated at the MAU for c/o vaginal  bleeding. She had prophylactic cerclage in this pregnancy.  A limited ultrasound study was performed .Amniotic fluid is  normal and good fetal activity is seen . Placenta appears  normal with no retroplacental clots.  On transvaginal ultrasound, the shortest cervical length  measurement is 2.5 cm and small funneling with amniotic  fluid sludge is seen. The cerclage is seen and is intact. ----------------------------------------------------------------------                  Noralee Space, MD Electronically Signed Final Report   05/23/2020 09:15 pm ----------------------------------------------------------------------  Meds ordered this encounter  Medications  . metroNIDAZOLE (FLAGYL) 500 MG tablet    Sig: Take 1 tablet (500 mg total) by mouth 2 (two) times daily.    Dispense:  14 tablet    Refill:  0    Order Specific Question:   Supervising Provider    Answer:   Levie Heritage [[id number]]   Assessment and Plan  --29 y.o. [phone] at [redacted]w[redacted]d  --Reassuring fetal surveillance --Cerclage in place --Pain score 0/10 throughout --One episode of bleeding, no additional episodes --Blood type B POS --No acute findings on OB MFM Limited US --Discharge home in stable condition  Calvert Cantor, CNM 05/23/2020, 7:47 PM

## 2020-05-24 LAB — GC/CHLAMYDIA PROBE AMP (~~LOC~~) NOT AT ARMC
Chlamydia: NEGATIVE
Comment: NEGATIVE
Comment: NORMAL
Neisseria Gonorrhea: NEGATIVE

## 2020-05-28 NOTE — L&D Delivery Note (Signed)
Patient was C/C/+4 and pushed for 2 minutes with epidural.    NSVD  female infant, Apgars 8,9, weight P.   The patient had a 1 cm first degree perineal laceration repaired with 2-0 vicryl in one stitch. Fundus was firm. EBL was expected amount. Placenta was delivered intact. Vagina was clear.  Delayed cord clamping done for 30-60 seconds while warming baby. Baby was vigorous and doing skin to skin with mother.  Kayla [name]

## 2020-05-29 ENCOUNTER — Encounter (HOSPITAL_COMMUNITY): Payer: Self-pay

## 2020-05-29 ENCOUNTER — Ambulatory Visit (HOSPITAL_COMMUNITY)
Admission: RE | Admit: 2020-05-29 | Discharge: 2020-05-29 | Disposition: A | Discharge status: Home / Self Care | Payer: Medicaid Other | Source: Ambulatory Visit

## 2020-05-29 ENCOUNTER — Other Ambulatory Visit: Payer: Self-pay

## 2020-05-29 VITALS — BP 129/82 | HR 109 | Resp 19

## 2020-05-29 DIAGNOSIS — H66005 Acute suppurative otitis media without spontaneous rupture of ear drum, recurrent, left ear: Secondary | ICD-10-CM

## 2020-05-29 MED ORDER — CEFDINIR 300 MG PO CAPS: 300.0000 mg | ORAL_CAPSULE | Freq: Two times a day (BID) | ORAL | 0 refills | Status: AC

## 2020-05-29 NOTE — ED Provider Notes (Signed)
[facility]    CSN: [id number] Arrival date & time: 05/29/20  1649      History   Chief Complaint Chief Complaint  Patient presents with  . Otalgia  . Headache    HPI Kayla [name] is a 30 y.o. female.   HPI   30 year old female here for evaluation of left ear pain and headache.  Patient is currently [redacted] weeks pregnant and here for complaint of left ear pain and left-sided headache that has been going on since before Christmas.  Patient was evaluated at an urgent care and diagnosed with a middle ear infection that was treated with eardrops.  Patient reports that her symptoms have not improved.  Patient reports that she is having muffled hearing in that ear.  Patient denies fever, ringing in her ears, sore throat, runny nose, or pain in her jaw.   Past Medical History:  Diagnosis Date  . Hernia of abdominal wall 2019  . History of frequent urinary tract infections   . Pregnancy induced hypertension   . Preterm labor     Patient Active Problem List   Diagnosis Date Noted  . Preterm labor 08/15/2018  . Preterm premature rupture of membranes (PPROM) with unknown onset of labor 08/15/2018  . Postoperative state 07/03/2018  . Diastasis recti 12/12/2017  . Class 1 obesity with body mass index (BMI) of 32.0 to 32.9 in adult 12/07/2016  . Pregnancy 09/01/2015  . Spontaneous vaginal delivery 09/01/2015  . Abdominal pain affecting pregnancy, antepartum 01/06/2015  . Family history of early CAD 23-Feb-2014  . Family history of early death 02-23-2014  . Routine gynecological examination 10/01/2012  . Unspecified symptom associated with female genital organs 10/01/2012  . Contraception management 10/01/2012    Past Surgical History:  Procedure Laterality Date  . CERVICAL CERCLAGE N/A 07/03/2018   Procedure: CERCLAGE CERVICAL;  Surgeon: Carrington Clamp, MD;  Location: [facility] BIRTHING SUITES;  Service: Gynecology;  Laterality: N/A;  . CERVICAL CERCLAGE N/A  03/18/2020   Procedure: CERCLAGE CERVICAL;  Surgeon: Waynard Reeds, MD;  Location: [facility] LD [facility];  Service: Gynecology;  Laterality: N/A;  . NO PAST SURGERIES      OB History    Gravida  4   Para  3   Term  2   Preterm  1   AB  0   Living  3     SAB  0   IAB  0   Ectopic  0   Multiple  0   Live Births  3            Home Medications    Prior to Admission medications   Medication Sig Start Date End Date Taking? Authorizing Provider  acetaminophen (TYLENOL) 325 MG tablet Take 650 mg by mouth every 6 (six) hours as needed.   Yes [provider]  cefdinir (OMNICEF) 300 MG capsule Take 1 capsule (300 mg total) by mouth 2 (two) times daily for 10 days. 05/29/20 06/08/20 Yes Becky Augusta, NP  metroNIDAZOLE (FLAGYL) 500 MG tablet Take 1 tablet (500 mg total) by mouth 2 (two) times daily. 05/23/20   Calvert Cantor, CNM  neomycin-polymyxin-hydrocortisone (CORTISPORIN) 3.5-10000-1 OTIC suspension Place 3 drops into the left ear 3 (three) times daily. 05/19/20   Hall-Potvin, [place], PA-C  Prenatal Vit-Fe Fumarate-FA (PRENATAL VITAMINS PO) Take 1 tablet by mouth daily.    [provider]    Family History Family History  Problem Relation Age of Onset  . Heart disease Father 60  AMI  . Cancer Neg Hx   . Diabetes Neg Hx   . Stroke Neg Hx     Social History Social History   Tobacco Use  . Smoking status: Never Smoker  . Smokeless tobacco: Never Used  Vaping Use  . Vaping Use: Never used  Substance Use Topics  . Alcohol use: Not Currently  . Drug use: No     Allergies   Penicillins   Review of Systems Review of Systems  HENT: Positive for ear pain. Negative for ear discharge, rhinorrhea and sore throat.   Skin: Negative for rash.  Neurological: Positive for headaches. Negative for dizziness.  Hematological: Negative.   Psychiatric/Behavioral: Negative.      Physical Exam Triage Vital Signs ED Triage Vitals  Enc Vitals Group      BP 05/29/20 1712 129/82     Pulse Rate 05/29/20 1712 (!) 109     Resp 05/29/20 1712 19     Temp --      Temp Source 05/29/20 1712 Oral     SpO2 05/29/20 1712 97 %     Weight --      Height --      Head Circumference --      Peak Flow --      Pain Score 05/29/20 1711 8     Pain Loc --      Pain Edu? --      Excl. in [place]? --    No data found.  Updated Vital Signs BP 129/82 (BP Location: Left Arm)   Pulse (!) 109   Resp 19   LMP 12/16/2019   SpO2 97%   Visual Acuity Right Eye Distance:   Left Eye Distance:   Bilateral Distance:    Right Eye Near:   Left Eye Near:    Bilateral Near:     Physical Exam Vitals and nursing note reviewed.  Constitutional:      General: She is not in acute distress.    Appearance: She is well-developed. She is not toxic-appearing.  HENT:     Head: Normocephalic and atraumatic.     Right Ear: Tympanic membrane, ear canal and external ear normal.     Left Ear: Ear canal and external ear normal.     Ears:     Comments: Left TM is erythematous and injected with loss of landmarks. Cardiovascular:     Rate and Rhythm: Normal rate and regular rhythm.     Pulses: Normal pulses.     Heart sounds: Normal heart sounds. No murmur heard. No gallop.   Pulmonary:     Effort: Pulmonary effort is normal.     Breath sounds: Normal breath sounds. No wheezing, rhonchi or rales.  Skin:    General: Skin is warm and dry.     Capillary Refill: Capillary refill takes less than 2 seconds.  Neurological:     General: No focal deficit present.     Mental Status: She is alert and oriented to person, place, and time.  Psychiatric:        Mood and Affect: Mood normal.        Behavior: Behavior normal.        Thought Content: Thought content normal.        Judgment: Judgment normal.      UC Treatments / Results  Labs (all labs ordered are listed, but only abnormal results are displayed) Labs Reviewed - No data to display  EKG   Radiology No  results  found.  Procedures Procedures (including critical care time)  Medications Ordered in UC Medications - No data to display  Initial Impression / Assessment and Plan / UC Course  I have reviewed the triage vital signs and the nursing notes.  Pertinent labs & imaging results that were available during my care of the patient were reviewed by me and considered in my medical decision making (see chart for details).   For evaluation of continued left ear pain.  Patient was diagnosed with a left otitis media and treated with eardrops for Christmas.  At that time she was prescribed Cortisporin otic.  Visual exam reveals a erythematous and injected left tympanic membrane that is consistent with otitis media.  Will treat with cefdinir 300 mg twice daily x10 days.   Final Clinical Impressions(s) / UC Diagnoses   Final diagnoses:  Recurrent acute suppurative otitis media without spontaneous rupture of left tympanic membrane     Discharge Instructions     Cefdinir twice daily for 10 days.  Use Tylenol as needed for pain control.  You can also place a heating pad underneath your pillowcase or a hot water bottle to help soothe your ear.  If your symptoms do not improve follow-up with your primary care provider.    ED Prescriptions    Medication Sig Dispense Auth. Provider   cefdinir (OMNICEF) 300 MG capsule Take 1 capsule (300 mg total) by mouth 2 (two) times daily for 10 days. 20 capsule Becky Augusta, NP     PDMP not reviewed this encounter.   Becky Augusta, NP 05/29/20 1740

## 2020-05-29 NOTE — ED Triage Notes (Signed)
Pt presents with left ear pain and headache x 1 week. Denies fever, nasal congestion, sore throat.    Pt do not want a COVID test.

## 2020-05-29 NOTE — Discharge Instructions (Signed)
Cefdinir twice daily for 10 days.  Use Tylenol as needed for pain control.  You can also place a heating pad underneath your pillowcase or a hot water bottle to help soothe your ear.  If your symptoms do not improve follow-up with your primary care provider.

## 2020-07-04 ENCOUNTER — Inpatient Hospital Stay (HOSPITAL_COMMUNITY): Payer: Medicaid Other

## 2020-07-04 ENCOUNTER — Other Ambulatory Visit: Payer: Self-pay

## 2020-07-04 ENCOUNTER — Inpatient Hospital Stay (HOSPITAL_BASED_OUTPATIENT_CLINIC_OR_DEPARTMENT_OTHER): Payer: Medicaid Other

## 2020-07-04 ENCOUNTER — Inpatient Hospital Stay (HOSPITAL_COMMUNITY)
Admission: AD | Admit: 2020-07-04 | Discharge: 2020-07-04 | Disposition: A | Discharge status: Home / Self Care | Payer: Medicaid Other | Attending: Obstetrics | Admitting: Obstetrics

## 2020-07-04 DIAGNOSIS — O09213 Supervision of pregnancy with history of pre-term labor, third trimester: Secondary | ICD-10-CM | POA: Diagnosis not present

## 2020-07-04 DIAGNOSIS — Z3A30 30 weeks gestation of pregnancy: Secondary | ICD-10-CM | POA: Diagnosis not present

## 2020-07-04 DIAGNOSIS — Z8742 Personal history of other diseases of the female genital tract: Secondary | ICD-10-CM | POA: Diagnosis not present

## 2020-07-04 DIAGNOSIS — O3433 Maternal care for cervical incompetence, third trimester: Secondary | ICD-10-CM

## 2020-07-04 DIAGNOSIS — O99213 Obesity complicating pregnancy, third trimester: Secondary | ICD-10-CM | POA: Insufficient documentation

## 2020-07-04 DIAGNOSIS — Z3689 Encounter for other specified antenatal screening: Secondary | ICD-10-CM

## 2020-07-04 DIAGNOSIS — O26893 Other specified pregnancy related conditions, third trimester: Secondary | ICD-10-CM

## 2020-07-04 DIAGNOSIS — O4693 Antepartum hemorrhage, unspecified, third trimester: Secondary | ICD-10-CM | POA: Diagnosis present

## 2020-07-04 DIAGNOSIS — E669 Obesity, unspecified: Secondary | ICD-10-CM

## 2020-07-04 DIAGNOSIS — O09293 Supervision of pregnancy with other poor reproductive or obstetric history, third trimester: Secondary | ICD-10-CM | POA: Diagnosis not present

## 2020-07-04 LAB — WET PREP, GENITAL
Clue Cells Wet Prep HPF POC: NONE SEEN
Sperm: NONE SEEN
Trich, Wet Prep: NONE SEEN
Yeast Wet Prep HPF POC: NONE SEEN

## 2020-07-04 LAB — URINALYSIS, ROUTINE W REFLEX MICROSCOPIC
Bilirubin Urine: NEGATIVE
Glucose, UA: 50 mg/dL — AB
Ketones, ur: 5 mg/dL — AB
Nitrite: NEGATIVE
Protein, ur: 30 mg/dL — AB
Specific Gravity, Urine: 1.028 (ref 1.005–1.030)
WBC, UA: >50 WBC/hpf — ABNORMAL HIGH (ref 0–5)
pH: 5 (ref 5.0–8.0)

## 2020-07-04 NOTE — MAU Note (Signed)
Patient stated that Saturday she started to experience vaginal bleeding that was bright red after wiping. She stated she had cramping yesterday. The bleeding has been spotting that she visualizes after wiping. No LOF, pt stated positive fetal movement. efm commenced.

## 2020-07-04 NOTE — MAU Note (Signed)
Kayla [name] is a 30 y.o. at [redacted]w[redacted]d here in MAU reporting: since Saturday night has been seeing vaginal bleeding. Seeing it just when she wipes and it is light red. Having intermittent cramping, happens about 3-4 times per day. No LOF. Has a cerclage  Onset of complaint: ongoing  Pain score: 0/10  Vitals:   07/04/20 1558  BP: 137/86  Pulse: (!) 122  Resp: 16  Temp: 98.8 F (37.1 C)  SpO2: 100%     FHT: +FM  Lab orders placed from triage: UA

## 2020-07-04 NOTE — MAU Provider Note (Signed)
History     CSN: [id number]  Arrival date and time: 07/04/20 1530   Event Date/Time   First Provider Initiated Contact with Patient 07/04/20 1650      Chief Complaint  Patient presents with  . Vaginal Bleeding   HPI This is a 30 yo G4P2103 at [redacted]w[redacted]d who presents with vaginal bleeding. She has a history of preterm labor and cervical incompetence. She has a cerclage in place. She presents with vaginal bleeding that started Saturday. Since then, she's had steady spotting. No palliating or provoking factors. Denies contractions, decreased movement, sexual intercourse. She does have an increased discharge.  OB History    Gravida  4   Para  3   Term  2   Preterm  1   AB  0   Living  3     SAB  0   IAB  0   Ectopic  0   Multiple  0   Live Births  3           Past Medical History:  Diagnosis Date  . Hernia of abdominal wall 2019  . History of frequent urinary tract infections   . Pregnancy induced hypertension   . Preterm labor     Past Surgical History:  Procedure Laterality Date  . CERVICAL CERCLAGE N/A 07/03/2018   Procedure: CERCLAGE CERVICAL;  Surgeon: Carrington Clamp, MD;  Location: [facility] BIRTHING SUITES;  Service: Gynecology;  Laterality: N/A;  . CERVICAL CERCLAGE N/A 03/18/2020   Procedure: CERCLAGE CERVICAL;  Surgeon: Waynard Reeds, MD;  Location: [facility] LD ORS;  Service: Gynecology;  Laterality: N/A;  . NO PAST SURGERIES      Family History  Problem Relation Age of Onset  . Heart disease Father 30       AMI  . Cancer Neg Hx   . Diabetes Neg Hx   . Stroke Neg Hx     Social History   Tobacco Use  . Smoking status: Never Smoker  . Smokeless tobacco: Never Used  Vaping Use  . Vaping Use: Never used  Substance Use Topics  . Alcohol use: Not Currently  . Drug use: No    Allergies:  Allergies  Allergen Reactions  . Penicillins Itching and Rash    Has patient had a PCN reaction causing immediate rash, facial/tongue/throat swelling, SOB or  lightheadedness with hypotension: No Has patient had a PCN reaction causing severe rash involving mucus membranes or skin necrosis: No Has patient had a PCN reaction that required hospitalization: No Has patient had a PCN reaction occurring within the last 10 years: No If all of the above answers are "NO", then may proceed with Cephalosporin use.     Medications Prior to Admission  Medication Sig Dispense Refill Last Dose  . acetaminophen (TYLENOL) 325 MG tablet Take 650 mg by mouth every 6 (six) hours as needed.   07/03/2020 at Unknown time  . Prenatal Vit-Fe Fumarate-FA (PRENATAL VITAMINS PO) Take 1 tablet by mouth daily.   07/04/2020 at Unknown time  . metroNIDAZOLE (FLAGYL) 500 MG tablet Take 1 tablet (500 mg total) by mouth 2 (two) times daily. 14 tablet 0   . neomycin-polymyxin-hydrocortisone (CORTISPORIN) 3.5-10000-1 OTIC suspension Place 3 drops into the left ear 3 (three) times daily. 10 mL 0     Review of Systems Physical Exam   Blood pressure 137/86, pulse (!) 122, temperature 98.8 F (37.1 C), temperature source Oral, resp. rate 16, height 5\' 1"  (1.549 m), weight 83.2 kg, last menstrual period  12/16/2019, SpO2 100 %, unknown if currently breastfeeding.  Physical Exam Vitals reviewed. Exam conducted with a chaperone present.  Constitutional:      Appearance: Normal appearance.  HENT:     Head: Normocephalic and atraumatic.  Cardiovascular:     Rate and Rhythm: Normal rate and regular rhythm.     Pulses: Normal pulses.  Abdominal:     General: Abdomen is flat. There is no distension.     Palpations: Abdomen is soft. There is no mass.     Tenderness: There is no abdominal tenderness. There is no guarding or rebound.     Hernia: No hernia is present.  Genitourinary:    Labia:        Right: No rash, tenderness or lesion.        Left: No rash, tenderness or lesion.      Urethra: No prolapse or urethral swelling.     Vagina: No signs of injury and foreign body. No vaginal  discharge, erythema, tenderness or bleeding.     Cervix: No cervical motion tenderness, discharge, friability, lesion or erythema.     Comments: No blood from os. Slight bleeding from cerclage with speculum removal. Skin:    General: Skin is warm and dry.     Capillary Refill: Capillary refill takes less than 2 seconds.  Neurological:     General: No focal deficit present.     Mental Status: She is alert.  Psychiatric:        Mood and Affect: Mood normal.        Behavior: Behavior normal.        Thought Content: Thought content normal.        Judgment: Judgment normal.    Results for orders placed or performed during the hospital encounter of 07/04/20 (from the past 24 hour(s))  Urinalysis, Routine w reflex microscopic Urine, Clean Catch     Status: Abnormal   Collection Time: 07/04/20  3:56 PM  Result Value Ref Range   Color, Urine YELLOW YELLOW   APPearance HAZY (A) CLEAR   Specific Gravity, Urine 1.028 1.005 - 1.030   pH 5.0 5.0 - 8.0   Glucose, UA 50 (A) NEGATIVE mg/dL   Hgb urine dipstick MODERATE (A) NEGATIVE   Bilirubin Urine NEGATIVE NEGATIVE   Ketones, ur 5 (A) NEGATIVE mg/dL   Protein, ur 30 (A) NEGATIVE mg/dL   Nitrite NEGATIVE NEGATIVE   Leukocytes,Ua LARGE (A) NEGATIVE   RBC / HPF 0-5 0 - 5 RBC/hpf   WBC, UA >50 (H) 0 - 5 WBC/hpf   Bacteria, UA RARE (A) NONE SEEN   Squamous Epithelial / LPF 0-5 0 - 5   Mucus PRESENT    Hyaline Casts, UA PRESENT    Ca Oxalate Crys, UA PRESENT   Wet prep, genital     Status: Abnormal   Collection Time: 07/04/20  4:54 PM   Specimen: Genital  Result Value Ref Range   Yeast Wet Prep HPF POC NONE SEEN NONE SEEN   Trich, Wet Prep NONE SEEN NONE SEEN   Clue Cells Wet Prep HPF POC NONE SEEN NONE SEEN   WBC, Wet Prep HPF POC MANY (A) NONE SEEN   Sperm NONE SEEN     MAU Course  Procedures NST: 140, moderate variability, + accel, no decels. No contractions  MDM Reviewed images of [place] - no obvious abruption No evidence of  preterm labor. Good cervical length on [place].  Assessment and Plan     ICD-10-CM  1. [redacted] weeks gestation of pregnancy  Z3A.30   2. Vaginal bleeding in pregnancy, third trimester  O46.93 [place] MFM OB LIMITED    [place] MFM OB LIMITED    [place] MFM OB Transvaginal    [place] MFM OB Transvaginal    CANCELED: [place] MFM OB Transvaginal    CANCELED: [place] MFM OB Transvaginal  3. History of cervical incompetence  Z87.42   4. Cervical cerclage suture present in third trimester  O34.33   5. NST (non-stress test) reactive  Z36.89    No evidence of placental abruption, based on bleeding from top of cervix by the cerclage. No evidence of PTL.  Discussed with Dr Chestine Spore - will discharge to home has f/u with their office tomorrow.  Levie Heritage 07/04/2020, 4:50 PM

## 2020-07-04 NOTE — Discharge Instructions (Signed)
Return with any further bleeding.

## 2020-07-05 LAB — GC/CHLAMYDIA PROBE AMP (~~LOC~~) NOT AT ARMC
Chlamydia: NEGATIVE
Comment: NEGATIVE
Comment: NORMAL
Neisseria Gonorrhea: NEGATIVE

## 2020-08-18 LAB — OB RESULTS CONSOLE GBS: GBS: NEGATIVE

## 2020-08-31 ENCOUNTER — Other Ambulatory Visit (HOSPITAL_COMMUNITY)
Admission: RE | Admit: 2020-08-31 | Discharge: 2020-08-31 | Disposition: A | Discharge status: Home / Self Care | Payer: 59 | Source: Ambulatory Visit | Attending: Obstetrics and Gynecology | Admitting: Obstetrics and Gynecology

## 2020-08-31 DIAGNOSIS — Z01812 Encounter for preprocedural laboratory examination: Secondary | ICD-10-CM | POA: Insufficient documentation

## 2020-08-31 DIAGNOSIS — Z20822 Contact with and (suspected) exposure to covid-19: Secondary | ICD-10-CM | POA: Insufficient documentation

## 2020-08-31 LAB — SARS CORONAVIRUS 2 (TAT 6-24 HRS): SARS Coronavirus 2: NEGATIVE

## 2020-09-01 ENCOUNTER — Inpatient Hospital Stay (HOSPITAL_COMMUNITY): Payer: 59

## 2020-09-01 ENCOUNTER — Inpatient Hospital Stay (HOSPITAL_COMMUNITY)
Admission: AD | Admit: 2020-09-01 | Discharge: 2020-09-04 | DRG: 807 | Disposition: A | Discharge status: Home / Self Care | Payer: 59 | Attending: Obstetrics and Gynecology | Admitting: Obstetrics and Gynecology

## 2020-09-01 ENCOUNTER — Encounter (HOSPITAL_COMMUNITY): Payer: Self-pay | Admitting: Obstetrics and Gynecology

## 2020-09-01 ENCOUNTER — Other Ambulatory Visit: Payer: Self-pay

## 2020-09-01 DIAGNOSIS — O10919 Unspecified pre-existing hypertension complicating pregnancy, unspecified trimester: Secondary | ICD-10-CM | POA: Diagnosis present

## 2020-09-01 DIAGNOSIS — O99214 Obesity complicating childbirth: Secondary | ICD-10-CM | POA: Diagnosis present

## 2020-09-01 DIAGNOSIS — O1002 Pre-existing essential hypertension complicating childbirth: Principal | ICD-10-CM | POA: Diagnosis present

## 2020-09-01 DIAGNOSIS — Z3A38 38 weeks gestation of pregnancy: Secondary | ICD-10-CM | POA: Diagnosis not present

## 2020-09-01 DIAGNOSIS — Z20822 Contact with and (suspected) exposure to covid-19: Secondary | ICD-10-CM | POA: Diagnosis present

## 2020-09-01 LAB — CBC
HCT: 38.6 % (ref 36.0–46.0)
Hemoglobin: 13.1 g/dL (ref 12.0–15.0)
MCH: 28.8 pg (ref 26.0–34.0)
MCHC: 33.9 g/dL (ref 30.0–36.0)
MCV: 84.8 fL (ref 80.0–100.0)
Platelets: 256 10*3/uL (ref 150–400)
RBC: 4.55 MIL/uL (ref 3.87–5.11)
RDW: 13.8 % (ref 11.5–15.5)
WBC: 14.3 10*3/uL — ABNORMAL HIGH (ref 4.0–10.5)
nRBC: 0 % (ref 0.0–0.2)

## 2020-09-01 LAB — COMPREHENSIVE METABOLIC PANEL
ALT: 11 U/L (ref 0–44)
AST: 20 U/L (ref 15–41)
Albumin: 3.1 g/dL — ABNORMAL LOW (ref 3.5–5.0)
Alkaline Phosphatase: 150 U/L — ABNORMAL HIGH (ref 38–126)
Anion gap: 10 (ref 5–15)
BUN: <5 mg/dL — ABNORMAL LOW (ref 6–20)
CO2: 18 mmol/L — ABNORMAL LOW (ref 22–32)
Calcium: 9.3 mg/dL (ref 8.9–10.3)
Chloride: 105 mmol/L (ref 98–111)
Creatinine, Ser: 0.59 mg/dL (ref 0.44–1.00)
GFR, Estimated: >60 mL/min (ref >60)
Glucose, Bld: 76 mg/dL (ref 70–99)
Potassium: 4.4 mmol/L (ref 3.5–5.1)
Sodium: 133 mmol/L — ABNORMAL LOW (ref 135–145)
Total Bilirubin: 0.3 mg/dL (ref 0.3–1.2)
Total Protein: 6.2 g/dL — ABNORMAL LOW (ref 6.5–8.1)

## 2020-09-01 LAB — TYPE AND SCREEN
ABO/RH(D): B POS
Antibody Screen: NEGATIVE

## 2020-09-01 LAB — RPR: RPR Ser Ql: NONREACTIVE

## 2020-09-01 LAB — PROTEIN / CREATININE RATIO, URINE
Creatinine, Urine: 98.3 mg/dL
Protein Creatinine Ratio: 0.14 mg/mg{Cre} (ref 0.00–0.15)
Total Protein, Urine: 14 mg/dL

## 2020-09-01 MED ORDER — ONDANSETRON HCL 4 MG/2ML IJ SOLN: 4.0000 mg | Freq: Four times a day (QID) | INTRAMUSCULAR | Status: DC | PRN

## 2020-09-01 MED ORDER — OXYTOCIN BOLUS FROM INFUSION
333.0000 mL | Freq: Once | INTRAVENOUS | Status: AC
  Administered 2020-09-02: 333 mL via INTRAVENOUS

## 2020-09-01 MED ORDER — OXYCODONE-ACETAMINOPHEN 5-325 MG PO TABS: 1.0000 | ORAL_TABLET | ORAL | Status: DC | PRN

## 2020-09-01 MED ORDER — EPHEDRINE 5 MG/ML INJ: 10.0000 mg | INTRAVENOUS | Status: DC | PRN

## 2020-09-01 MED ORDER — OXYCODONE-ACETAMINOPHEN 5-325 MG PO TABS: 2.0000 | ORAL_TABLET | ORAL | Status: DC | PRN

## 2020-09-01 MED ORDER — LIDOCAINE HCL (PF) 1 % IJ SOLN: 30.0000 mL | INTRAMUSCULAR | Status: DC | PRN

## 2020-09-01 MED ORDER — ACETAMINOPHEN 325 MG PO TABS: 650.0000 mg | ORAL_TABLET | ORAL | Status: DC | PRN

## 2020-09-01 MED ORDER — LACTATED RINGERS IV SOLN
500.0000 mL | Freq: Once | INTRAVENOUS | Status: AC
  Administered 2020-09-02: 500 mL via INTRAVENOUS

## 2020-09-01 MED ORDER — FENTANYL CITRATE (PF) 100 MCG/2ML IJ SOLN: 50.0000 ug | INTRAMUSCULAR | Status: DC | PRN

## 2020-09-01 MED ORDER — LACTATED RINGERS IV SOLN: INTRAVENOUS | Status: DC

## 2020-09-01 MED ORDER — SOD CITRATE-CITRIC ACID 500-334 MG/5ML PO SOLN: 30.0000 mL | ORAL | Status: DC | PRN

## 2020-09-01 MED ORDER — MISOPROSTOL 25 MCG QUARTER TABLET
25.0000 ug | ORAL_TABLET | ORAL | Status: DC | PRN
  Administered 2020-09-01 – 2020-09-02 (×3): 25 ug via VAGINAL
  Filled 2020-09-01 (×3): qty 1

## 2020-09-01 MED ORDER — LACTATED RINGERS IV SOLN
500.0000 mL | INTRAVENOUS | Status: DC | PRN
Start: 2020-09-01 — End: 2020-09-02

## 2020-09-01 MED ORDER — PHENYLEPHRINE 40 MCG/ML (10ML) SYRINGE FOR IV PUSH (FOR BLOOD PRESSURE SUPPORT): 80.0000 ug | PREFILLED_SYRINGE | INTRAVENOUS | Status: DC | PRN

## 2020-09-01 MED ORDER — OXYTOCIN-SODIUM CHLORIDE 30-0.9 UT/500ML-% IV SOLN
1.0000 m[IU]/min | INTRAVENOUS | Status: DC
  Administered 2020-09-01: 2 m[IU]/min via INTRAVENOUS
  Filled 2020-09-01: qty 500

## 2020-09-01 MED ORDER — OXYTOCIN-SODIUM CHLORIDE 30-0.9 UT/500ML-% IV SOLN: 2.5000 [IU]/h | INTRAVENOUS | Status: DC

## 2020-09-01 MED ORDER — TERBUTALINE SULFATE 1 MG/ML IJ SOLN: 0.2500 mg | Freq: Once | INTRAMUSCULAR | Status: DC | PRN

## 2020-09-01 MED ORDER — FENTANYL-BUPIVACAINE-NACL 0.5-0.125-0.9 MG/250ML-% EP SOLN
12.0000 mL/h | EPIDURAL | Status: DC | PRN
  Administered 2020-09-02: 12 mL/h via EPIDURAL
  Filled 2020-09-01: qty 250

## 2020-09-01 MED ORDER — DIPHENHYDRAMINE HCL 50 MG/ML IJ SOLN
12.5000 mg | INTRAMUSCULAR | Status: DC | PRN
Start: 2020-09-01 — End: 2020-09-02

## 2020-09-01 NOTE — Progress Notes (Addendum)
S: Patient reports mild discomfort with contractions  BP 143/90  SVE 2/thick/-3/posterior  FHR 135bpm, moderate varibaility, + accels, no decels Toco: ctx q 2-4 min  A/P 29Y [id number] @ [redacted]w[redacted]d, IOL chronic hypertension 1. IOL: no cervical change despite regular contractions with 8 hours of pitocin, unable to safely AROM. Will stop pitocin, allow light meal, and proceed with cytotec. 2. CHTN: mild range Bps, admission CBC/CMP/UP:C WNL. Monitor for signs/symptoms of preeclampsia 3. Pain control: epidural upon patient request  M. Timothy Lasso, MD 09/01/20 5:44 PM

## 2020-09-01 NOTE — H&P (Signed)
Kayla [name] is a 30 y.o. female [phone] [redacted]w[redacted]d presenting for IOL for chronic hypertension. She reports no LOF, VB, Contractions. Normal FM.   Pregnancy c/b: 1. Chronic hypertension: no meds, normal to mild range Bps throughout pregnancy, diagnosed in 1st trimester 2. Cervical insufficiency: s/p cerclage, removed 08/18/20 3. Obesity: prepregnancy BMI 32.6  OB History    Gravida  4   Para  3   Term  2   Preterm  1   AB  0   Living  3     SAB  0   IAB  0   Ectopic  0   Multiple  0   Live Births  3          Past Medical History:  Diagnosis Date  . Hernia of abdominal wall 2019  . History of frequent urinary tract infections   . Pregnancy induced hypertension   . Preterm labor    Past Surgical History:  Procedure Laterality Date  . CERVICAL CERCLAGE N/A 07/03/2018   Procedure: CERCLAGE CERVICAL;  Surgeon: Carrington Clamp, MD;  Location: [facility] BIRTHING SUITES;  Service: Gynecology;  Laterality: N/A;  . CERVICAL CERCLAGE N/A 03/18/2020   Procedure: CERCLAGE CERVICAL;  Surgeon: Waynard Reeds, MD;  Location: [facility] LD ORS;  Service: Gynecology;  Laterality: N/A;  . NO PAST SURGERIES     Family History: family history includes Heart disease (age of onset: 64) in her father. Social History:  reports that she has never smoked. She has never used smokeless tobacco. She reports previous alcohol use. She reports that she does not use drugs.     Maternal Diabetes: No Genetic Screening: Normal Maternal Ultrasounds/Referrals: Normal Fetal Ultrasounds or other Referrals:  Growth [place] 07/22/20: 55% (4lb11oz) Maternal Substance Abuse:  No Significant Maternal Medications:  None Significant Maternal Lab Results:  Group B Strep negative Other Comments:  None  Review of Systems Per HPI Exam Physical Exam  Dilation: 2 Effacement (%): 80 Station: -3 Exam by:: Adline Potter, RN Height 5\' 1"  (1.549 m), weight 87 kg, last menstrual period 12/16/2019, unknown if currently  breastfeeding. NAD, resting comfortably Gravid abdomen, Leopolds 7# Fetal testing: FHR 135bpm, moderate variability, + accels, no decels Toco: irregular (not felt by patient Prenatal labs: ABO, Rh:  --/--/B POS (10/22 1010) Antibody: NEG (10/22 1010) Rubella: Immune (09/30 0000) RPR: Nonreactive (09/30 0000)  HBsAg: Negative (09/30 0000)  HIV: Non-reactive (09/30 0000)  GBS: Negative/-- (03/24 0000)   Assessment/Plan: 11-06-1983 @ [redacted]w[redacted]d, IOL chronic hypertension 1. IOL: start pitocin, titrate per protocol. Will plan to AROM when fetal station appropriate 2. CHTN: mild range Bps, admission CBC/CMP/UP:C pending. Monitor for signs/symptoms of preeclampsia. Consider antihypertensives PRN 3. Pain control: epidural upon patient request     [redacted]w[redacted]d 09/01/2020, 9:14 AM

## 2020-09-02 ENCOUNTER — Encounter (HOSPITAL_COMMUNITY): Payer: Self-pay | Admitting: Obstetrics and Gynecology

## 2020-09-02 ENCOUNTER — Inpatient Hospital Stay (HOSPITAL_COMMUNITY): Payer: 59 | Admitting: Anesthesiology

## 2020-09-02 LAB — CBC
HCT: 39.3 % (ref 36.0–46.0)
Hemoglobin: 12.7 g/dL (ref 12.0–15.0)
MCH: 27.9 pg (ref 26.0–34.0)
MCHC: 32.3 g/dL (ref 30.0–36.0)
MCV: 86.2 fL (ref 80.0–100.0)
Platelets: 245 10*3/uL (ref 150–400)
RBC: 4.56 MIL/uL (ref 3.87–5.11)
RDW: 14 % (ref 11.5–15.5)
WBC: 13.1 10*3/uL — ABNORMAL HIGH (ref 4.0–10.5)
nRBC: 0 % (ref 0.0–0.2)

## 2020-09-02 MED ORDER — IBUPROFEN 800 MG PO TABS
800.0000 mg | ORAL_TABLET | Freq: Three times a day (TID) | ORAL | Status: DC
  Administered 2020-09-02 – 2020-09-04 (×6): 800 mg via ORAL
  Filled 2020-09-02 (×6): qty 1

## 2020-09-02 MED ORDER — SODIUM CHLORIDE 0.9 % IV SOLN: 250.0000 mL | INTRAVENOUS | Status: DC | PRN

## 2020-09-02 MED ORDER — METHYLERGONOVINE MALEATE 0.2 MG PO TABS: 0.2000 mg | ORAL_TABLET | ORAL | Status: DC | PRN

## 2020-09-02 MED ORDER — METHYLERGONOVINE MALEATE 0.2 MG/ML IJ SOLN
0.2000 mg | INTRAMUSCULAR | Status: DC | PRN
Start: 2020-09-02 — End: 2020-09-04

## 2020-09-02 MED ORDER — MISOPROSTOL 200 MCG PO TABS
ORAL_TABLET | ORAL | Status: AC
  Filled 2020-09-02: qty 5

## 2020-09-02 MED ORDER — SIMETHICONE 80 MG PO CHEW: 80.0000 mg | CHEWABLE_TABLET | ORAL | Status: DC | PRN

## 2020-09-02 MED ORDER — SODIUM CHLORIDE 0.9% FLUSH: 3.0000 mL | INTRAVENOUS | Status: DC | PRN

## 2020-09-02 MED ORDER — COCONUT OIL OIL: 1.0000 "application " | TOPICAL_OIL | Status: DC | PRN

## 2020-09-02 MED ORDER — FERROUS SULFATE 325 (65 FE) MG PO TABS
325.0000 mg | ORAL_TABLET | Freq: Two times a day (BID) | ORAL | Status: DC
  Administered 2020-09-03 – 2020-09-04 (×2): 325 mg via ORAL
  Filled 2020-09-02 (×2): qty 1

## 2020-09-02 MED ORDER — MAGNESIUM HYDROXIDE 400 MG/5ML PO SUSP: 30.0000 mL | ORAL | Status: DC | PRN

## 2020-09-02 MED ORDER — WITCH HAZEL-GLYCERIN EX PADS: 1.0000 "application " | MEDICATED_PAD | CUTANEOUS | Status: DC | PRN

## 2020-09-02 MED ORDER — ACETAMINOPHEN 325 MG PO TABS: 650.0000 mg | ORAL_TABLET | ORAL | Status: DC | PRN

## 2020-09-02 MED ORDER — SENNOSIDES-DOCUSATE SODIUM 8.6-50 MG PO TABS
2.0000 | ORAL_TABLET | Freq: Every day | ORAL | Status: DC
  Administered 2020-09-03 – 2020-09-04 (×2): 2 via ORAL
  Filled 2020-09-02 (×2): qty 2

## 2020-09-02 MED ORDER — BUTALBITAL-APAP-CAFFEINE 50-325-40 MG PO TABS: 1.0000 | ORAL_TABLET | Freq: Four times a day (QID) | ORAL | Status: DC | PRN

## 2020-09-02 MED ORDER — OXYCODONE-ACETAMINOPHEN 5-325 MG PO TABS: 2.0000 | ORAL_TABLET | ORAL | Status: DC | PRN

## 2020-09-02 MED ORDER — LIDOCAINE HCL (PF) 1 % IJ SOLN
INTRAMUSCULAR | Status: DC | PRN
  Administered 2020-09-02: 11 mL via EPIDURAL

## 2020-09-02 MED ORDER — DIPHENHYDRAMINE HCL 25 MG PO CAPS: 25.0000 mg | ORAL_CAPSULE | Freq: Four times a day (QID) | ORAL | Status: DC | PRN

## 2020-09-02 MED ORDER — DIBUCAINE (PERIANAL) 1 % EX OINT: 1.0000 "application " | TOPICAL_OINTMENT | CUTANEOUS | Status: DC | PRN

## 2020-09-02 MED ORDER — MEASLES, MUMPS & RUBELLA VAC IJ SOLR: 0.5000 mL | Freq: Once | INTRAMUSCULAR | Status: DC

## 2020-09-02 MED ORDER — ONDANSETRON HCL 4 MG/2ML IJ SOLN: 4.0000 mg | INTRAMUSCULAR | Status: DC | PRN

## 2020-09-02 MED ORDER — ZOLPIDEM TARTRATE 5 MG PO TABS: 5.0000 mg | ORAL_TABLET | Freq: Every evening | ORAL | Status: DC | PRN

## 2020-09-02 MED ORDER — SODIUM CHLORIDE 0.9% FLUSH: 3.0000 mL | Freq: Two times a day (BID) | INTRAVENOUS | Status: DC

## 2020-09-02 MED ORDER — BENZOCAINE-MENTHOL 20-0.5 % EX AERO: 1.0000 "application " | INHALATION_SPRAY | CUTANEOUS | Status: DC | PRN

## 2020-09-02 MED ORDER — PRENATAL MULTIVITAMIN CH
1.0000 | ORAL_TABLET | Freq: Every day | ORAL | Status: DC
  Administered 2020-09-03 – 2020-09-04 (×2): 1 via ORAL
  Filled 2020-09-02 (×2): qty 1

## 2020-09-02 MED ORDER — MISOPROSTOL 200 MCG PO TABS
1000.0000 ug | ORAL_TABLET | Freq: Once | ORAL | Status: AC
  Administered 2020-09-02: 1000 ug via RECTAL

## 2020-09-02 MED ORDER — TETANUS-DIPHTH-ACELL PERTUSSIS 5-2.5-18.5 LF-MCG/0.5 IM SUSY: 0.5000 mL | PREFILLED_SYRINGE | Freq: Once | INTRAMUSCULAR | Status: DC

## 2020-09-02 MED ORDER — ONDANSETRON HCL 4 MG PO TABS: 4.0000 mg | ORAL_TABLET | ORAL | Status: DC | PRN

## 2020-09-02 NOTE — Lactation Note (Signed)
This note was copied from a baby's chart. Lactation Consultation Note  Patient Name: Kayla [name] [id number] Date: 09/02/2020 Reason for consult: L&D Initial assessment;Early term 37-38.6wks Age:29 hours P4. ETI female infant. LC entered room, mom was doing STS and infant was still cuing to breastfeed. LC ask mom to do hand expression prior to latching infant at the breast, LC notice mom is slightly short shafted but her breast responds well to breast stimulation.  Mom latched infant on her left breast breast using the football hold position, infant latched with depth, sustaining latch and breastfeed for 10 minutes. Afterwards mom taught back hand expression and infant was given 3 mls of colostrum by spoon. Mom knows to breastfeed infant according to primal cues: licking , smacking , tasting, hands or fist  in mouth and rooting, STS. LC discussed infant's input and output with parents. Mom knows to call RN or LC on MBU if she needs further assistance with latching infant at the breast.  Maternal Data Has patient been taught Hand Expression?: Yes Does the patient have breastfeeding experience prior to this delivery?: Yes How long did the patient breastfeed?: Per mom, she BF 1st child for 3 months, 2nd child for one month and 3rd child she pumped only due infant being preterm born at 16 weeks and was in NICU.  Feeding Mother's Current Feeding Choice: Breast Milk and Formula  LATCH Score Latch: Grasps breast easily, tongue down, lips flanged, rhythmical sucking.  Audible Swallowing: Spontaneous and intermittent  Type of Nipple: Everted at rest and after stimulation (Mom is semi short shafted, LC ask mom to do breast stimulation and hand express prior to latching infant at the breast.)  Comfort (Breast/Nipple): Soft / non-tender  Hold (Positioning): Assistance needed to correctly position infant at breast and maintain latch.  LATCH Score: 9   Lactation Tools Discussed/Used     Interventions Interventions: Breast feeding basics reviewed;Assisted with latch;Skin to skin;Hand express;Adjust position;Breast compression;Support pillows;Position options;Expressed milk;Education  Discharge Pump: Personal WIC Program: Yes  Consult Status Consult Status: Follow-up Date: 09/03/20 Follow-up type: In-patient    Danelle Earthly 09/02/2020, 6:50 PM

## 2020-09-02 NOTE — Anesthesia Procedure Notes (Signed)
Epidural Patient location during procedure: OB Start time: 09/02/2020 8:52 AM End time: 09/02/2020 9:01 AM  Staffing Anesthesiologist: Lowella Curb, MD Performed: other anesthesia staff   Preanesthetic Checklist Completed: patient identified, IV checked, site marked, risks and benefits discussed, surgical consent, monitors and equipment checked, pre-op evaluation and timeout performed  Epidural Patient position: sitting Prep: ChloraPrep Patient monitoring: heart rate, cardiac monitor, continuous pulse ox and blood pressure Approach: midline Injection technique: LOR air  Needle:  Needle type: Tuohy  Needle gauge: 17 G Needle length: 9 cm Needle insertion depth: 6 cm Catheter type: closed end flexible Catheter size: 20 Guage Catheter at skin depth: 10 cm Test dose: negative  Assessment Events: blood not aspirated, injection not painful, no injection resistance, no paresthesia and negative IV test  Additional Notes Epidural placed by SRNA under direct supervisionReason for block:procedure for pain

## 2020-09-02 NOTE — Anesthesia Preprocedure Evaluation (Signed)
Anesthesia Evaluation  Patient identified by MRN, date of birth, ID band Patient awake    Reviewed: Allergy & Precautions, H&P , NPO status , Patient's Chart, lab work & pertinent test results  Airway Mallampati: I  TM Distance: >3 FB Neck ROM: full    Dental no notable dental hx.    Pulmonary neg pulmonary ROS,    Pulmonary exam normal breath sounds clear to auscultation       Cardiovascular hypertension, negative cardio ROS Normal cardiovascular exam     Neuro/Psych negative neurological ROS  negative psych ROS   GI/Hepatic negative GI ROS, Neg liver ROS,   Endo/Other  negative endocrine ROS  Renal/GU negative Renal ROS     Musculoskeletal   Abdominal (+) + obese,   Peds  Hematology negative hematology ROS (+)   Anesthesia Other Findings   Reproductive/Obstetrics (+) Pregnancy                             Anesthesia Physical  Anesthesia Plan  ASA: II  Anesthesia Plan: Epidural   Post-op Pain Management:    Induction:   PONV Risk Score and Plan:   Airway Management Planned:   Additional Equipment:   Intra-op Plan:   Post-operative Plan:   Informed Consent: I have reviewed the patients History and Physical, chart, labs and discussed the procedure including the risks, benefits and alternatives for the proposed anesthesia with the patient or authorized representative who has indicated his/her understanding and acceptance.       Plan Discussed with:   Anesthesia Plan Comments:         Anesthesia Quick Evaluation

## 2020-09-03 LAB — CBC
HCT: 29.4 % — ABNORMAL LOW (ref 36.0–46.0)
Hemoglobin: 9.9 g/dL — ABNORMAL LOW (ref 12.0–15.0)
MCH: 28.8 pg (ref 26.0–34.0)
MCHC: 33.7 g/dL (ref 30.0–36.0)
MCV: 85.5 fL (ref 80.0–100.0)
Platelets: 232 10*3/uL (ref 150–400)
RBC: 3.44 MIL/uL — ABNORMAL LOW (ref 3.87–5.11)
RDW: 13.9 % (ref 11.5–15.5)
WBC: 21.8 10*3/uL — ABNORMAL HIGH (ref 4.0–10.5)
nRBC: 0 % (ref 0.0–0.2)

## 2020-09-03 NOTE — Progress Notes (Signed)
Patient is eating, ambulating, voiding.  Pain control is good.  Vitals:   09/02/20 2100 09/02/20 2205 09/03/20 0100 09/03/20 0500  BP: (!) 157/108 (!) 155/76 (!) 141/70 (!) 147/79  Pulse: (!) 107 (!) 105 87 81  Resp: 18  16 16   Temp: 99.3 F (37.4 C)  97.9 F (36.6 C) 97.7 F (36.5 C)  TempSrc: Oral  Oral Oral  SpO2: 100%  100%   Weight:      Height:        Fundus firm Perineum without swelling.  Lab Results  Component Value Date   WBC 21.8 (H) 09/03/2020   HGB 9.9 (L) 09/03/2020   HCT 29.4 (L) 09/03/2020   MCV 85.5 09/03/2020   PLT 232 09/03/2020    --/--/B POS (04/07 0852)/RI  A/P GHTN, normal labor course, Post partum day 1. BPs elevated last night but no severe.  Will watch today.  Routine care.  Expect d/c tomorrow unless needs HTN treatment.   Parents desire circ, all risks d/w them. Iron.  04-15-1998

## 2020-09-03 NOTE — Anesthesia Postprocedure Evaluation (Signed)
Anesthesia Post Note  Patient: Kayla [name]  Procedure(s) Performed: AN AD HOC LABOR EPIDURAL     Patient location during evaluation: Mother Baby Anesthesia Type: Epidural Level of consciousness: awake and alert Pain management: pain level controlled Vital Signs Assessment: post-procedure vital signs reviewed and stable Respiratory status: spontaneous breathing, nonlabored ventilation and respiratory function stable Cardiovascular status: stable Postop Assessment: no headache, no backache and epidural receding Anesthetic complications: no   No complications documented.  Last Vitals:  Vitals:   09/03/20 0100 09/03/20 0500  BP: (!) 141/70 (!) 147/79  Pulse: 87 81  Resp: 16 16  Temp: 36.6 C 36.5 C  SpO2: 100%     Last Pain:  Vitals:   09/03/20 0526  TempSrc:   PainSc: 5    Pain Goal:                   [name]

## 2020-09-04 NOTE — Discharge Summary (Signed)
Postpartum Discharge Summary  Date of Service updated      Patient Name: Kayla [name] DOB: April 08, 1991 MRN: [id number]  Date of admission: 09/01/2020 Delivery date:09/02/2020  Delivering provider: Bobbye Charleston  Date of discharge: 09/04/2020  Admitting diagnosis: Chronic hypertension affecting pregnancy [O10.919] Intrauterine pregnancy: [redacted]w[redacted]d    Secondary diagnosis:  Active Problems:   Chronic hypertension affecting pregnancy  Additional problems: GHTN    Discharge diagnosis: Term Pregnancy Delivered                                              Post partum procedures:none Augmentation: AROM, Pitocin and Cytotec Complications: None  Hospital course: Induction of Labor With Vaginal Delivery   30y.o. yo [id number] admitted to the hospital 09/01/2020 for induction of labor.  Indication for induction: Gestational hypertension.  Patient had an uncomplicated labor course as follows: Membrane Rupture Time/Date: 9:05 AM ,09/02/2020   Delivery Method:Vaginal, Spontaneous  Episiotomy: None  Lacerations:  1st degree;Perineal  Details of delivery can be found in separate delivery note.  Patient had a routine postpartum course. Patient is discharged home 09/04/20.  Newborn Data: Birth date:09/02/2020  Birth time:5:35 PM  Gender:Female  Living status:Living  Apgars:8 ,9  Weight:3419 g   Magnesium Sulfate received: No BMZ received: No Rhophylac:No MMR:No T-DaP:Given prenatally Flu: No Transfusion:No  Physical exam  Vitals:   09/03/20 0852 09/03/20 1353 09/03/20 2136 09/04/20 0630  BP: (!) 144/77 130/75 (!) 142/75 (!) 132/51  Pulse: 83 79 80 76  Resp: _0 Temp: 98.4 F (36.9 C) 98.2 F (36.8 C) 98 F (36.7 C) 98.1 F (36.7 C)  TempSrc:   Oral   SpO2:   100% 100%  Weight:      Height:        Labs: Lab Results  Component Value Date   WBC 21.8 (H) 09/03/2020   HGB 9.9 (L) 09/03/2020   HCT 29.4 (L) 09/03/2020   MCV 85.5 09/03/2020   PLT 232  09/03/2020   CMP Latest Ref Rng & Units 09/01/2020  Glucose 70 - 99 mg/dL 76  BUN 6 - 20 mg/dL <5(L)  Creatinine 0.44 - 1.00 mg/dL 0.59  Sodium 135 - 145 mmol/L 133(L)  Potassium 3.5 - 5.1 mmol/L 4.4  Chloride 98 - 111 mmol/L 105  CO2 22 - 32 mmol/L 18(L)  Calcium 8.9 - 10.3 mg/dL 9.3  Total Protein 6.5 - 8.1 g/dL 6.2(L)  Total Bilirubin 0.3 - 1.2 mg/dL 0.3  Alkaline Phos 38 - 126 U/L 150(H)  AST 15 - 41 U/L 20  ALT 0 - 44 U/L 11   Edinburgh Score: Edinburgh Postnatal Depression Scale Screening Tool 09/03/2020  I have been able to laugh and see the funny side of things. 0  I have looked forward with enjoyment to things. 0  I have blamed myself unnecessarily when things went wrong. 0  I have been anxious or worried for no good reason. 0  I have felt scared or panicky for no good reason. 0  Things have been getting on top of me. 1  I have been so unhappy that I have had difficulty sleeping. 0  I have felt sad or miserable. 0  I have been so unhappy that I have been crying. 0  The thought of harming myself has occurred to me. 0  Edinburgh Postnatal Depression Scale Total 1      After visit meds:  Allergies as of 09/04/2020      Reactions   Penicillins Itching, Rash   Has patient had a PCN reaction causing immediate rash, facial/tongue/throat swelling, SOB or lightheadedness with hypotension: No Has patient had a PCN reaction causing severe rash involving mucus membranes or skin necrosis: No Has patient had a PCN reaction that required hospitalization: No Has patient had a PCN reaction occurring within the last 10 years: No If all of the above answers are "NO", then may proceed with Cephalosporin use.      Medication List    STOP taking these medications   aspirin 81 MG EC tablet     TAKE these medications   acetaminophen 500 MG tablet Commonly known as: TYLENOL Take 1,000 mg by mouth every 6 (six) hours as needed for mild pain or headache.    butalbital-acetaminophen-caffeine 50-325-40 MG tablet Commonly known as: FIORICET Take 1 tablet by mouth every 6 (six) hours as needed for headache.   prenatal multivitamin Tabs tablet Take 1 tablet by mouth daily at 12 noon.            Discharge Care Instructions  (From admission, onward)         Start     Ordered   09/04/20 0000  Discharge wound care:       Comments: Sitz baths and icepacks to perineum.  If stitches, they will dissolve.   09/04/20 0940           Discharge home in stable condition Infant Feeding: ? Infant Disposition:home with mother Discharge instruction: per After Visit Summary and Postpartum booklet. Activity: Advance as tolerated. Pelvic rest for 6 weeks.  Diet: low salt diet Anticipated Birth Control: Unsure Postpartum Appointment:4 weeks Additional Postpartum F/U: BP check 2weeks Future Appointments:No future appointments. Follow up Visit:  Follow-up Information    Ob/Gyn, [facility] Follow up in 2 week(s).   Why: BP check Contact information: [address] [address] [phone]                   09/04/2020 Daria Pastures, MD

## 2020-09-04 NOTE — Progress Notes (Signed)
Patient is eating, ambulating, voiding.  Pain control is good.  Vitals:   09/03/20 0852 09/03/20 1353 09/03/20 2136 09/04/20 0630  BP: (!) 144/77 130/75 (!) 142/75 (!) 132/51  Pulse: 83 79 80 76  Resp: 20 20 19 18   Temp: 98.4 F (36.9 C) 98.2 F (36.8 C) 98 F (36.7 C) 98.1 F (36.7 C)  TempSrc:   Oral   SpO2:   100% 100%  Weight:      Height:        Fundus firm Perineum without swelling.  Lab Results  Component Value Date   WBC 21.8 (H) 09/03/2020   HGB 9.9 (L) 09/03/2020   HCT 29.4 (L) 09/03/2020   MCV 85.5 09/03/2020   PLT 232 09/03/2020    --/--/B POS (04/07 0852)/RI BPs stable with diastolics 50-70s.  Routine care.  Expect d/c today and f/u in 2 weeks BP check.    04-15-1998

## 2020-09-09 ENCOUNTER — Inpatient Hospital Stay (HOSPITAL_COMMUNITY)
Admission: AD | Admit: 2020-09-09 | Discharge: 2020-09-11 | DRG: 776 | Disposition: A | Discharge status: Home / Self Care | Payer: Medicaid Other | Attending: Obstetrics and Gynecology | Admitting: Obstetrics and Gynecology

## 2020-09-09 ENCOUNTER — Other Ambulatory Visit: Payer: Self-pay

## 2020-09-09 ENCOUNTER — Encounter (HOSPITAL_COMMUNITY): Payer: Self-pay | Admitting: Obstetrics and Gynecology

## 2020-09-09 DIAGNOSIS — O99893 Other specified diseases and conditions complicating puerperium: Secondary | ICD-10-CM | POA: Diagnosis present

## 2020-09-09 DIAGNOSIS — M940 Chondrocostal junction syndrome [Tietze]: Secondary | ICD-10-CM | POA: Diagnosis present

## 2020-09-09 DIAGNOSIS — O1415 Severe pre-eclampsia, complicating the puerperium: Principal | ICD-10-CM | POA: Diagnosis present

## 2020-09-09 DIAGNOSIS — Z20822 Contact with and (suspected) exposure to covid-19: Secondary | ICD-10-CM | POA: Diagnosis not present

## 2020-09-09 DIAGNOSIS — O1410 Severe pre-eclampsia, unspecified trimester: Secondary | ICD-10-CM | POA: Diagnosis present

## 2020-09-09 LAB — CBC WITH DIFFERENTIAL/PLATELET
Abs Immature Granulocytes: 0.23 10*3/uL — ABNORMAL HIGH (ref 0.00–0.07)
Basophils Absolute: 0.1 10*3/uL (ref 0.0–0.1)
Basophils Relative: 1 %
Eosinophils Absolute: 0.4 10*3/uL (ref 0.0–0.5)
Eosinophils Relative: 3 %
HCT: 34.3 % — ABNORMAL LOW (ref 36.0–46.0)
Hemoglobin: 11.2 g/dL — ABNORMAL LOW (ref 12.0–15.0)
Immature Granulocytes: 2 %
Lymphocytes Relative: 30 %
Lymphs Abs: 4.3 10*3/uL — ABNORMAL HIGH (ref 0.7–4.0)
MCH: 28.6 pg (ref 26.0–34.0)
MCHC: 32.7 g/dL (ref 30.0–36.0)
MCV: 87.7 fL (ref 80.0–100.0)
Monocytes Absolute: 1.2 10*3/uL — ABNORMAL HIGH (ref 0.1–1.0)
Monocytes Relative: 8 %
Neutro Abs: 8 10*3/uL — ABNORMAL HIGH (ref 1.7–7.7)
Neutrophils Relative %: 56 %
Platelets: 430 10*3/uL — ABNORMAL HIGH (ref 150–400)
RBC: 3.91 MIL/uL (ref 3.87–5.11)
RDW: 14.3 % (ref 11.5–15.5)
WBC: 14.1 10*3/uL — ABNORMAL HIGH (ref 4.0–10.5)
nRBC: 0 % (ref 0.0–0.2)

## 2020-09-09 LAB — COMPREHENSIVE METABOLIC PANEL
ALT: 35 U/L (ref 0–44)
AST: 22 U/L (ref 15–41)
Albumin: 3.3 g/dL — ABNORMAL LOW (ref 3.5–5.0)
Alkaline Phosphatase: 98 U/L (ref 38–126)
Anion gap: 8 (ref 5–15)
BUN: 9 mg/dL (ref 6–20)
CO2: 22 mmol/L (ref 22–32)
Calcium: 8.8 mg/dL — ABNORMAL LOW (ref 8.9–10.3)
Chloride: 108 mmol/L (ref 98–111)
Creatinine, Ser: 0.81 mg/dL (ref 0.44–1.00)
GFR, Estimated: >60 mL/min (ref >60)
Glucose, Bld: 93 mg/dL (ref 70–99)
Potassium: 3.7 mmol/L (ref 3.5–5.1)
Sodium: 138 mmol/L (ref 135–145)
Total Bilirubin: 0.3 mg/dL (ref 0.3–1.2)
Total Protein: 6.3 g/dL — ABNORMAL LOW (ref 6.5–8.1)

## 2020-09-09 MED ORDER — LABETALOL HCL 5 MG/ML IV SOLN: 80.0000 mg | INTRAVENOUS | Status: DC | PRN

## 2020-09-09 MED ORDER — NIFEDIPINE ER OSMOTIC RELEASE 30 MG PO TB24
30.0000 mg | ORAL_TABLET | Freq: Every day | ORAL | Status: DC
  Administered 2020-09-09 – 2020-09-10 (×2): 30 mg via ORAL
  Filled 2020-09-09 (×2): qty 1

## 2020-09-09 MED ORDER — HYDRALAZINE HCL 20 MG/ML IJ SOLN: 10.0000 mg | INTRAMUSCULAR | Status: DC | PRN

## 2020-09-09 MED ORDER — LABETALOL HCL 5 MG/ML IV SOLN: 20.0000 mg | INTRAVENOUS | Status: DC | PRN

## 2020-09-09 MED ORDER — LABETALOL HCL 5 MG/ML IV SOLN: 40.0000 mg | INTRAVENOUS | Status: DC | PRN

## 2020-09-09 MED ORDER — IBUPROFEN 600 MG PO TABS
600.0000 mg | ORAL_TABLET | Freq: Four times a day (QID) | ORAL | Status: DC
  Administered 2020-09-09 – 2020-09-11 (×7): 600 mg via ORAL
  Filled 2020-09-09 (×7): qty 1

## 2020-09-09 MED ORDER — ACETAMINOPHEN 325 MG PO TABS
650.0000 mg | ORAL_TABLET | Freq: Four times a day (QID) | ORAL | Status: DC | PRN
  Administered 2020-09-10 (×2): 650 mg via ORAL
  Filled 2020-09-09 (×2): qty 2

## 2020-09-09 MED ORDER — ACETAMINOPHEN 325 MG PO TABS: 650.0000 mg | ORAL_TABLET | Freq: Four times a day (QID) | ORAL | Status: DC | PRN

## 2020-09-09 MED ORDER — MAGNESIUM SULFATE BOLUS VIA INFUSION
4.0000 g | Freq: Once | INTRAVENOUS | Status: AC
  Administered 2020-09-09: 4 g via INTRAVENOUS
  Filled 2020-09-09: qty 1000

## 2020-09-09 MED ORDER — MAGNESIUM SULFATE 40 GM/1000ML IV SOLN
2.0000 g/h | INTRAVENOUS | Status: AC
  Administered 2020-09-09 – 2020-09-10 (×2): 2 g/h via INTRAVENOUS
  Filled 2020-09-09 (×2): qty 1000

## 2020-09-09 MED ORDER — IBUPROFEN 600 MG PO TABS: 600.0000 mg | ORAL_TABLET | Freq: Four times a day (QID) | ORAL | Status: DC | PRN

## 2020-09-09 MED ORDER — LACTATED RINGERS IV SOLN: INTRAVENOUS | Status: DC

## 2020-09-09 NOTE — H&P (Signed)
Kayla [name] is a 30 y.o. female [phone] s/p SVD on 09/02/20, with peripartum course complicated by gestational hypertension, who presented to MAU this evening after home BP 164/89. She also notes headache that started today, rates it 8/10, not relieved by Tylenol. She endorses midsternal pain and RUQ pain.   In MAU, Bps  149/93, 154/88, 169/103, 168/86, 148/85. EKG was performed which is normal sinus rhythm  OB History    Gravida  4   Para  4   Term  3   Preterm  1   AB  0   Living  4     SAB  0   IAB  0   Ectopic  0   Multiple  0   Live Births  4          Past Medical History:  Diagnosis Date  . Hernia of abdominal wall 2019  . History of frequent urinary tract infections   . Pregnancy induced hypertension   . Preterm labor    Past Surgical History:  Procedure Laterality Date  . CERVICAL CERCLAGE N/A 07/03/2018   Procedure: CERCLAGE CERVICAL;  Surgeon: Carrington Clamp, MD;  Location: [facility] BIRTHING SUITES;  Service: Gynecology;  Laterality: N/A;  . CERVICAL CERCLAGE N/A 03/18/2020   Procedure: CERCLAGE CERVICAL;  Surgeon: Waynard Reeds, MD;  Location: [facility] LD ORS;  Service: Gynecology;  Laterality: N/A;  . NO PAST SURGERIES     Family History: family history includes Heart disease (age of onset: 27) in her father. Social History:  reports that she has never smoked. She has never used smokeless tobacco. She reports previous alcohol use. She reports that she does not use drugs.      Review of Systems Per HPI Exam Physical Exam    Blood pressure (!) 156/98, pulse 79, temperature 99.1 F (37.3 C), temperature source Oral, resp. rate 18, height 5' (1.524 m), weight 80.1 kg, last menstrual period 12/16/2019, SpO2 100 %, unknown if currently breastfeeding. NAD, resting comfortably, tearful Chest midsternal tenderness Abd: No RUQ tenderness, she does have tenderness of right lower ribs, fundus below umbilicus   Prenatal labs: ABO, Rh:  --/--/B POS (04/07  1025) Antibody: NEG (04/07 0852) Rubella: Immune (09/30 0000) RPR: NON REACTIVE (04/07 0830)  HBsAg: Negative (09/30 0000)  HIV: Non-reactive (09/30 0000)  GBS: Negative/-- (03/24 0000)   Assessment/Plan: 86I [id number] PPD#7 s/p SVD, now with severe preeclampsia 1 Admit to [facility]  2. Severe preeclampsia  - CBC, CMP within normal limits  - Start IV magnesium 4g bolus followed by 2g/hour. Monitor for signs of worsening preeclampsia or magnesium toxicity - Start Procardia XL 30mg  - IV antihypertensives PRN severe range Bps 3. Chest pain consistent with costochondritis on exam given reproducibility - NSAIDs PRN - EKG NSR - Advised to notify staff if change in chest pain or SOB 4. Headache - Ibuprofen, tylenol PRN 5. Postpartum - breastfeeding and supplementing with formula    09/09/2020, 10:48 PM

## 2020-09-09 NOTE — MAU Provider Note (Signed)
History     CSN: [id number]  Arrival date and time: 09/09/20 2046   Event Date/Time   First Provider Initiated Contact with Patient 09/09/20 2124      Chief Complaint  Patient presents with  . Headache  . Abdominal Pain   [name] is a 30 y.o. [phone] at [redacted]w[redacted]d who presents to MAU for preeclampsia evaluation after she started experienced a headache unrelieved by Tylenol that she rates as 8/10, chest pain and RUQ pain, along with a severe range pressure at home. Patient reports her symptoms started last night and she had been taking her blood pressure, but was told to present to the hospital only if her pressure was severe, which is why she came in at this time.  Patient delivered on 09/02/2020 and had elevated pressures while in the hospital, but was not given magnesium in the hospital or sent home on blood pressure medication.  Pt denies blurry vision/seeing spots, N/V, epigastric pain, swelling in face and hands, sudden weight gain. Pt denies SOB.  Pt denies constipation, diarrhea, or urinary problems. Pt denies fever, chills, fatigue, sweating or changes in appetite. Pt denies dizziness, light-headedness, weakness.   OB History    Gravida  4   Para  4   Term  3   Preterm  1   AB  0   Living  4     SAB  0   IAB  0   Ectopic  0   Multiple  0   Live Births  4           Past Medical History:  Diagnosis Date  . Hernia of abdominal wall 2019  . History of frequent urinary tract infections   . Pregnancy induced hypertension   . Preterm labor     Past Surgical History:  Procedure Laterality Date  . CERVICAL CERCLAGE N/A 07/03/2018   Procedure: CERCLAGE CERVICAL;  Surgeon: Carrington Clamp, MD;  Location: [facility] BIRTHING SUITES;  Service: Gynecology;  Laterality: N/A;  . CERVICAL CERCLAGE N/A 03/18/2020   Procedure: CERCLAGE CERVICAL;  Surgeon: Waynard Reeds, MD;  Location: [facility] LD [facility]S;  Service: Gynecology;  Laterality: N/A;  . NO PAST  SURGERIES      Family History  Problem Relation Age of Onset  . Heart disease Father 61       AMI  . Cancer Neg Hx   . Diabetes Neg Hx   . Stroke Neg Hx     Social History   Tobacco Use  . Smoking status: Never Smoker  . Smokeless tobacco: Never Used  Vaping Use  . Vaping Use: Never used  Substance Use Topics  . Alcohol use: Not Currently  . Drug use: No    Allergies:  Allergies  Allergen Reactions  . Penicillins Itching and Rash    Has patient had a PCN reaction causing immediate rash, facial/tongue/throat swelling, SOB or lightheadedness with hypotension: No Has patient had a PCN reaction causing severe rash involving mucus membranes or skin necrosis: No Has patient had a PCN reaction that required hospitalization: No Has patient had a PCN reaction occurring within the last 10 years: No If all of the above answers are "NO", then may proceed with Cephalosporin use.     Medications Prior to Admission  Medication Sig Dispense Refill Last Dose  . acetaminophen (TYLENOL) 500 MG tablet Take 1,000 mg by mouth every 6 (six) hours as needed for mild pain or headache.   09/09/2020 at Unknown time  .  Prenatal Vit-Fe Fumarate-FA (PRENATAL MULTIVITAMIN) TABS tablet Take 1 tablet by mouth daily at 12 noon.   09/09/2020 at Unknown time  . butalbital-acetaminophen-caffeine (FIORICET) 50-325-40 MG tablet Take 1 tablet by mouth every 6 (six) hours as needed for headache.   08/30/2020    Review of Systems  Constitutional: Negative for chills, diaphoresis, fatigue and fever.  Eyes: Negative for visual disturbance.  Respiratory: Negative for shortness of breath.   Cardiovascular: Positive for chest pain.  Gastrointestinal: Positive for abdominal pain. Negative for constipation, diarrhea, nausea and vomiting.  Genitourinary: Negative for dysuria, flank pain, frequency, pelvic pain, urgency, vaginal bleeding and vaginal discharge.  Neurological: Positive for headaches. Negative for  dizziness, weakness and light-headedness.   Physical Exam   Blood pressure (!) 154/88, pulse 90, temperature 99.3 F (37.4 C), temperature source Oral, resp. rate 18, height 5' (1.524 m), weight 80.1 kg, last menstrual period 12/16/2019, SpO2 100 %, unknown if currently breastfeeding.  Patient Vitals for the past 24 hrs:  BP Temp Temp src Pulse Resp SpO2 Height Weight  09/09/20 2120 (!) 154/88 -- -- 90 -- -- -- --  09/09/20 2105 (!) 149/93 99.3 F (37.4 C) Oral (!) 103 18 100 % 5' (1.524 m) 80.1 kg   Physical Exam Vitals and nursing note reviewed.  Constitutional:      General: She is not in acute distress.    Appearance: Normal appearance. She is not ill-appearing, toxic-appearing or diaphoretic.  HENT:     Head: Normocephalic and atraumatic.  Pulmonary:     Effort: Pulmonary effort is normal.  Neurological:     Mental Status: She is alert and oriented to person, place, and time.  Psychiatric:        Mood and Affect: Mood normal.        Behavior: Behavior normal.        Thought Content: Thought content normal.        Judgment: Judgment normal.    No results found for this or any previous visit (from the past 24 hour(s)).  No results found.  MAU Course  Procedures  MDM -pt with severe ppartum preeclampsia -consulted with Dr. Macon Large who agrees with plan for admission -called Dr. Timothy Lasso to recommend admission, agrees with plan -admit to [facility]  Orders Placed This Encounter  Procedures  . Comprehensive metabolic panel    Standing Status:   Standing    Number of Occurrences:   1  . CBC with Differential    Standing Status:   Standing    Number of Occurrences:   1  . Diet regular Room service appropriate? Yes; Fluid consistency: Thin    Standing Status:   Standing    Number of Occurrences:   1    Order Specific Question:   Room service appropriate?    Answer:   Yes    Order Specific Question:   Fluid consistency:    Answer:   Thin  . Notify  Physician    Confirmatory reading of BP> 160/110 15 minutes later    Standing Status:   Standing    Number of Occurrences:   1    Order Specific Question:   Notify Physician    Answer:   Temp greater than or equal to 100.4    Order Specific Question:   Notify Physician    Answer:   RR greater than 24 or less than 10    Order Specific Question:   Notify Physician    Answer:  HR greater than 120 or less than 50    Order Specific Question:   Notify Physician    Answer:   SBP greater than 160 mmHG or less than 80 mmHG    Order Specific Question:   Notify Physician    Answer:   DBP greater than 110 mmHG or less than 45 mmHG    Order Specific Question:   Notify Physician    Answer:   Urinary output is less than for any 4 hour period  . Initiate Oral Care Protocol    Standing Status:   Standing    Number of Occurrences:   1  . Initiate Carrier Fluid Protocol    Standing Status:   Standing    Number of Occurrences:   1  . SCDs    Standing Status:   Standing    Number of Occurrences:   1    Order Specific Question:   Laterality    Answer:   Bilateral  . Vital signs    Standing Status:   Standing    Number of Occurrences:   1  . Measure blood pressure    20 minutes after giving hydralazine 10 MG IV dose.  Call MD if SBP >/= 160 or DBP >/= 110.    Standing Status:   Standing    Number of Occurrences:   1  . Full code    Standing Status:   Standing    Number of Occurrences:   1  . EKG 12-Lead    Standing Status:   Standing    Number of Occurrences:   1  . Admit to Inpatient (patient's expected length of stay will be greater than 2 midnights or inpatient only procedure)    Standing Status:   Standing    Number of Occurrences:   1    Order Specific Question:   Hospital Area    Answer:   [facility] [[id number]]    Order Specific Question:   Level of Care    Answer:   Postpartum [19]    Order Specific Question:   Covid Evaluation    Answer:   Asymptomatic  Screening Protocol (No Symptoms)    Order Specific Question:   Diagnosis    Answer:   Preeclampsia, severe [[id number]]    Order Specific Question:   Admitting Physician    Answer:   Charlett Nose [[id number]]    Order Specific Question:   Attending Physician    Answer:   Charlett Nose [[id number]]    Order Specific Question:   Estimated length of stay    Answer:   past midnight tomorrow    Order Specific Question:   Certification:    Answer:   I certify this patient will need inpatient services for at least 2 midnights   Meds ordered this encounter  Medications  . magnesium bolus via infusion 4 g  . magnesium sulfate 40 grams in SWI 1000 mL OB infusion  . lactated ringers infusion  . AND Linked Order Group   . labetalol (NORMODYNE) injection 20 mg   . labetalol (NORMODYNE) injection 40 mg   . labetalol (NORMODYNE) injection 80 mg   . hydrALAZINE (APRESOLINE) injection 10 mg  . NIFEdipine (PROCARDIA-XL/NIFEDICAL-XL) 24 hr tablet 30 mg   Assessment and Plan   1. Severe pre-eclampsia, postpartum condition or complication    -admit to [facility]  Odie Sera Kailey Esquilin 09/09/2020, 9:43 PM

## 2020-09-09 NOTE — MAU Note (Signed)
PT SAYS SHE DEL ON 09-02-2020- VAG - DR HORVATH   D/C ON 09-04-20- ALL OK. THEN YESTERDAY -  STARTED PAIN UNDER HER RIGHT  BREAST , TOP OF CHEST PAIN, H/A - -  TOOK XS TYLENOL 2 TAB AT 10A AND AT 7 PM- 2 TABS -  NO RELIEF.   TODAY STILL HAS ALL  SYMPTOMS - TOOK XS TYLENOL 2 TABS AT 6 PM - NO RELIEF   BP AT HOME 164/98  AT 8PM-  NO MEDS FOR BP

## 2020-09-10 LAB — SARS CORONAVIRUS 2 (TAT 6-24 HRS): SARS Coronavirus 2: NEGATIVE

## 2020-09-10 NOTE — Progress Notes (Signed)
Sareen Randon 30 y.o. [id number] PPD#8 now HD#2 admitted with postpartum severe preeclampsia S: Rovena is feeling well this morning without complaints. She states headache and chest pain have resolved completely.  O: Vitals:   09/10/20 0745 09/10/20 0847 09/10/20 1000 09/10/20 1001  BP:    (!) 146/92  Pulse:    (!) 103  Resp: 16 16  17   Temp:   98.2 F (36.8 C)   TempSrc:   Oral   SpO2:   99%   Weight:      Height:       Gen: well appearing, no distress Abd: nontender, soft Ext; no calf edema or tenderness  A/p: Lanea Vankirk is a [place] Mediate PPD#8 s/p SVD, admitted with severe preeclampsia   1. Severe preeclampsia  - CBC, CMP within normal limits  - Continue IV mag x 24 hours. Monitor for signs of worsening preeclampsia or magnesium toxicity - Procardia XL 30mg  started last night, Bps have been mostly 130s/70s with few 140-150/90s.  -  IV antihypertensives PRN severe range Bps - has not been required at this point 2. Chest pain consistent with costochondritis on exam given reproducibility - resolved - NSAIDs PRN - EKG NSR - Advised to notify staff if change in chest pain or SOB 3. Headache - resolved - Ibuprofen, tylenol PRN 4. Postpartum - breastfeeding and supplementing with formula 5. Dispo - To come off magnesium this evening, will monitor overnight to trend blood pressures off of magnesium and consider discharge tomorrow if Bps controlled on Procardia XL 30mg    85Y [id number] 09/10/20 10:13 AM

## 2020-09-11 MED ORDER — NIFEDIPINE ER OSMOTIC RELEASE 30 MG PO TB24
60.0000 mg | ORAL_TABLET | Freq: Every day | ORAL | Status: DC
  Administered 2020-09-11: 60 mg via ORAL
  Filled 2020-09-11: qty 2

## 2020-09-11 MED ORDER — NIFEDIPINE ER 60 MG PO TB24: 60.0000 mg | ORAL_TABLET | Freq: Every day | ORAL | 11 refills | Status: AC

## 2020-09-11 MED ORDER — IBUPROFEN 600 MG PO TABS: 600.0000 mg | ORAL_TABLET | Freq: Four times a day (QID) | ORAL | 0 refills | Status: AC

## 2020-09-11 NOTE — Progress Notes (Signed)
Reviewed discharge postpartum instructions with patient regarding medications, when to call MD/go to MAU, highs and lows of blood pressures, signs and symptoms of pre-e, postpartum course, and to schedule follow up BP check in 4 days. Patient reports having blood pressure cuff at home. Patient verbalized understanding of discharge postpartum instructions and asked appropriate questions.

## 2020-09-11 NOTE — Plan of Care (Signed)
  Problem: Education: Goal: Knowledge of General Education information will improve Description: Including pain rating scale, medication(s)/side effects and non-pharmacologic comfort measures Outcome: Adequate for Discharge   Problem: Health Behavior/Discharge Planning: Goal: Ability to manage health-related needs will improve Outcome: Adequate for Discharge   Problem: Clinical Measurements: Goal: Ability to maintain clinical measurements within normal limits will improve Outcome: Adequate for Discharge Goal: Will remain free from infection Outcome: Adequate for Discharge Goal: Diagnostic test results will improve Outcome: Adequate for Discharge Goal: Respiratory complications will improve Outcome: Adequate for Discharge Goal: Cardiovascular complication will be avoided Outcome: Adequate for Discharge   Problem: Activity: Goal: Risk for activity intolerance will decrease Outcome: Adequate for Discharge   Problem: Nutrition: Goal: Adequate nutrition will be maintained Outcome: Adequate for Discharge   Problem: Coping: Goal: Level of anxiety will decrease Outcome: Adequate for Discharge   Problem: Elimination: Goal: Will not experience complications related to bowel motility Outcome: Adequate for Discharge Goal: Will not experience complications related to urinary retention Outcome: Adequate for Discharge   Problem: Pain Managment: Goal: General experience of comfort will improve Outcome: Adequate for Discharge   Problem: Safety: Goal: Ability to remain free from injury will improve Outcome: Adequate for Discharge   Problem: Skin Integrity: Goal: Risk for impaired skin integrity will decrease Outcome: Adequate for Discharge   Problem: Education: Goal: Knowledge of disease or condition will improve Outcome: Adequate for Discharge Goal: Knowledge of the prescribed therapeutic regimen will improve Outcome: Adequate for Discharge

## 2020-09-11 NOTE — Progress Notes (Signed)
Andres Escandon 29 y.o. [id number] at [redacted]w[redacted]d HD#3 admitted with postpartum severe preeclampsia S: Ronell is feeling well this morning without complaints. She states headache and chest pain have resolved completely. Happy to be off of magnesium and eager to go home.   O: Vitals:   09/10/20 1931 09/10/20 2144 09/10/20 2318 09/11/20 0400  BP: 140/77 (!) 142/73 135/79 134/72  Pulse: (!) 115 (!) 112 (!) 112 (!) 102  Resp: 18  18   Temp: 98.2 F (36.8 C)  98.2 F (36.8 C) 98.2 F (36.8 C)  TempSrc: Oral  Oral Oral  SpO2: 99%  100% 100%  Weight:      Height:       Gen: well appearing, no distress Abd: nontender, soft Ext; no calf edema or tenderness  A/p: Astryd Pearcy is a 11O [id number] PPD#9 s/p SVD, admitted with severe preeclampsia   1. Severe preeclampsia - CBC, CMP within normal limits - S/p IV mag x 24 hours.  - On Procardia XL 30mg , blood pressures over past 12 hours mostly normotensive with few mild range (140s/70s).  -  IV antihypertensives PRN severe range Bps - has not been required at this point 2. Chest pain consistent with costochondritis on exam given reproducibility - resolved - NSAIDs PRN - EKG NSR - Advised to notify staff if change in chest pain or SOB 3. Headache - resolved - Ibuprofen, tylenol PRN 4. Postpartum - breastfeeding and supplementing with formula 5. Dispo - Will watch Bps this morning and if remain controlled on Procardia 30, will d/c home with office follow up this week    09/11/20 7:56 AM

## 2020-09-11 NOTE — Discharge Instructions (Signed)
Postpartum Hypertension Postpartum hypertension is high blood pressure that is higher than normal after childbirth. It usually starts within 1 to 2 days after delivery, but it can happen at any time for up to 6 weeks after delivery. For some women, medical treatment is required to prevent serious complications, such as seizures or stroke. What are the causes? The cause of this condition is not well understood. In some cases, the cause may not be known. Certain conditions may increase your risk. These include:  Hypertension that existed before pregnancy (chronic hypertension).  Hypertension that comes as a result of pregnancy (gestational hypertension).  Hypertensive disorders during pregnancy or seizures in women who have high blood pressure during pregnancy. These conditions are called preeclampsia and eclampsia.  A condition in which the liver, platelets, and red blood cells are damaged during pregnancy (HELLP syndrome).  Obesity.  Diabetes. What are the signs or symptoms? As with all types of hypertension, postpartum hypertension may not have any symptoms. Depending on how high your blood pressure is, you may experience:  Headaches. These may be mild, moderate, or severe. They may also be steady, constant, or sudden in onset (thunderclap headache).  Vision changes, such as blurry vision, flashing lights, or seeing spots.  Nausea and vomiting.  Pain in the upper right side of your abdomen.  Shortness of breath.  Difficulty breathing while lying down.  A decrease in the amount of urine that you pass. How is this diagnosed? This condition may be diagnosed based on the results of a physical exam, blood pressure measurements, and blood and urine tests. You may also have other tests, such as a CT scan or an MRI, to check for other problems of postpartum hypertension. How is this treated? If blood pressure is high enough to require treatment, your options may include:  Medicines to  reduce blood pressure (antihypertensives). Tell your health care provider if you are breastfeeding or if you plan to breastfeed. There are many antihypertensive medicines that are safe to take while breastfeeding.  Treating medical conditions that are causing hypertension.  Treating the complications of hypertension, such as seizures, stroke, or kidney problems. Your health care provider will also continue to monitor your blood pressure closely until it is within a safe range for you. Follow these instructions at home: Learn your goal blood pressure Two numbers make up your blood pressure. The first number is called systolic pressure. The second is called diastolic pressure. An example of a blood pressure reading is "120 over 80" (or 120/80). For most people, goal blood pressure is:  First number: below 140.  Second number: below 90. Your blood pressure is above normal even if only the top or bottom number is above normal. Know what to do before you take your blood pressure 30 minutes before you check your blood pressure:  Do not drink caffeine.  Do not drink alcohol.  Avoid food and drink.  Do not smoke.  Do not exercise. 5 minutes before you check your blood pressure:  Use the bathroom and urinate so that you have an empty bladder.  Sit quietly in a dining room chair. Do not sit in a soft couch or an armchair. Do not talk. Know how to take your blood pressure To check your blood pressure, follow the instructions in the manual that came with your blood pressure monitor. If you have a digital blood pressure monitor, the instructions may be as follows: 1. Sit up straight. 2. Place your feet on the floor. Do   not cross your ankles or legs. 3. Rest your left arm at the level of your heart. You may rest it on a table, desk, or chair. 4. Pull up your shirt sleeve. 5. Wrap the blood pressure cuff around the upper part of your left arm. The cuff should be 1 inch (2.5 cm) above your  elbow. It is best to wrap the cuff around bare skin. 6. Fit the cuff snugly around your arm. You should be able to place only one finger between the cuff and your arm. 7. Put the cord inside the groove of your elbow. 8. Press the power button. 9. Sit quietly while the cuff fills with air and loses air. 10. Write down the numbers on the screen. These are your blood pressure readings. 11. Wait 1-2 minutes and then repeat steps 1-10.   Record your blood pressure readings Follow your health care provider's instructions on how to record your blood pressure readings. If you were asked to use this form, follow these instructions:  Get one reading in the morning (a.m.) before you take any medicines.  Get one reading in the evening (p.m.) before supper.  Take at least 2 readings with each blood pressure check. This makes sure the results are correct. Wait 1-2 minutes between measurements.  Write down the results in the spaces on this form. Date: _______________________  a.m. _____________________(1st reading) _____________________(2nd reading)  p.m. _____________________(1st reading) _____________________(2nd reading) Date: _______________________  a.m. _____________________(1st reading) _____________________(2nd reading)  p.m. _____________________(1st reading) _____________________(2nd reading) Date: _______________________  a.m. _____________________(1st reading) _____________________(2nd reading)  p.m. _____________________(1st reading) _____________________(2nd reading) Date: _______________________  a.m. _____________________(1st reading) _____________________(2nd reading)  p.m. _____________________(1st reading) _____________________(2nd reading) Date: _______________________  a.m. _____________________(1st reading) _____________________(2nd reading)  p.m. _____________________(1st reading) _____________________(2nd reading) General instructions  Take over-the-counter and  prescription medicines only as told by your health care provider.  Do not use any products that contain nicotine or tobacco. These products include cigarettes, chewing tobacco, and vaping devices, such as e-cigarettes. If you need help quitting, ask your health care provider.  Check your blood pressure as often as recommended by your health care provider.  Return to your normal activities as told by your health care provider. Ask your health care provider what activities are safe for you.  Keep all follow-up visits. This is important. Contact a health care provider if:  You have new symptoms, such as: ? A headache that does not get better. ? Dizziness. ? Visual changes. ? Nausea and vomiting. Get help right away if:  You develop difficulty breathing.  You have chest pain.  You faint.  You have any symptoms of a stroke. "BE FAST" is an easy way to remember the main warning signs of a stroke: ? B - Balance. Signs are dizziness, sudden trouble walking, or loss of balance. ? E - Eyes. Signs are trouble seeing or a sudden change in vision. ? F - Face. Signs are sudden weakness or numbness of the face, or the face or eyelid drooping on one side. ? A - Arms. Signs are weakness or numbness in an arm. This happens suddenly and usually on one side of the body. ? S - Speech. Signs are sudden trouble speaking, slurred speech, or trouble understanding what people say. ? T - Time. Time to call emergency services. Write down what time symptoms started.  You have other signs of a stroke, such as: ? A sudden, severe headache with no known cause. ? Nausea or vomiting. ? Seizure. These symptoms   may represent a serious problem that is an emergency. Do not wait to see if the symptoms will go away. Get medical help right away. Call your local emergency services (911 in the [place] Do not drive yourself to the hospital. Summary  Postpartum hypertension is high blood pressure that remains higher than  normal after childbirth.  For some women, medical treatment is required to prevent serious complications, such as seizures or stroke.  Follow your health care provider's instructions on how to record your blood pressure readings.  Keep all follow-up visits. This is important. This information is not intended to replace advice given to you by your health care provider. Make sure you discuss any questions you have with your health care provider. Document Revised: 02/09/2020 Document Reviewed: 02/09/2020 Elsevier Patient Education  2021 [employer]  

## 2020-09-11 NOTE — Discharge Summary (Signed)
Physician Discharge Summary  Patient ID: Kayla [name] MRN: [id number] DOB/AGE: 07-23-1990 30 y.o.  Admit date: 09/09/2020 Discharge date: 09/11/2020  Admission Diagnoses: Severe preeclampsia  Discharge Diagnoses:  Active Problems:   Preeclampsia, severe   Discharged Condition: good  Hospital Course:  Patient presented to hospital on 09/09/20 after severe range blood pressure at home. She also noted headache and chest pain at the time. EKG was normal sinus rhythm. She was admitted for postpartum severe preeclampsia. She was started on IV magnesium for seizure prophylaxis and Procardia XL 30mg . Her magnesium was discontinued after 24 hours. Her headache and chest pain symptoms resolved completely. She did not require IV antihypertensives. On hospital day 2, her Procardia was increased to 60mg  daily. She was discharged home on 09/11/20 with good blood pressure control.   Consults: None  Significant Diagnostic Studies: labs:  CBC and CMP within norma limits; EKG was NSR  Treatments: IV magnesium; Procardia PO  Discharge Exam: Blood pressure 120/72, pulse (!) 101, temperature 98.4 F (36.9 C), temperature source Oral, resp. rate 18, height 5' (1.524 m), weight 80.1 kg, last menstrual period 12/16/2019, SpO2 100 %, unknown if currently breastfeeding. General appearance: alert, cooperative and no distress GI: soft, nontender Extremities: extremities normal, atraumatic, no cyanosis or edema  Disposition: Discharge disposition: 01-Home or Self Care       Discharge Instructions    Activity as tolerated   Complete by: As directed    Call MD for:  difficulty breathing, headache or visual disturbances   Complete by: As directed    Call MD for:  extreme fatigue   Complete by: As directed    Call MD for:  hives   Complete by: As directed    Call MD for:  persistant dizziness or light-headedness   Complete by: As directed    Call MD for:  persistant nausea and vomiting    Complete by: As directed    Call MD for:  redness, tenderness, or signs of infection (pain, swelling, redness, odor or green/yellow discharge around incision site)   Complete by: As directed    Call MD for:  severe uncontrolled pain   Complete by: As directed    Call MD for:  temperature >100.4   Complete by: As directed    Diet - low sodium heart healthy   Complete by: As directed    Discharge instructions   Complete by: As directed    Prescriptions Motrin 600mg  every 6 hours for pain Acetaminophen 650mg  every 6 hours for moderate pain Make sure to not exceed acetaminophen 3000mg  every day.   Lifting restrictions   Complete by: As directed    Weight restriction of 10 lbs.     Allergies as of 09/11/2020      Reactions   Penicillins Itching, Rash   Has patient had a PCN reaction causing immediate rash, facial/tongue/throat swelling, SOB or lightheadedness with hypotension: No Has patient had a PCN reaction causing severe rash involving mucus membranes or skin necrosis: No Has patient had a PCN reaction that required hospitalization: No Has patient had a PCN reaction occurring within the last 10 years: No If all of the above answers are "NO", then may proceed with Cephalosporin use.      Medication List    TAKE these medications   acetaminophen 500 MG tablet Commonly known as: TYLENOL Take 1,000 mg by mouth every 6 (six) hours as needed for mild pain or headache.   butalbital-acetaminophen-caffeine 50-325-40 MG tablet Commonly known as:  FIORICET Take 1 tablet by mouth every 6 (six) hours as needed for headache.   ibuprofen 600 MG tablet Commonly known as: ADVIL Take 1 tablet (600 mg total) by mouth every 6 (six) hours.   NIFEdipine 60 MG 24 hr tablet Commonly known as: ADALAT CC Take 1 tablet (60 mg total) by mouth daily. Start taking on: September 12, 2020   prenatal multivitamin Tabs tablet Take 1 tablet by mouth daily at 12 noon.       Follow-up Information     Charlett Nose, MD Follow up in 4 day(s).   Specialty: Obstetrics and Gynecology Why: BP check Contact information: [address] [place] [address] [phone]               Signed: Charlett Nose 09/11/2020, 12:40 PM

## 2020-09-16 ENCOUNTER — Encounter: Payer: Self-pay | Admitting: Neurology

## 2020-10-25 NOTE — Progress Notes (Signed)
NEUROLOGY CONSULTATION NOTE  Trease Bremner MRN: [id number] DOB: 03-13-1991  Referring provider: Derl Barrow, MD Primary care provider: Virgilio Belling, PA-C  Reason for consult:  headache  Assessment/Plan:   1.  New daily headaches following severe postpartum preeclampsia, still with daily headache 2.  Hypertension - BP elevated today.  Has not yet taken Procardia.  1.  Check MRA/MRV head to rule out sinus thrombosis and aneurysm 2.  Start propranolol 40mg  BID.  We can increase dose in 6 weeks if needed. 3.  Limit use of pain relievers (Tylenol) to no more than 2 days out of week to prevent risk of rebound or medication-overuse headache. 4.  Keep headache diary 5.  Take Procardia 6.  Follow up 6 months.    Subjective:  Kayla [name] is 30 year old right-handed female who presents for headache.  History supplemented by referring provider's notes.  He had a SVD on 09/02/2020. Her pregnancy was complicated by gestational hypertension.  Following delivery, she was feeling fine.  On 09/09/2020 she developed a severe sudden onset 8/10 squeezing pain on the vertex and across the forehead.  She also endorsed chest pain and RUQ pain.  She checked her blood pressure, which was 164/89.  She was admitted to [facility] for postpartum preeclampsia where she was treated with IV magnesium and was started on Procardia XL.  Headache significantly improved and she was discharged a couple of days later with normotensive blood pressure.  She still has a daily headache in the bilateral temporal region and around her ears.  It is a dull bi-temporal headache with throbbing in her ears.  It typically lasts 5 to 10 minutes every 4 hours.  No association with movement or valsalva.  No visual disturbance, numbness, weakness, nausea, vomiting, photophobia or phonophobia.  She initially treated with Fioricet but after she ran out, she started taking Tylenol about every other day.    She denies  any history of headache.  No headaches with previous pregnancies.  She is currently breastfeeding.  At home, blood pressure has been running around 130-135/80s when she takes the Procardia.     CBC and CMP from 5/18 reviewed.  PAST MEDICAL HISTORY: Past Medical History:  Diagnosis Date  . Hernia of abdominal wall 2019  . History of frequent urinary tract infections   . Pregnancy induced hypertension   . Preterm labor     PAST SURGICAL HISTORY: Past Surgical History:  Procedure Laterality Date  . CERVICAL CERCLAGE N/A 07/03/2018   Procedure: CERCLAGE CERVICAL;  Surgeon: 09/01/2018, MD;  Location: [facility] BIRTHING SUITES;  Service: Gynecology;  Laterality: N/A;  . CERVICAL CERCLAGE N/A 03/18/2020   Procedure: CERCLAGE CERVICAL;  Surgeon: 03/20/2020, MD;  Location: [facility] LD ORS;  Service: Gynecology;  Laterality: N/A;  . NO PAST SURGERIES      MEDICATIONS: Current Outpatient Medications on File Prior to Visit  Medication Sig Dispense Refill  . acetaminophen (TYLENOL) 500 MG tablet Take 1,000 mg by mouth every 6 (six) hours as needed for mild pain or headache.    . butalbital-acetaminophen-caffeine (FIORICET) 50-325-40 MG tablet Take 1 tablet by mouth every 6 (six) hours as needed for headache.    . ibuprofen (ADVIL) 600 MG tablet Take 1 tablet (600 mg total) by mouth every 6 (six) hours. 30 tablet 0  . NIFEdipine (ADALAT CC) 60 MG 24 hr tablet Take 1 tablet (60 mg total) by mouth daily. 30 tablet 11  . Prenatal Vit-Fe Fumarate-FA (PRENATAL  MULTIVITAMIN) TABS tablet Take 1 tablet by mouth daily at 12 noon.     No current facility-administered medications on file prior to visit.    ALLERGIES: Allergies  Allergen Reactions  . Penicillins Itching and Rash    Has patient had a PCN reaction causing immediate rash, facial/tongue/throat swelling, SOB or lightheadedness with hypotension: No Has patient had a PCN reaction causing severe rash involving mucus membranes or skin necrosis:  No Has patient had a PCN reaction that required hospitalization: No Has patient had a PCN reaction occurring within the last 10 years: No If all of the above answers are "NO", then may proceed with Cephalosporin use.     FAMILY HISTORY: Family History  Problem Relation Age of Onset  . Heart disease Father 58       AMI  . Cancer Neg Hx   . Diabetes Neg Hx   . Stroke Neg Hx     Objective:  Blood pressure (!) 157/89, pulse 83, height 5' (1.524 m), weight 177 lb (80.3 kg), SpO2 100 %, unknown if currently breastfeeding. General: No acute distress.  Patient appears well-groomed.   Head:  Normocephalic/atraumatic Eyes:  fundi examined but not visualized Neck: supple, no paraspinal tenderness, full range of motion Back: No paraspinal tenderness Heart: regular rate and rhythm Lungs: Clear to auscultation bilaterally. Vascular: No carotid bruits. Neurological Exam: Mental status: alert and oriented to person, place, and time, recent and remote memory intact, fund of knowledge intact, attention and concentration intact, speech fluent and not dysarthric, language intact. Cranial nerves: CN I: not tested CN II: pupils equal, round and reactive to light, visual fields intact CN III, IV, VI:  full range of motion, no nystagmus, no ptosis CN V: facial sensation intact. CN VII: upper and lower face symmetric CN VIII: hearing intact CN IX, X: gag intact, uvula midline CN XI: sternocleidomastoid and trapezius muscles intact CN XII: tongue midline Bulk & Tone: normal, no fasciculations. Motor:  muscle strength 5/5 throughout Sensation:  Pinprick, temperature and vibratory sensation intact. Deep Tendon Reflexes:  2+ throughout,  toes downgoing.   Finger to nose testing:  Without dysmetria.   Heel to shin:  Without dysmetria.   Gait:  Normal station and stride.  Romberg negative.    Thank you for allowing me to take part in the care of this patient.  Shon Millet, DO  CC:  Virgilio Belling, PA-C  Derl Barrow MD

## 2020-10-26 ENCOUNTER — Other Ambulatory Visit: Payer: Self-pay

## 2020-10-26 ENCOUNTER — Ambulatory Visit: Payer: Medicaid Other | Admitting: Neurology

## 2020-10-26 ENCOUNTER — Encounter: Payer: Self-pay | Admitting: Neurology

## 2020-10-26 VITALS — BP 157/89 | HR 83 | Ht 60.0 in | Wt 177.0 lb

## 2020-10-26 DIAGNOSIS — R519 Headache, unspecified: Secondary | ICD-10-CM

## 2020-10-26 DIAGNOSIS — O1495 Unspecified pre-eclampsia, complicating the puerperium: Secondary | ICD-10-CM

## 2020-10-26 DIAGNOSIS — I10 Essential (primary) hypertension: Secondary | ICD-10-CM | POA: Diagnosis not present

## 2020-10-26 MED ORDER — PROPRANOLOL HCL 40 MG PO TABS: 40.0000 mg | ORAL_TABLET | Freq: Two times a day (BID) | ORAL | 5 refills | Status: DC

## 2020-10-26 NOTE — Patient Instructions (Signed)
1.  Start propranolol 40mg  twice daily.  If no improvement in headaches in 6 weeks, contact me 2.  Limit use of pain relievers (such as Tylenol) to no more than 2 days out of week to prevent risk of rebound or medication-overuse headache. 3.  Keep headache diary 4.  Check MRA and MRV of head 5.  Follow up 6 months.

## 2020-11-12 ENCOUNTER — Other Ambulatory Visit: Payer: Medicaid Other

## 2020-11-19 ENCOUNTER — Ambulatory Visit
Admission: RE | Admit: 2020-11-19 | Discharge: 2020-11-19 | Disposition: A | Discharge status: Home / Self Care | Payer: Medicaid Other | Source: Ambulatory Visit | Attending: Neurology | Admitting: Neurology

## 2020-11-19 ENCOUNTER — Other Ambulatory Visit: Payer: Self-pay

## 2020-11-19 DIAGNOSIS — I10 Essential (primary) hypertension: Secondary | ICD-10-CM

## 2020-11-19 DIAGNOSIS — O1495 Unspecified pre-eclampsia, complicating the puerperium: Secondary | ICD-10-CM

## 2020-11-19 DIAGNOSIS — R519 Headache, unspecified: Secondary | ICD-10-CM

## 2020-11-23 ENCOUNTER — Telehealth: Payer: Self-pay

## 2020-11-23 ENCOUNTER — Ambulatory Visit: Payer: 59 | Admitting: Neurology

## 2020-11-23 NOTE — Telephone Encounter (Signed)
-----   Message from Drema Dallas, DO sent at 11/20/2020  9:09 AM EDT ----- MRI of the blood vessels in the brain are normal.  Nothing concerning

## 2020-11-23 NOTE — Telephone Encounter (Signed)
Pt called and informed that MRI of the blood vessels in the brain are normal.  Nothing concerning

## 2020-12-09 ENCOUNTER — Telehealth: Payer: Self-pay | Admitting: Neurology

## 2020-12-09 NOTE — Telephone Encounter (Signed)
Patient called with concerns she is continuing to have migraines. She said she is not breast feeing any more and wants to know if there is another medication she can use.  [facility] [address]

## 2020-12-14 NOTE — Telephone Encounter (Signed)
1.  She is still on a starting dose of propranolol.  So we can increase it to 60mg  twice daily (she is supposed to be on 40mg  twice daily).  She would need to caution for dizziness.  2.  If she is no longer breastfeeding and wishes to change medication, then she can stop propranolol and start nortriptyline 10mg  at bedtime for one week, thenincrease to 20mg  at bedtime.  We can increase dose in 5 weeks if needed.     LVM for pt to call the office back.

## 2020-12-15 NOTE — Telephone Encounter (Signed)
Pt is returning sheena's call. [phone]

## 2020-12-16 MED ORDER — PROPRANOLOL HCL 60 MG PO TABS: 60.0000 mg | ORAL_TABLET | Freq: Two times a day (BID) | ORAL | 2 refills | Status: AC

## 2020-12-16 NOTE — Telephone Encounter (Signed)
Spoken to patient and notified comments.  Patient would like to increase propranolol 60 mg BID. Ok to sent?

## 2020-12-16 NOTE — Telephone Encounter (Signed)
Rx sent as instructed.

## 2020-12-21 ENCOUNTER — Telehealth: Payer: Self-pay

## 2020-12-21 NOTE — Telephone Encounter (Signed)
Left message for patient to resend fmla papers and asked what is needed intermittent, etc.

## 2021-01-13 NOTE — Progress Notes (Signed)
FMLA Form filled out and waiting to be faxed.

## 2021-01-16 ENCOUNTER — Telehealth: Payer: Self-pay | Admitting: Neurology

## 2021-01-16 NOTE — Progress Notes (Signed)
Per Mervin Kung conversation with pt, Called and spoke with patient to let her know her FMLA forms from [employer] were completed and ready for processing.    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   Patient stated she spoke with someone at the office last week and told them but I don't see that documented. FYI only.   Will sherd FMLA.

## 2021-01-16 NOTE — Telephone Encounter (Signed)
Called and spoke with patient to let her know her FMLA forms from [employer] were completed and ready for processing.   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  Patient stated she spoke with someone at the office last week and told them but I don't see that documented. FYI only.

## 2021-04-03 ENCOUNTER — Emergency Department (HOSPITAL_COMMUNITY): Payer: Commercial Managed Care - PPO

## 2021-04-03 ENCOUNTER — Encounter (HOSPITAL_COMMUNITY): Payer: Self-pay

## 2021-04-03 ENCOUNTER — Other Ambulatory Visit: Payer: Self-pay

## 2021-04-03 ENCOUNTER — Emergency Department (HOSPITAL_COMMUNITY)
Admission: EM | Admit: 2021-04-03 | Discharge: 2021-04-03 | Disposition: A | Discharge status: Home / Self Care | Payer: Commercial Managed Care - PPO | Attending: Emergency Medicine | Admitting: Emergency Medicine

## 2021-04-03 DIAGNOSIS — R079 Chest pain, unspecified: Secondary | ICD-10-CM | POA: Diagnosis present

## 2021-04-03 DIAGNOSIS — Z5321 Procedure and treatment not carried out due to patient leaving prior to being seen by health care provider: Secondary | ICD-10-CM | POA: Insufficient documentation

## 2021-04-03 LAB — BASIC METABOLIC PANEL
Anion gap: 9 (ref 5–15)
BUN: 7 mg/dL (ref 6–20)
CO2: 22 mmol/L (ref 22–32)
Calcium: 8.6 mg/dL — ABNORMAL LOW (ref 8.9–10.3)
Chloride: 105 mmol/L (ref 98–111)
Creatinine, Ser: 0.7 mg/dL (ref 0.44–1.00)
GFR, Estimated: >60 mL/min (ref >60)
Glucose, Bld: 113 mg/dL — ABNORMAL HIGH (ref 70–99)
Potassium: 3.5 mmol/L (ref 3.5–5.1)
Sodium: 136 mmol/L (ref 135–145)

## 2021-04-03 LAB — CBC
HCT: 40.5 % (ref 36.0–46.0)
Hemoglobin: 12.9 g/dL (ref 12.0–15.0)
MCH: 26 pg (ref 26.0–34.0)
MCHC: 31.9 g/dL (ref 30.0–36.0)
MCV: 81.5 fL (ref 80.0–100.0)
Platelets: 355 10*3/uL (ref 150–400)
RBC: 4.97 MIL/uL (ref 3.87–5.11)
RDW: 13.7 % (ref 11.5–15.5)
WBC: 7.1 10*3/uL (ref 4.0–10.5)
nRBC: 0 % (ref 0.0–0.2)

## 2021-04-03 LAB — I-STAT BETA HCG BLOOD, ED (MC, WL, AP ONLY): I-stat hCG, quantitative: <5 m[IU]/mL (ref <5)

## 2021-04-03 LAB — TROPONIN I (HIGH SENSITIVITY): Troponin I (High Sensitivity): 4 ng/L (ref <18)

## 2021-04-03 NOTE — ED Notes (Signed)
Pt approached this tech to see where she was in line, I told her there were still 5-7 pt's in front of her, she stated that she would just go to an urgent care instead. Moving pt OTF.

## 2021-04-03 NOTE — ED Triage Notes (Signed)
Patient complains of chest pain with radiation to right shoulder x 3 days, no respiratory complaints, no trauma. Has been taking tylenol with some relief. Nonsmoker.  Describes as intermittent

## 2021-04-15 ENCOUNTER — Other Ambulatory Visit: Payer: Self-pay | Admitting: Neurology

## 2021-05-01 NOTE — Progress Notes (Deleted)
NEUROLOGY FOLLOW UP OFFICE NOTE  Kayla [name] [id number]  Assessment/Plan:   New daily headaches following severe postpartum preeclampsia  Migraine prevention:  *** Migraine rescue:  *** Limit use of pain relievers to no more than 2 days out of week to prevent risk of rebound or medication-overuse headache. Keep headache diary Follow up ***   Subjective:  MALEEAH [name] is 30 year old right-handed female who follows up for daily headaches.  UPDATE: Started propranolol in June. ***  MRA/MRV head on 11/19/2020 personally reviewed were normal.  Current medication:  propranolol 60mg  twice daily, Tylenol   HISTORY: He had a SVD on 09/02/2020. Her pregnancy was complicated by gestational hypertension.  Following delivery, she was feeling fine.  On 09/09/2020 she developed a severe sudden onset 8/10 squeezing pain on the vertex and across the forehead.  She also endorsed chest pain and RUQ pain.  She checked her blood pressure, which was 164/89.  She was admitted to [facility] for postpartum preeclampsia where she was treated with IV magnesium and was started on Procardia XL.  Headache significantly improved and she was discharged a couple of days later with normotensive blood pressure.  She still has a daily headache in the bilateral temporal region and around her ears.  It is a dull bi-temporal headache with throbbing in her ears.  It typically lasts 5 to 10 minutes every 4 hours.  No association with movement or valsalva.  No visual disturbance, numbness, weakness, nausea, vomiting, photophobia or phonophobia.  She initially treated with Fioricet but after she ran out, she started taking Tylenol about every other day.     She denies any history of headache.  No headaches with previous pregnancies.  She is currently breastfeeding.  At home, blood pressure has been running around 130-135/80s when she takes the Procardia.    PAST MEDICAL HISTORY: Past Medical History:   Diagnosis Date   Hernia of abdominal wall 2019   History of frequent urinary tract infections    Pregnancy induced hypertension    Preterm labor     MEDICATIONS: Current Outpatient Medications on File Prior to Visit  Medication Sig Dispense Refill   acetaminophen (TYLENOL) 500 MG tablet Take 1,000 mg by mouth every 6 (six) hours as needed for mild pain or headache.     butalbital-acetaminophen-caffeine (FIORICET) 50-325-40 MG tablet Take 1 tablet by mouth every 6 (six) hours as needed for headache.     ibuprofen (ADVIL) 600 MG tablet Take 1 tablet (600 mg total) by mouth every 6 (six) hours. 30 tablet 0   NIFEdipine (ADALAT CC) 60 MG 24 hr tablet Take 1 tablet (60 mg total) by mouth daily. 30 tablet 11   Prenatal Vit-Fe Fumarate-FA (PRENATAL MULTIVITAMIN) TABS tablet Take 1 tablet by mouth daily at 12 noon.     propranolol (INDERAL) 60 MG tablet Take 1 tablet (60 mg total) by mouth 2 (two) times daily. 60 tablet 2   No current facility-administered medications on file prior to visit.    ALLERGIES: Allergies  Allergen Reactions   Penicillins Itching and Rash    Has patient had a PCN reaction causing immediate rash, facial/tongue/throat swelling, SOB or lightheadedness with hypotension: No Has patient had a PCN reaction causing severe rash involving mucus membranes or skin necrosis: No Has patient had a PCN reaction that required hospitalization: No Has patient had a PCN reaction occurring within the last 10 years: No If all of the above answers are "NO", then may proceed  with Cephalosporin use.     FAMILY HISTORY: Family History  Problem Relation Age of Onset   Heart disease Father 12       AMI   Cancer Neg Hx    Diabetes Neg Hx    Stroke Neg Hx       Objective:  *** General: No acute distress.  Patient appears ***-groomed.   Head:  Normocephalic/atraumatic Eyes:  Fundi examined but not visualized Neck: supple, no paraspinal tenderness, full range of motion Heart:   Regular rate and rhythm Lungs:  Clear to auscultation bilaterally Back: No paraspinal tenderness Neurological Exam: alert and oriented to person, place, and time.  Speech fluent and not dysarthric, language intact.  CN II-XII intact. Bulk and tone normal, muscle strength 5/5 throughout.  Sensation to light touch intact.  Deep tendon reflexes 2+ throughout, toes downgoing.  Finger to nose testing intact.  Gait normal, Romberg negative.   Shon Millet, DO  CC: ***

## 2021-05-02 ENCOUNTER — Ambulatory Visit: Payer: Commercial Managed Care - PPO | Admitting: Neurology

## 2021-07-28 ENCOUNTER — Encounter (HOSPITAL_BASED_OUTPATIENT_CLINIC_OR_DEPARTMENT_OTHER): Payer: Self-pay | Admitting: *Deleted

## 2021-07-28 ENCOUNTER — Emergency Department (HOSPITAL_BASED_OUTPATIENT_CLINIC_OR_DEPARTMENT_OTHER)
Admission: EM | Admit: 2021-07-28 | Discharge: 2021-07-28 | Disposition: A | Discharge status: Home / Self Care | Payer: Commercial Managed Care - PPO | Attending: Emergency Medicine | Admitting: Emergency Medicine

## 2021-07-28 ENCOUNTER — Other Ambulatory Visit: Payer: Self-pay

## 2021-07-28 ENCOUNTER — Emergency Department (HOSPITAL_BASED_OUTPATIENT_CLINIC_OR_DEPARTMENT_OTHER): Payer: Commercial Managed Care - PPO

## 2021-07-28 DIAGNOSIS — R1032 Left lower quadrant pain: Secondary | ICD-10-CM

## 2021-07-28 DIAGNOSIS — K5900 Constipation, unspecified: Secondary | ICD-10-CM | POA: Insufficient documentation

## 2021-07-28 DIAGNOSIS — N83202 Unspecified ovarian cyst, left side: Secondary | ICD-10-CM | POA: Insufficient documentation

## 2021-07-28 DIAGNOSIS — E860 Dehydration: Secondary | ICD-10-CM | POA: Diagnosis not present

## 2021-07-28 LAB — CBC
HCT: 38.1 % (ref 36.0–46.0)
Hemoglobin: 12.2 g/dL (ref 12.0–15.0)
MCH: 26.3 pg (ref 26.0–34.0)
MCHC: 32 g/dL (ref 30.0–36.0)
MCV: 82.1 fL (ref 80.0–100.0)
Platelets: 392 10*3/uL (ref 150–400)
RBC: 4.64 MIL/uL (ref 3.87–5.11)
RDW: 14.1 % (ref 11.5–15.5)
WBC: 12.3 10*3/uL — ABNORMAL HIGH (ref 4.0–10.5)
nRBC: 0 % (ref 0.0–0.2)

## 2021-07-28 LAB — URINALYSIS, MICROSCOPIC (REFLEX): RBC / HPF: NONE SEEN RBC/hpf (ref 0–5)

## 2021-07-28 LAB — URINALYSIS, ROUTINE W REFLEX MICROSCOPIC
Bilirubin Urine: NEGATIVE
Glucose, UA: NEGATIVE mg/dL
Hgb urine dipstick: NEGATIVE
Ketones, ur: 15 mg/dL — AB
Nitrite: NEGATIVE
Protein, ur: NEGATIVE mg/dL
Specific Gravity, Urine: 1.025 (ref 1.005–1.030)
pH: 7 (ref 5.0–8.0)

## 2021-07-28 LAB — COMPREHENSIVE METABOLIC PANEL
ALT: 13 U/L (ref 0–44)
AST: 16 U/L (ref 15–41)
Albumin: 4 g/dL (ref 3.5–5.0)
Alkaline Phosphatase: 64 U/L (ref 38–126)
Anion gap: 8 (ref 5–15)
BUN: 11 mg/dL (ref 6–20)
CO2: 23 mmol/L (ref 22–32)
Calcium: 8.9 mg/dL (ref 8.9–10.3)
Chloride: 106 mmol/L (ref 98–111)
Creatinine, Ser: 0.81 mg/dL (ref 0.44–1.00)
GFR, Estimated: >60 mL/min (ref >60)
Glucose, Bld: 109 mg/dL — ABNORMAL HIGH (ref 70–99)
Potassium: 3.5 mmol/L (ref 3.5–5.1)
Sodium: 137 mmol/L (ref 135–145)
Total Bilirubin: 0.5 mg/dL (ref 0.3–1.2)
Total Protein: 7.4 g/dL (ref 6.5–8.1)

## 2021-07-28 LAB — LIPASE, BLOOD: Lipase: 36 U/L (ref 11–51)

## 2021-07-28 LAB — PREGNANCY, URINE: Preg Test, Ur: NEGATIVE

## 2021-07-28 MED ORDER — IOHEXOL 300 MG/ML  SOLN
100.0000 mL | Freq: Once | INTRAMUSCULAR | Status: AC | PRN
  Administered 2021-07-28: 100 mL via INTRAVENOUS

## 2021-07-28 NOTE — ED Provider Notes (Signed)
[facility] EMERGENCY DEPARTMENT Provider Note   CSN: [id number] Arrival date & time: 07/28/21  1704     History  Chief Complaint  Patient presents with   Abdominal Pain    Kayla [name] is a 31 y.o. female.  The history is provided by the patient and medical records. No language interpreter was used.  Abdominal Pain Pain location:  LLQ and periumbilical Pain quality: aching and cramping   Pain radiates to:  Does not radiate Pain severity:  Severe Onset quality:  Gradual Duration:  4 days Timing:  Constant Progression:  Waxing and waning Chronicity:  New Context: previous surgery   Relieved by:  Nothing Worsened by:  Palpation Ineffective treatments:  None tried Associated symptoms: constipation   Associated symptoms: no chest pain, no chills, no cough, no diarrhea, no dysuria, no fatigue, no fever, no flatus, no melena, no nausea, no shortness of breath, no vaginal bleeding, no vaginal discharge and no vomiting       Home Medications Prior to Admission medications   Medication Sig Start Date End Date Taking? Authorizing Provider  acetaminophen (TYLENOL) 500 MG tablet Take 1,000 mg by mouth every 6 (six) hours as needed for mild pain or headache.    [provider]  butalbital-acetaminophen-caffeine (FIORICET) 50-325-40 MG tablet Take 1 tablet by mouth every 6 (six) hours as needed for headache.    [provider]  ibuprofen (ADVIL) 600 MG tablet Take 1 tablet (600 mg total) by mouth every 6 (six) hours. 09/11/20   Charlett Nose, MD  NIFEdipine (ADALAT CC) 60 MG 24 hr tablet Take 1 tablet (60 mg total) by mouth daily. 09/12/20   Charlett Nose, MD  Prenatal Vit-Fe Fumarate-FA (PRENATAL MULTIVITAMIN) TABS tablet Take 1 tablet by mouth daily at 12 noon.    [provider]  propranolol (INDERAL) 60 MG tablet Take 1 tablet (60 mg total) by mouth 2 (two) times daily. 12/16/20   Drema Dallas, DO      Allergies     Penicillins    Review of Systems   Review of Systems  Constitutional:  Negative for chills, fatigue and fever.  HENT:  Negative for congestion.   Eyes:  Negative for visual disturbance.  Respiratory:  Negative for cough, chest tightness, shortness of breath and wheezing.   Cardiovascular:  Negative for chest pain and palpitations.  Gastrointestinal:  Positive for abdominal pain and constipation. Negative for abdominal distention, diarrhea, flatus, melena, nausea and vomiting.  Genitourinary:  Negative for decreased urine volume, dysuria, flank pain, frequency, pelvic pain, urgency, vaginal bleeding, vaginal discharge and vaginal pain.  Musculoskeletal:  Negative for back pain, neck pain and neck stiffness.  Skin:  Negative for rash and wound.  Neurological:  Negative for light-headedness and headaches.  Psychiatric/Behavioral:  Negative for agitation.   All other systems reviewed and are negative.  Physical Exam Updated Vital Signs BP (!) 155/99 (BP Location: Right Arm)    Pulse (!) 106    Temp 99.2 F (37.3 C) (Oral)    Resp 16    Ht 5' (1.524 m)    Wt 80.3 kg    LMP 07/14/2021    SpO2 100%    BMI 34.57 kg/m  Physical Exam Vitals and nursing note reviewed.  Constitutional:      General: She is not in acute distress.    Appearance: She is well-developed. She is not ill-appearing, toxic-appearing or diaphoretic.  HENT:     Head: Normocephalic and atraumatic.  Eyes:     Conjunctiva/sclera: Conjunctivae normal.  Cardiovascular:     Rate and Rhythm: Normal rate and regular rhythm.     Heart sounds: No murmur heard. Pulmonary:     Effort: Pulmonary effort is normal. No respiratory distress.     Breath sounds: Normal breath sounds.  Abdominal:     General: Abdomen is flat. Bowel sounds are normal. There is no distension.     Palpations: Abdomen is soft.     Tenderness: There is abdominal tenderness in the periumbilical area and left lower quadrant. There is no right CVA  tenderness, left CVA tenderness, guarding or rebound.    Musculoskeletal:        General: No swelling.     Cervical back: Neck supple.  Skin:    General: Skin is warm and dry.     Capillary Refill: Capillary refill takes less than 2 seconds.  Neurological:     General: No focal deficit present.     Mental Status: She is alert.  Psychiatric:        Mood and Affect: Mood normal.    ED Results / Procedures / Treatments   Labs (all labs ordered are listed, but only abnormal results are displayed) Labs Reviewed  COMPREHENSIVE METABOLIC PANEL - Abnormal; Notable for the following components:      Result Value   Glucose, Bld 109 (*)    All other components within normal limits  CBC - Abnormal; Notable for the following components:   WBC 12.3 (*)    All other components within normal limits  URINALYSIS, ROUTINE W REFLEX MICROSCOPIC - Abnormal; Notable for the following components:   Ketones, ur 15 (*)    Leukocytes,Ua TRACE (*)    All other components within normal limits  URINALYSIS, MICROSCOPIC (REFLEX) - Abnormal; Notable for the following components:   Bacteria, UA FEW (*)    All other components within normal limits  URINE CULTURE  LIPASE, BLOOD  PREGNANCY, URINE    EKG None  Radiology CT ABDOMEN PELVIS W CONTRAST  Result Date: 07/28/2021 CLINICAL DATA:  Left lower quadrant pain, initial encounter EXAM: CT ABDOMEN AND PELVIS WITH CONTRAST TECHNIQUE: Multidetector CT imaging of the abdomen and pelvis was performed using the standard protocol following bolus administration of intravenous contrast. RADIATION DOSE REDUCTION: This exam was performed according to the departmental dose-optimization program which includes automated exposure control, adjustment of the mA and/or kV according to patient size and/or use of iterative reconstruction technique. CONTRAST:  100mL OMNIPAQUE IOHEXOL 300 MG/ML  SOLN COMPARISON:  None. FINDINGS: Lower chest: No acute abnormality. Hepatobiliary:  No focal liver abnormality is seen. No gallstones, gallbladder wall thickening, or biliary dilatation. Pancreas: Unremarkable. No pancreatic ductal dilatation or surrounding inflammatory changes. Spleen: Normal in size without focal abnormality. Adrenals/Urinary Tract: Adrenal glands are within normal limits. Kidneys demonstrate a normal enhancement pattern bilaterally. No renal calculi or obstructive changes are seen. The ureters are within normal limits. Bladder is decompressed. Stomach/Bowel: Appendix is well visualized and within normal limits. No obstructive or inflammatory changes of the colon are seen. Small bowel and stomach are within normal limits. Vascular/Lymphatic: Duplicated IVC is noted. No significant atherosclerotic calcifications are noted. No significant lymphadenopathy is identified. Reproductive: Uterus is within normal limits. Right adnexa appears unremarkable. Left adnexa demonstrates a dominant simple appearing cyst measuring up to 4.6 cm. Other: No abdominal wall hernia or abnormality. No abdominopelvic ascites. Musculoskeletal: No acute or significant osseous findings. IMPRESSION: 4.6 cm left adnexal simple-appearing cyst. No  follow-up imaging is recommended. Reference: JACR 2020 Feb;17(2):248-254 No evidence of diverticular disease. 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 No other focal abnormality is seen. Electronically Signed   By: Alcide Clever M.D.   On: 07/28/2021 19:38    Procedures Procedures    Medications Ordered in ED Medications  iohexol (OMNIPAQUE) 300 MG/ML solution 100 mL (100 mLs Intravenous Contrast Given 07/28/21 1919)    ED Course/ Medical Decision Making/ A&P                           Medical Decision Making Amount and/or Complexity of Data Reviewed Labs: ordered. Radiology: ordered.  Risk Prescription drug management.    Finn Altemose is a 31 y.o. female with a past medical history significant for previous abdominal hernia status  postrepair, previous urinary tract infection, and previous diastases recti who presents with constipation and left-sided abdominal pain.  According to patient, for the last 4 to 5 days she is having worsening pain in her left lower quadrant and left central abdomen.  She reports that this feels new.  She denies any trauma.  She says that she has had some constipation and only has had minimal bowel movement in the last few days.  She is passing minimal gas.  She reports a family history of diverticulitis but does not have a history herself that she knows of.  She denies any vaginal symptoms including vaginal discharge or bleeding.  No pelvic pain.  She denies any previous sexual transmitted infections.  Denies any hematuria, dysuria, or urinary symptoms.  Denies any rashes to suggest shingles.  No other chest pain, shortness of breath, nausea, vomiting, or other complaints.  Patient primarily wants to make sure is not diverticulitis or obstruction given her previous abdominal surgery.  She describes the pain as moderate to severe at times and has been constant since Tuesday.  On exam, lungs clear.  Chest nontender.  Bowel sounds are appreciated.  She is tender in her left lower and central abdomen going to her umbilicus.  No rashes seen.  No flank tenderness or back pain.  Legs nontender and nonedematous.  Exam otherwise unremarkable.  Patient had some screening blood work in triage that did show a leukocytosis but otherwise was reassuring.  She had no nitrites and given her lack of urinary symptoms, doubt UTI.  No blood to suggest kidney stone.  She is slightly dehydrated with some ketones likely.  She is not pregnant and lipase is normal.  Due to her history of abdominal surgery and this pain, will get a CT scan with contrast to look for obstruction versus ileus versus diverticulitis or just constipation.  She wanted to wait for any medications and we will reassess after imaging is completed.   CT scan  showed no evidence of diverticulitis or obstruction.  There is a 4.6 cm left ovarian cyst but otherwise it appeared simple and not inflamed.  I informed the patient of these findings and offered to do pelvic exam and pelvic ultrasound but she would rather call her OB/GYN to get this done.  She reports the pain has improved and she is resting comfortably.  Clinically I do suspect she is having some constipation contributing to her left-sided abdominal pain but we discussed the possibility of torsion or painful cyst and she still wants to see her OB/GYN instead of having [place] do the pelvic exam at this time.  Given her well appearance and reassuring  vital signs now, I feel she is safe for discharge home.  Patient understood return precautions and follow-up instructions and was discharged in good condition.          Final Clinical Impression(s) / ED Diagnoses Final diagnoses:  LLQ abdominal pain  Constipation, unspecified constipation type  Cyst of left ovary    Rx / DC Orders ED Discharge Orders     None       Clinical Impression: 1. LLQ abdominal pain   2. Constipation, unspecified constipation type   3. Cyst of left ovary     Disposition: Discharge  Condition: Good  I have discussed the results, Dx and Tx plan with the pt(& family if present). He/she/they expressed understanding and agree(s) with the plan. Discharge instructions discussed at great length. Strict return precautions discussed and pt &/or family have verbalized understanding of the instructions. No further questions at time of discharge.    New Prescriptions   No medications on file    Follow Up: Chyrel Masson [address] [place] [address] [phone]     your [facility] [address] [id number] YQ [facility] [place] [address] [phone]       Raziel Koenigs, Canary Brim, MD 07/28/21 [phone]

## 2021-07-28 NOTE — Discharge Instructions (Signed)
Your work-up today did not show evidence of obstruction, recurrent hernia, or diverticulitis.  The CT did show that you have a left-sided ovarian cyst.  We had a discussion together and offered pelvic exam and ultrasound to rule out torsion or other complication but you would prefer your OB/GYN to do this.  Given your improvement in symptoms and reassuring work-up otherwise, we feel this is reasonable.  Please rest and stay hydrated.  If any symptoms change or worsen acutely, please return to the nearest emergency department for further evaluation and work-up ?

## 2021-07-28 NOTE — ED Triage Notes (Signed)
Abdominal pain x 4 days 

## 2021-07-28 NOTE — ED Notes (Signed)
Patient returned from CT at this time.

## 2021-07-28 NOTE — ED Notes (Signed)
States her BM pattern is very decreased and also not having as much flatus as well. Abd is soft and does not appear to be distended at this time. BS x 4 are easily auscultated as hyperactive ?

## 2021-07-28 NOTE — ED Notes (Signed)
Has abd pain, cramping on left side of abd and periumbilical, worse at night, onset this past Monday am. Has been taking PO Tylenol, stated she had no relief. Denies fevers, nausea, vomiting or diarrhea, states she does feel fatigued at times. Appetite good. Able to keep POs down without issues ?

## 2021-07-28 NOTE — ED Notes (Signed)
Patient taken to CT at this time.

## 2021-07-30 LAB — URINE CULTURE: Culture: 40000 — AB

## 2021-07-31 ENCOUNTER — Telehealth (HOSPITAL_BASED_OUTPATIENT_CLINIC_OR_DEPARTMENT_OTHER): Payer: Self-pay | Admitting: *Deleted

## 2021-07-31 NOTE — Telephone Encounter (Signed)
Post ED Visit - Positive Culture Follow-up ? ?Culture report reviewed by antimicrobial stewardship pharmacist: ?[facility] ?[]  , Enzo Bi.D. ?[]  [address], .D., BCPS AQ-ID ?[]  Celedonio Miyamoto, Pharm.D., BCPS ?[]  [address], .D., BCPS ?[]  [place], .D., BCPS, AAHIVP ?[]  Georgina Pillion, Pharm.D., BCPS, AAHIVP ?[]  [address], PharmD, BCPS ?[]  , PharmD, BCPS ?[]  [place], PharmD, BCPS ?[]  [address], PharmD ?[]  , PharmD, BCPS ?[x]  Estella Husk, PharmD ? ? [facility] ?[]  Lysle Pearl, PharmD ?[]  , PharmD ?[]  Phillips Climes, PharmD ?[]  , Rph ?[]  Agapito Games) , PharmD ?[]  Verlan Friends, PharmD ?[]  , PharmD ?[]  Mervyn Gay, PharmD ?[]  , PharmD ?[]  Maxwell Marion, PharmD ?[]  Gerri Spore, PharmD ?[]  , PharmD ?[]  Len Childs, PharmD ? ? ?Positive urine culture ?no further patient follow-up is required at this time. ? ? ?07/31/2021, 10:54 AM ?  ?

## 2022-09-13 ENCOUNTER — Emergency Department (HOSPITAL_COMMUNITY): Payer: Commercial Managed Care - PPO

## 2022-09-13 ENCOUNTER — Emergency Department (HOSPITAL_COMMUNITY)
Admission: EM | Admit: 2022-09-13 | Discharge: 2022-09-14 | Discharge status: Against Medical Advice | Payer: Commercial Managed Care - PPO | Attending: Emergency Medicine | Admitting: Emergency Medicine

## 2022-09-13 ENCOUNTER — Other Ambulatory Visit: Payer: Self-pay

## 2022-09-13 ENCOUNTER — Encounter (HOSPITAL_COMMUNITY): Payer: Self-pay | Admitting: Emergency Medicine

## 2022-09-13 DIAGNOSIS — R079 Chest pain, unspecified: Secondary | ICD-10-CM | POA: Insufficient documentation

## 2022-09-13 DIAGNOSIS — Z5321 Procedure and treatment not carried out due to patient leaving prior to being seen by health care provider: Secondary | ICD-10-CM | POA: Diagnosis not present

## 2022-09-13 LAB — CBC
HCT: 35.3 % — ABNORMAL LOW (ref 36.0–46.0)
Hemoglobin: 11.3 g/dL — ABNORMAL LOW (ref 12.0–15.0)
MCH: 26.1 pg (ref 26.0–34.0)
MCHC: 32 g/dL (ref 30.0–36.0)
MCV: 81.5 fL (ref 80.0–100.0)
Platelets: 385 10*3/uL (ref 150–400)
RBC: 4.33 MIL/uL (ref 3.87–5.11)
RDW: 13.9 % (ref 11.5–15.5)
WBC: 11.4 10*3/uL — ABNORMAL HIGH (ref 4.0–10.5)
nRBC: 0 % (ref 0.0–0.2)

## 2022-09-13 LAB — BASIC METABOLIC PANEL
Anion gap: 10 (ref 5–15)
BUN: 12 mg/dL (ref 6–20)
CO2: 21 mmol/L — ABNORMAL LOW (ref 22–32)
Calcium: 9 mg/dL (ref 8.9–10.3)
Chloride: 103 mmol/L (ref 98–111)
Creatinine, Ser: 0.77 mg/dL (ref 0.44–1.00)
GFR, Estimated: >60 mL/min (ref >60)
Glucose, Bld: 117 mg/dL — ABNORMAL HIGH (ref 70–99)
Potassium: 3.7 mmol/L (ref 3.5–5.1)
Sodium: 134 mmol/L — ABNORMAL LOW (ref 135–145)

## 2022-09-13 LAB — TROPONIN I (HIGH SENSITIVITY): Troponin I (High Sensitivity): 3 ng/L (ref <18)

## 2022-09-13 NOTE — ED Triage Notes (Signed)
Pt c/o centralized chest pain that radiates to left side for approx 2 weeks. Denies shob, n/v.

## 2022-09-13 NOTE — ED Provider Triage Note (Signed)
Emergency Medicine Provider Triage Evaluation Note  Carlei Huang , a 32 y.o. female  was evaluated in triage.  Pt complains of chest pain.  Started 2 weeks ago.  Located in the left chest radiates to her left breast and to her left shoulder.  Denies associated shortness of breath.  States it feels sharp and is intermittent.  The chest pain was worse this morning which is what prompted her evaluation today.  Denies cough congestion.  Denies any trauma to the chest.  Review of Systems  Positive: See above Negative: See above  Physical Exam  BP (!) 145/88 (BP Location: Right Arm)   Pulse 74   Temp 99 F (37.2 C)   Resp 16   SpO2 99%  Gen:   Awake, no distress   Resp:  Normal effort  MSK:   Moves extremities without difficulty  Other:  TTP to the left chest and upper half of the left breast  Medical Decision Making  Medically screening exam initiated at 5:06 PM.  Appropriate orders placed.  Georjean Mode was informed that the remainder of the evaluation will be completed by another provider, this initial triage assessment does not replace that evaluation, and the importance of remaining in the ED until their evaluation is complete.  Work up started   Gareth Eagle, PA-C 09/13/22 1708

## 2022-09-13 NOTE — ED Notes (Signed)
Called for bloodwork x2 with no answer

## 2022-09-14 NOTE — ED Notes (Signed)
NA x2

## 2022-11-02 ENCOUNTER — Other Ambulatory Visit: Payer: Self-pay

## 2022-11-02 ENCOUNTER — Emergency Department (HOSPITAL_BASED_OUTPATIENT_CLINIC_OR_DEPARTMENT_OTHER)
Admission: EM | Admit: 2022-11-02 | Discharge: 2022-11-02 | Disposition: A | Discharge status: Home / Self Care | Payer: Commercial Managed Care - PPO | Attending: Emergency Medicine | Admitting: Emergency Medicine

## 2022-11-02 ENCOUNTER — Encounter (HOSPITAL_BASED_OUTPATIENT_CLINIC_OR_DEPARTMENT_OTHER): Payer: Self-pay

## 2022-11-02 ENCOUNTER — Emergency Department (HOSPITAL_BASED_OUTPATIENT_CLINIC_OR_DEPARTMENT_OTHER): Payer: Commercial Managed Care - PPO

## 2022-11-02 ENCOUNTER — Emergency Department (HOSPITAL_BASED_OUTPATIENT_CLINIC_OR_DEPARTMENT_OTHER): Payer: Commercial Managed Care - PPO | Admitting: Radiology

## 2022-11-02 DIAGNOSIS — R0789 Other chest pain: Secondary | ICD-10-CM

## 2022-11-02 DIAGNOSIS — R109 Unspecified abdominal pain: Secondary | ICD-10-CM

## 2022-11-02 DIAGNOSIS — R079 Chest pain, unspecified: Secondary | ICD-10-CM | POA: Diagnosis present

## 2022-11-02 DIAGNOSIS — R1013 Epigastric pain: Secondary | ICD-10-CM | POA: Insufficient documentation

## 2022-11-02 LAB — COMPREHENSIVE METABOLIC PANEL
ALT: 10 U/L (ref 0–44)
AST: 20 U/L (ref 15–41)
Albumin: 4.4 g/dL (ref 3.5–5.0)
Alkaline Phosphatase: 60 U/L (ref 38–126)
Anion gap: 9 (ref 5–15)
BUN: 12 mg/dL (ref 6–20)
CO2: 23 mmol/L (ref 22–32)
Calcium: 9.1 mg/dL (ref 8.9–10.3)
Chloride: 106 mmol/L (ref 98–111)
Creatinine, Ser: 0.8 mg/dL (ref 0.44–1.00)
GFR, Estimated: >60 mL/min (ref >60)
Glucose, Bld: 88 mg/dL (ref 70–99)
Potassium: 3.6 mmol/L (ref 3.5–5.1)
Sodium: 138 mmol/L (ref 135–145)
Total Bilirubin: 0.3 mg/dL (ref 0.3–1.2)
Total Protein: 7 g/dL (ref 6.5–8.1)

## 2022-11-02 LAB — URINALYSIS, ROUTINE W REFLEX MICROSCOPIC
Bilirubin Urine: NEGATIVE
Glucose, UA: NEGATIVE mg/dL
Hgb urine dipstick: NEGATIVE
Ketones, ur: NEGATIVE mg/dL
Leukocytes,Ua: NEGATIVE
Nitrite: NEGATIVE
Specific Gravity, Urine: 1.029 (ref 1.005–1.030)
pH: 7.5 (ref 5.0–8.0)

## 2022-11-02 LAB — CBC
HCT: 36.7 % (ref 36.0–46.0)
Hemoglobin: 11.9 g/dL — ABNORMAL LOW (ref 12.0–15.0)
MCH: 26 pg (ref 26.0–34.0)
MCHC: 32.4 g/dL (ref 30.0–36.0)
MCV: 80.3 fL (ref 80.0–100.0)
Platelets: 399 10*3/uL (ref 150–400)
RBC: 4.57 MIL/uL (ref 3.87–5.11)
RDW: 14.4 % (ref 11.5–15.5)
WBC: 10.7 10*3/uL — ABNORMAL HIGH (ref 4.0–10.5)
nRBC: 0 % (ref 0.0–0.2)

## 2022-11-02 LAB — TROPONIN I (HIGH SENSITIVITY)
Troponin I (High Sensitivity): 2 ng/L (ref <18)
Troponin I (High Sensitivity): 2 ng/L (ref <18)

## 2022-11-02 LAB — LIPASE, BLOOD: Lipase: 43 U/L (ref 11–51)

## 2022-11-02 LAB — PREGNANCY, URINE: Preg Test, Ur: NEGATIVE

## 2022-11-02 MED ORDER — HYOSCYAMINE SULFATE 0.125 MG SL SUBL
0.1250 mg | SUBLINGUAL_TABLET | Freq: Once | SUBLINGUAL | Status: AC
  Administered 2022-11-02: 0.125 mg via SUBLINGUAL
  Filled 2022-11-02: qty 1

## 2022-11-02 MED ORDER — ESOMEPRAZOLE MAGNESIUM 20 MG PO CPDR: 20.0000 mg | DELAYED_RELEASE_CAPSULE | Freq: Every day | ORAL | 0 refills | Status: AC

## 2022-11-02 MED ORDER — HYOSCYAMINE SULFATE 0.125 MG SL SUBL: 0.1250 mg | SUBLINGUAL_TABLET | SUBLINGUAL | 0 refills | Status: AC | PRN

## 2022-11-02 NOTE — ED Notes (Signed)
RN reviewed discharge instructions with pt. Pt verbalized understanding and had no further questions. VSS upon discharge.  

## 2022-11-02 NOTE — ED Triage Notes (Signed)
Pt POV from home reporting L side chest pain and upper abd cramping that began about 4 weeks ago. Denies injury or SOB. NAD noted in triage

## 2022-11-02 NOTE — Discharge Instructions (Addendum)

## 2022-11-02 NOTE — ED Provider Notes (Signed)
[place] EMERGENCY DEPARTMENT AT [facility] Provider Note   CSN: [id number] Arrival date & time: 11/02/22  1652     History  Chief Complaint  Patient presents with   Chest Pain   Abdominal Pain    Kayla [name] is a 32 y.o. female.abd pain and and chest pain. Onset 1 month ago. She describes the pain as occurring daily, It comes and goes. Wakes her from sleep. Pain is in the epigastrium and BLUQs/. Described as cramping. No aggravating or alleviating factors. Pain refers to her chest. No association with eating. No exertional sxs. No n/v/diarrhea or constipation. Has tried heating pad., tylenol, motrin without relief.    Chest Pain Associated symptoms: abdominal pain   Abdominal Pain Associated symptoms: chest pain        Home Medications Prior to Admission medications   Medication Sig Start Date End Date Taking? Authorizing Provider  acetaminophen (TYLENOL) 500 MG tablet Take 1,000 mg by mouth every 6 (six) hours as needed for mild pain or headache.    [provider]  butalbital-acetaminophen-caffeine (FIORICET) 50-325-40 MG tablet Take 1 tablet by mouth every 6 (six) hours as needed for headache.    [provider]  ibuprofen (ADVIL) 600 MG tablet Take 1 tablet (600 mg total) by mouth every 6 (six) hours. 09/11/20   Charlett Nose, MD  NIFEdipine (ADALAT CC) 60 MG 24 hr tablet Take 1 tablet (60 mg total) by mouth daily. 09/12/20   Charlett Nose, MD  Prenatal Vit-Fe Fumarate-FA (PRENATAL MULTIVITAMIN) TABS tablet Take 1 tablet by mouth daily at 12 noon.    [provider]  propranolol (INDERAL) 60 MG tablet Take 1 tablet (60 mg total) by mouth 2 (two) times daily. 12/16/20   Drema Dallas, DO      Allergies    Penicillins    Review of Systems   Review of Systems  Cardiovascular:  Positive for chest pain.  Gastrointestinal:  Positive for abdominal pain.    Physical Exam Updated Vital Signs BP (!) 140/89   Pulse  90   Temp 99.7 F (37.6 C) (Oral)   Resp 20   Ht 5\' 1"  (1.549 m)   Wt 82.1 kg   LMP 10/16/2022 (Exact Date)   SpO2 100%   BMI 34.20 kg/m  Physical Exam Vitals and nursing note reviewed.  Constitutional:      General: She is not in acute distress.    Appearance: She is well-developed. She is not diaphoretic.  HENT:     Head: Normocephalic and atraumatic.     Right Ear: External ear normal.     Left Ear: External ear normal.     Nose: Nose normal.     Mouth/Throat:     Mouth: Mucous membranes are moist.  Eyes:     General: No scleral icterus.    Conjunctiva/sclera: Conjunctivae normal.  Cardiovascular:     Rate and Rhythm: Normal rate and regular rhythm.     Heart sounds: Normal heart sounds. No murmur heard.    No friction rub. No gallop.  Pulmonary:     Effort: Pulmonary effort is normal. No respiratory distress.     Breath sounds: Normal breath sounds.  Abdominal:     General: Bowel sounds are normal. There is no distension.     Palpations: Abdomen is soft. There is no mass.     Tenderness: There is no abdominal tenderness. There is no guarding.  Musculoskeletal:     Cervical back:  Normal range of motion.  Skin:    General: Skin is warm and dry.  Neurological:     Mental Status: She is alert and oriented to person, place, and time.  Psychiatric:        Behavior: Behavior normal.     ED Results / Procedures / Treatments   Labs (all labs ordered are listed, but only abnormal results are displayed) Labs Reviewed  CBC - Abnormal; Notable for the following components:      Result Value   WBC 10.7 (*)    Hemoglobin 11.9 (*)    All other components within normal limits  URINALYSIS, ROUTINE W REFLEX MICROSCOPIC - Abnormal; Notable for the following components:   Protein, ur TRACE (*)    All other components within normal limits  PREGNANCY, URINE  LIPASE, BLOOD  COMPREHENSIVE METABOLIC PANEL  TROPONIN I (HIGH SENSITIVITY)    EKG None  Radiology No results  found.  Procedures Procedures    Medications Ordered in ED Medications  hyoscyamine (LEVSIN SL) SL tablet 0.125 mg (has no administration in time range)    ED Course/ Medical Decision Making/ A&P Clinical Course as of 11/02/22 2021  Fri Nov 02, 2022  1833 DG Chest 2 View [AH]  1833 EKG 12-Lead [AH]  2014 Urinalysis, Routine w reflex microscopic -Urine, Clean Catch(!) [AH]  2014 WBC(!): 10.7 [AH]  2014 Preg Test, Ur: NEGATIVE [AH]  2014 Troponin I (High Sensitivity) [AH]    Clinical Course User Index [AH] Arthor Captain, PA-C                             Medical Decision Making Amount and/or Complexity of Data Reviewed Labs: ordered. Decision-making details documented in ED Course. Radiology: ordered. Decision-making details documented in ED Course. ECG/medicine tests:  Decision-making details documented in ED Course.  Risk Prescription drug management.   . This patient presents to the ED for concern of epigastric pain, this involves an extensive number of treatment options, and is a complaint that carries with it a high risk of complications and morbidity.  Differential diagnosis of epigastric pain includes: Functional or nonulcer dyspepsia PUD, GERD, Gastritis, (NSAIDs, alcohol, stress, H. pylori, pernicious anemia), pancreatitis or pancreatic cancer, overeating indigestion (high-fat foods, coffee), drugs (aspirin, antibiotics (eg, macrolides, metronidazole), corticosteroids, digoxin, narcotics, theophylline), gastroparesis, lactose intolerance, malabsorption gastric cancer, parasitic infection, (Giardia, Strongyloides, Ascaris) cholelithiasis, choledocholithiasis, or cholangitis, ACS, pericarditis, pneumonia, abdominal hernia, pregnancy, intestinal ischemia, esophageal rupture, gastric volvulus, hepatitis.   Co morbidities: n/a  Social Determinants of Health:   SDOH Screenings   Depression (PHQ2-9): Low Risk  (05/18/2019)  Tobacco Use: Low Risk  (11/02/2022)      Additional history:     Lab Tests:  I Ordered, and personally interpreted labs.  The pertinent results include:   I reviewed all labs.  White count 10.7, insignificant hemoglobin 11.9 and menstrual female.  Remainder of labs are unremarkable  Imaging Studies:  I ordered imaging studies including chest x-ray and right upper quadrant abdominal ultrasound I independently visualized and interpreted imaging which showed no acute finding I agree with the radiologist interpretation  Cardiac Monitoring/ECG:  The patient was maintained on a cardiac monitor.  I personally viewed and interpreted the cardiac monitored which showed an underlying rhythm of: Normal sinus rhythm  Medicines ordered and prescription drug management:  I ordered medication including  Medications  hyoscyamine (LEVSIN SL) SL tablet 0.125 mg (0.125 mg Sublingual Given 11/02/22 1805)  for abdominal cramping Reevaluation of the patient after these medicines showed that the patient stayed the same I have reviewed the patients home medicines and have made adjustments as needed  Test Considered:   CT of the abdomen however she has no tenderness at all on exam  Critical Interventions:    Consultations Obtained:   Problem List / ED Course:     ICD-10-CM   1. Stomach cramps  R10.9     2. Chest discomfort  R07.89       MDM: Patient here with epigastric abdominal pain that occasionally radiates into her abdomen.  I ordered an ultrasound which shows no evidence of cholelithiasis or cholecystitis.  She has no evidence of cardiac abnormality or ACS and is low risk.  She has a benign abdominal exam that does not warrant further imaging at this time.  Patient may have peptic ulcer disease or biliary colic and need further evaluation with gastroenterology.  Referral given.  Will treat with Nexium and Levsin.  Discussed outpatient follow-up and return precautions.   Dispostion:  After consideration of the  diagnostic results and the patients response to treatment, I feel that the patent would benefit from outpatient workup.        Final Clinical Impression(s) / ED Diagnoses Final diagnoses:  None    Rx / DC Orders ED Discharge Orders          Ordered    hyoscyamine (LEVSIN/SL) 0.125 MG SL tablet  Every 4 hours PRN        11/02/22 2021    esomeprazole (NEXIUM) 20 MG capsule  Daily        11/02/22 2021              Arthor Captain, PA-C 11/02/22 2327    Sloan Leiter, DO 11/03/22 0010

## 2022-11-02 NOTE — ED Notes (Signed)
ED Provider at bedside. 

## 2022-11-03 IMAGING — MR MR MRV HEAD W/O CM
3 series · 23 of 48 positions shown · non-contrast
Comparison: None available.

CLINICAL DATA: Initial evaluation for daily headaches/migraines for
3-4 months.

EXAM:
MR VENOGRAM HEAD WITHOUT CONTRAST
TECHNIQUE: Angiographic images of the intracranial venous structures were
acquired using MRV technique without intravenous contrast.

[Series 2: (id) coronal · coronal · 3.0mm · 0.51mm/px · 13 of 106 slices shown]
[im 1/106]
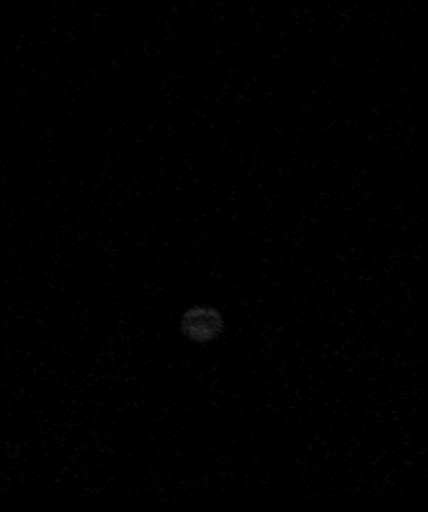
[im 6/106]
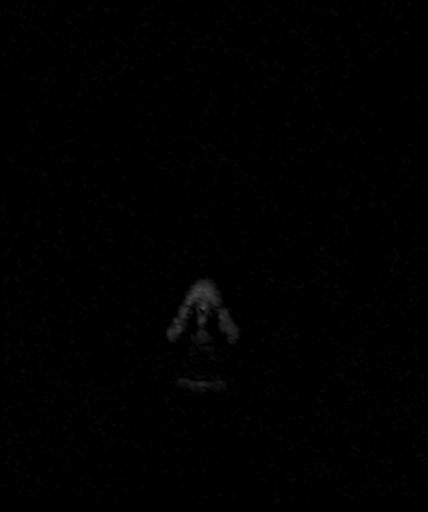
[im 11/106]
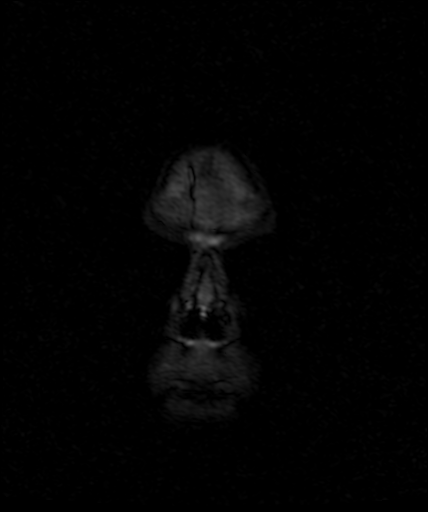
[im 16/106]
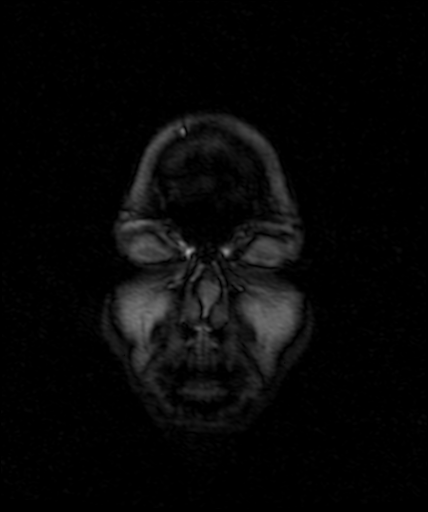
[im 21/106]
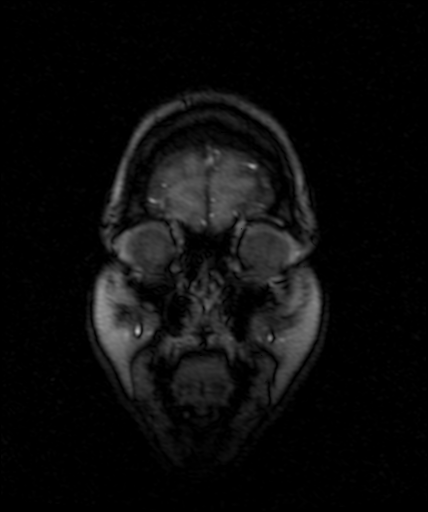
[im 31/106]
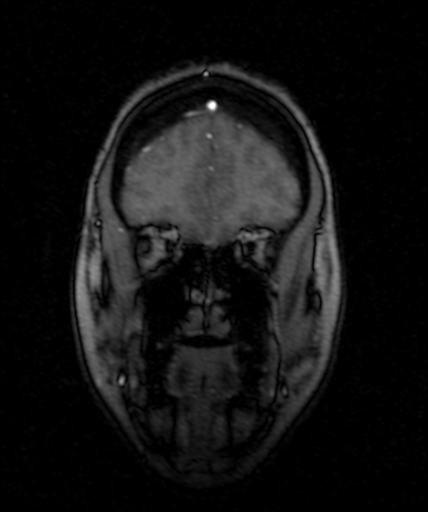
[im 46/106]
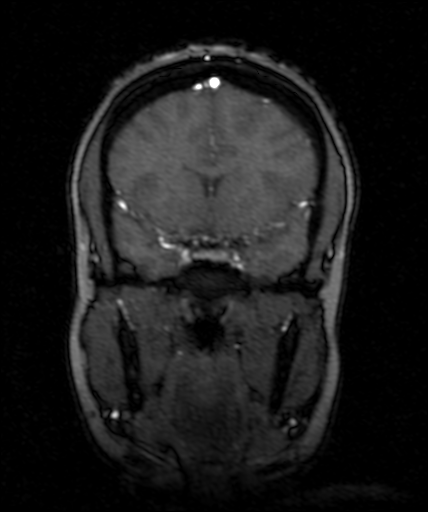
[im 56/106]
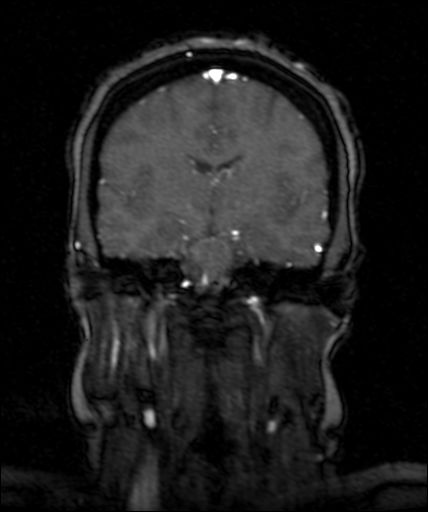
[im 61/106]
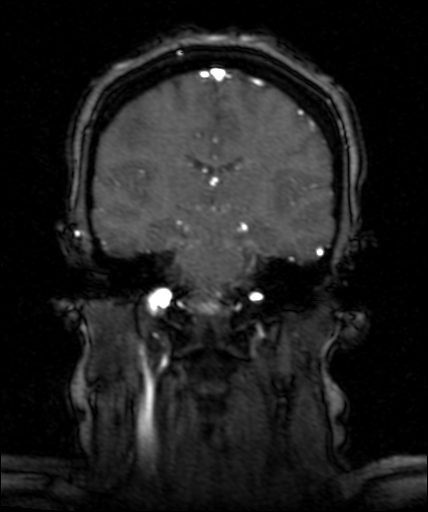
[im 76/106]
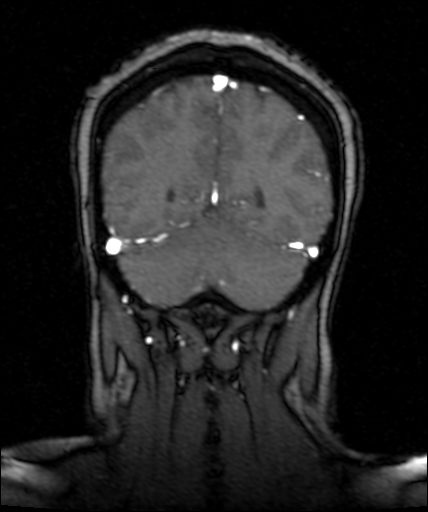
[im 86/106]
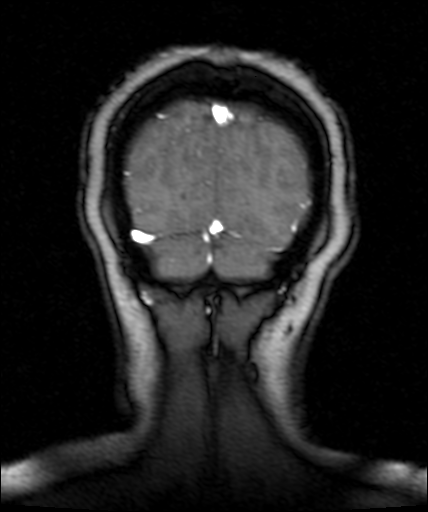
[im 91/106]
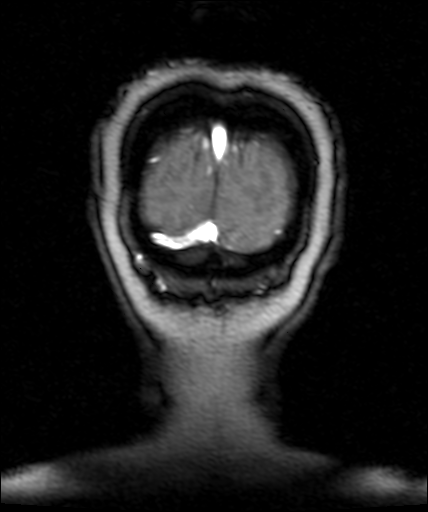
[im 101/106]
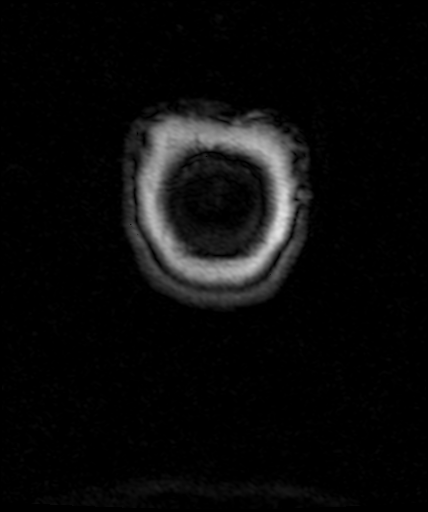

[Series 6: tof_3d cor · coronal · 1.5mm · 0.43mm/px · 9 of 118 slices shown]
[im 5/118]
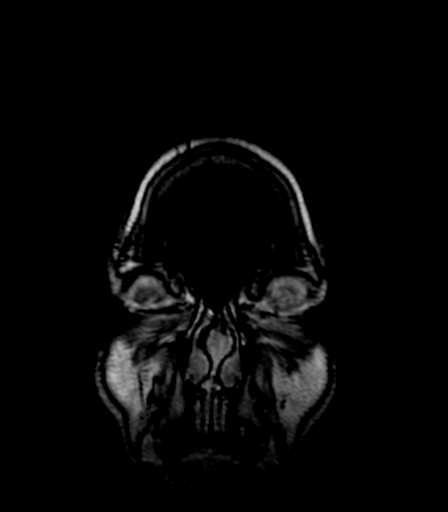
[im 20/118]
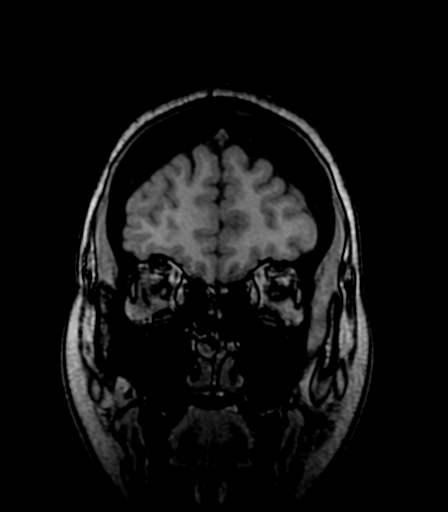
[im 35/118]
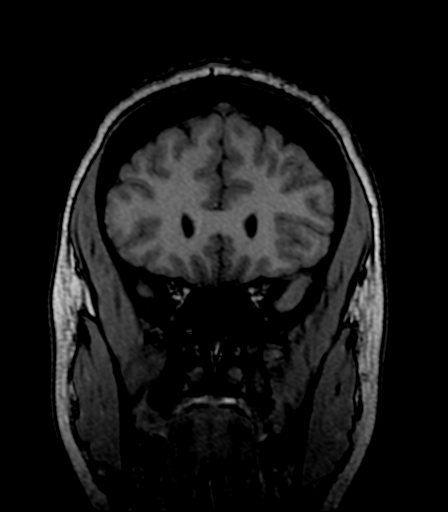
[im 49/118]
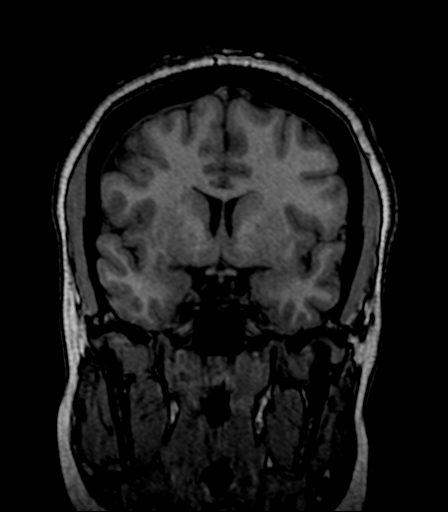
[im 59/118]
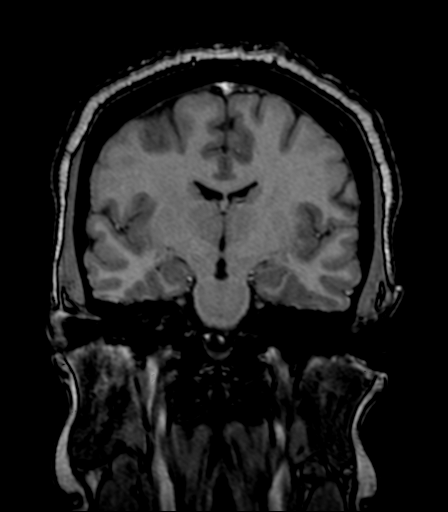
[im 69/118]
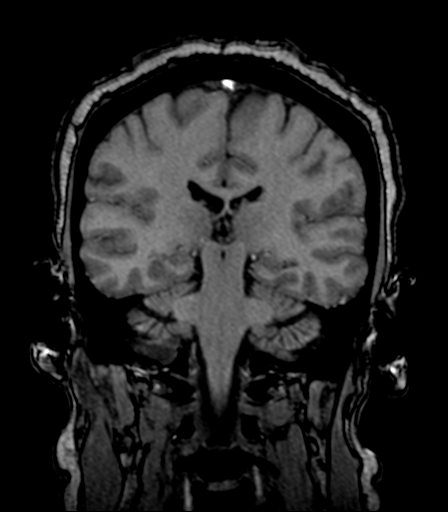
[im 83/118]
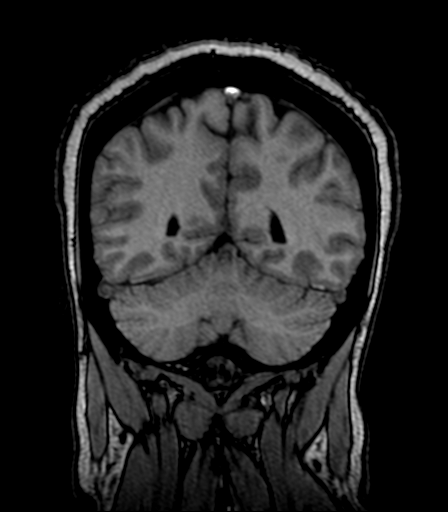
[im 98/118]
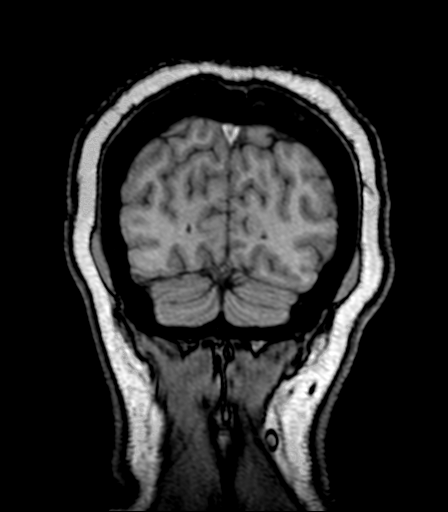
[im 113/118]
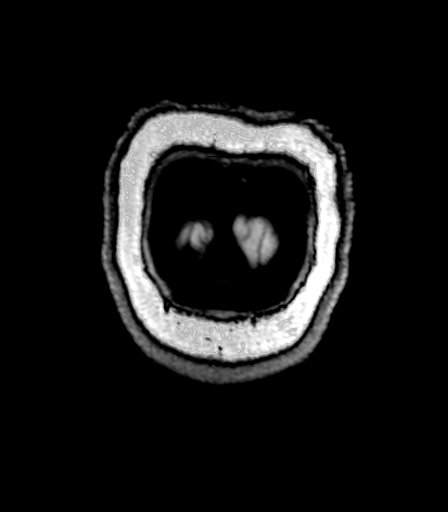

[Series 9: tof_3d cor_mip_tra · axial · 220.0mm · 0.43mm/px · 1 of 1 slices shown]
[im 1/1]
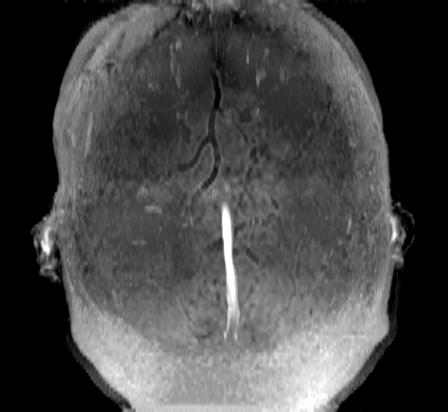

[23 of 48 positions shown; findings below may reference images not displayed]

FINDINGS: Normal flow related signal seen throughout the superior sagittal
sinus to the torcula. Strongly dominant right transverse sinus
widely patent. Right sigmoid sinus patent as is the visualized
proximal right internal jugular vein. Left transverse sinus markedly
hypoplastic, only visualized beyond the vein of Labbe. Visualized
left transverse and sigmoid sinuses are patent as is the visualized
proximal left internal jugular vein. Straight sinus, vein of Palmieri,
internal cerebral veins, and basal veins of Melody appear patent.
No appreciable cortical vein thrombosis. No dural sinus stenosis or
empty sella.
IMPRESSION: Normal intracranial MRV.

## 2022-11-03 IMAGING — MR MR MRA HEAD W/O CM
1 series · 23 of 48 positions shown · non-contrast
Comparison: None available.

CLINICAL DATA: Initial evaluation for daily headaches, migraines
for 3-4 months.

EXAM:
MRA HEAD WITHOUT CONTRAST
TECHNIQUE: Angiographic images of the Circle of Willis were acquired using MRA
technique without intravenous contrast.

[Series 3: tof_3d_multi-slab · axial · 0.7mm · 0.35mm/px · z∈[-96,+6]mm · 23 of 156 slices shown]
[im 1/156]
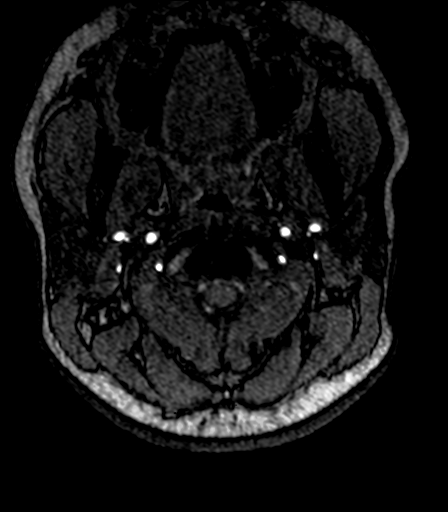
[im 4/156]
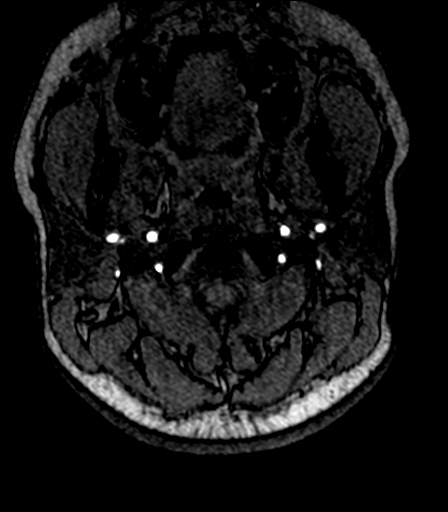
[im 7/156]
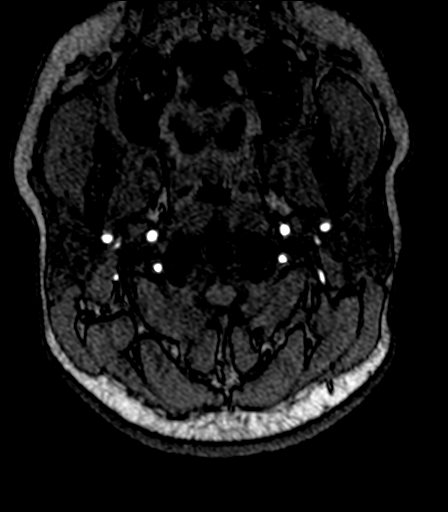
[im 10/156]
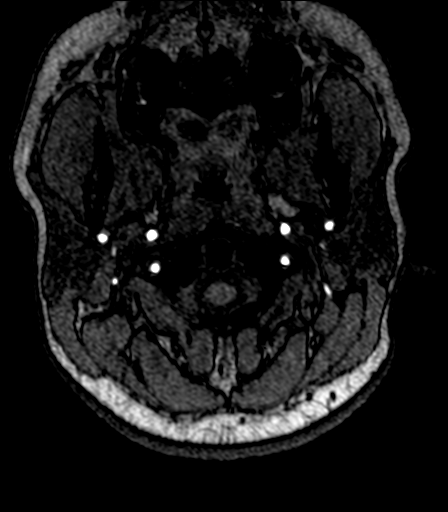
[im 14/156]
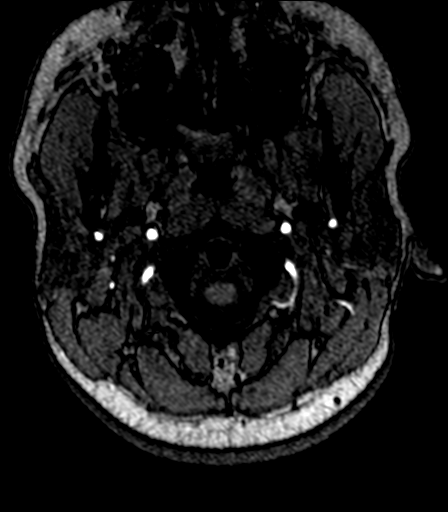
[im 17/156]
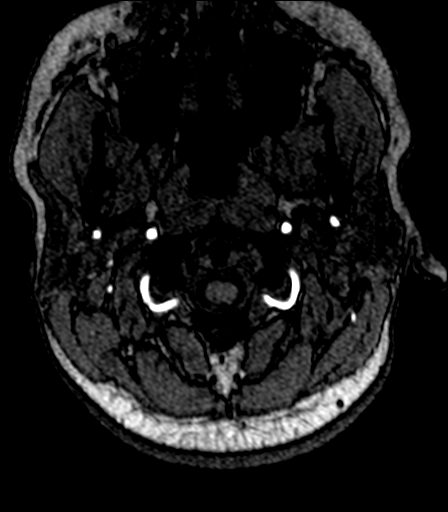
[im 20/156]
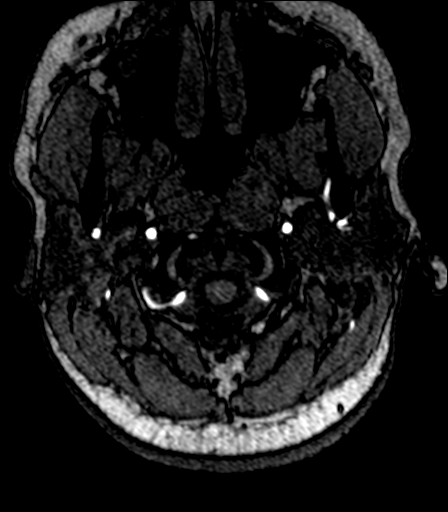
[im 24/156]
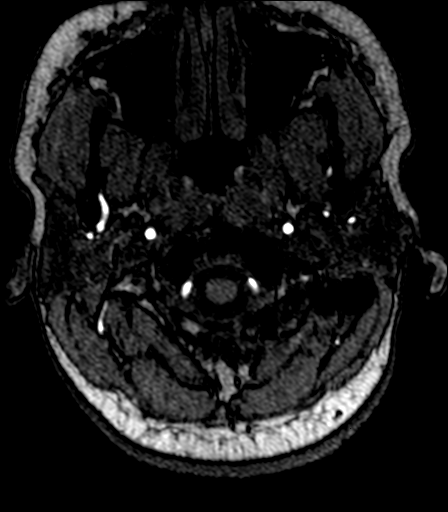
[im 27/156]
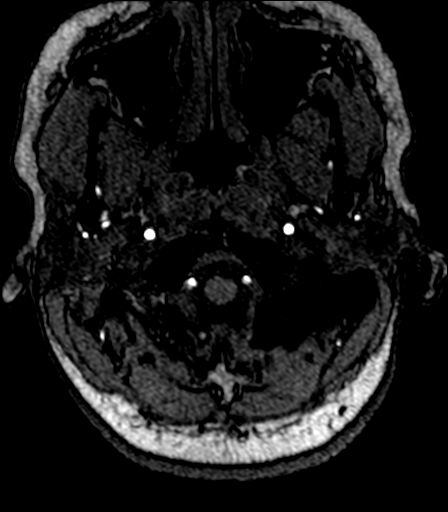
[im 30/156]
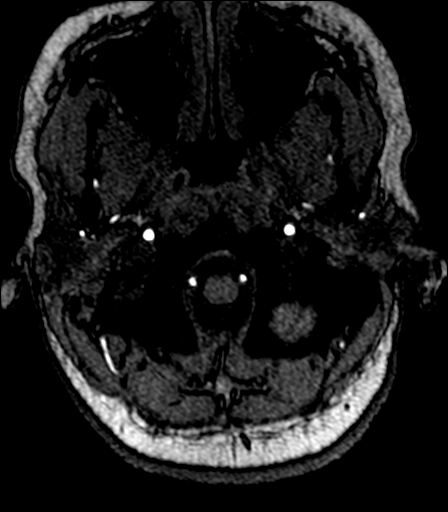
[im 33/156]
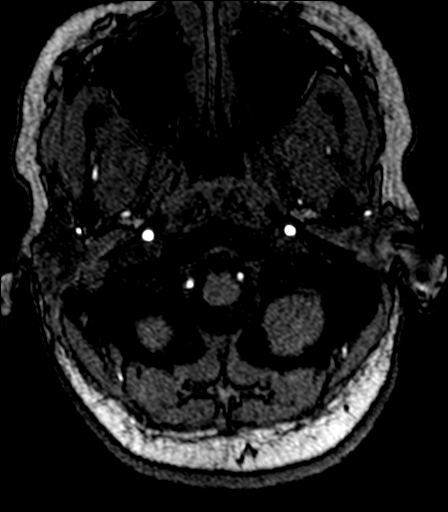
[im 37/156]
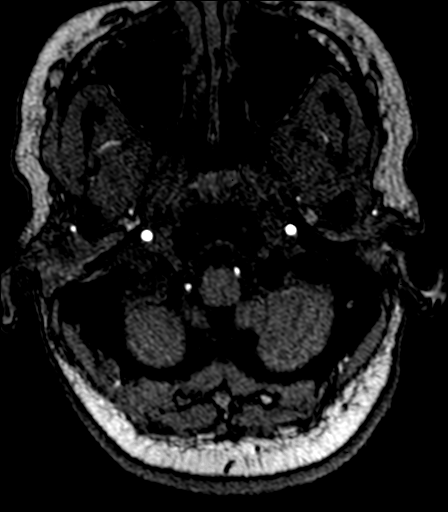
[im 40/156]
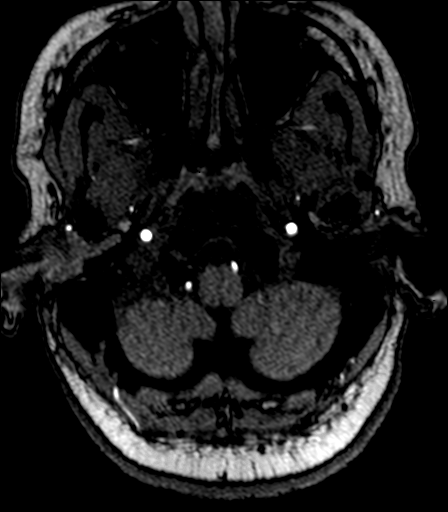
[im 43/156]
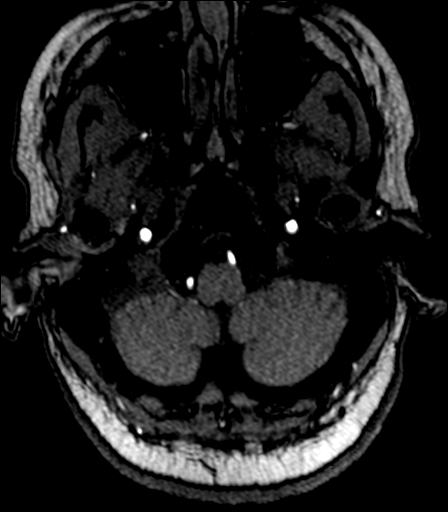
[im 47/156]
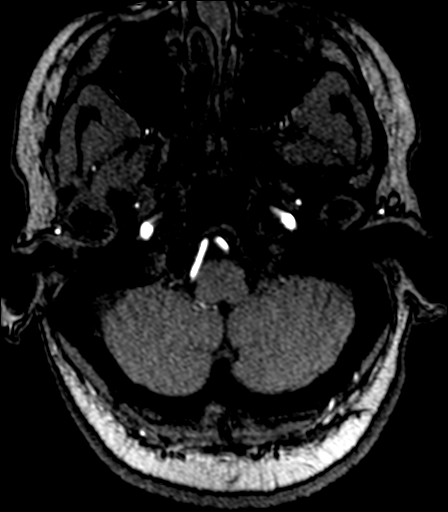
[im 50/156]
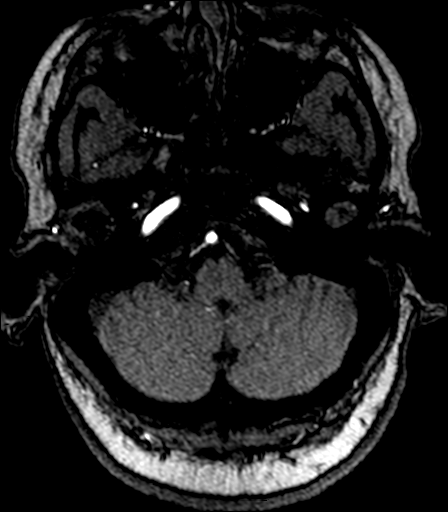
[im 70/156]
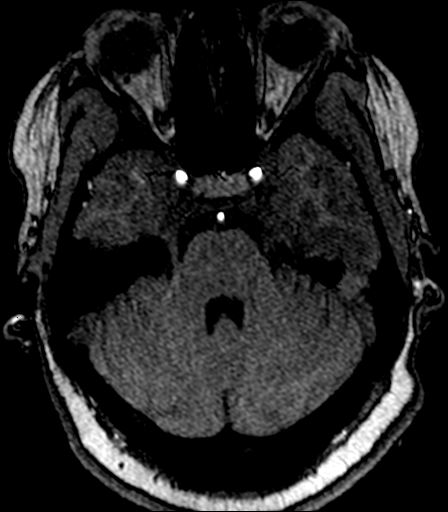
[im 80/156]
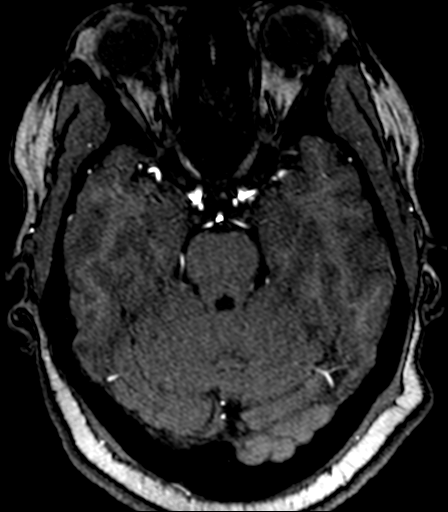
[im 90/156]
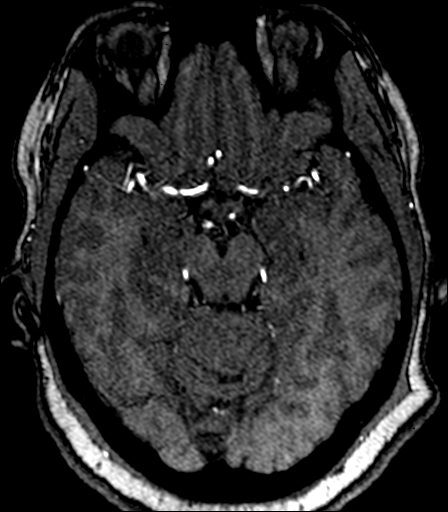
[im 109/156]
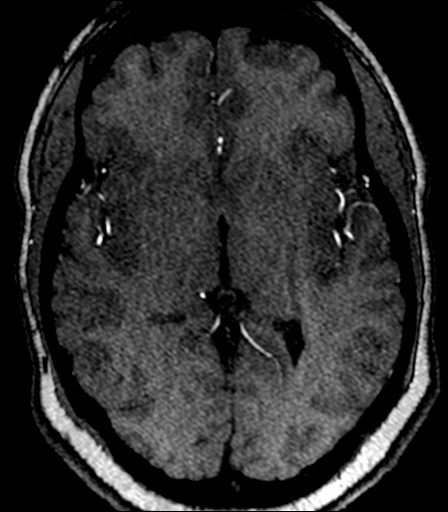
[im 129/156]
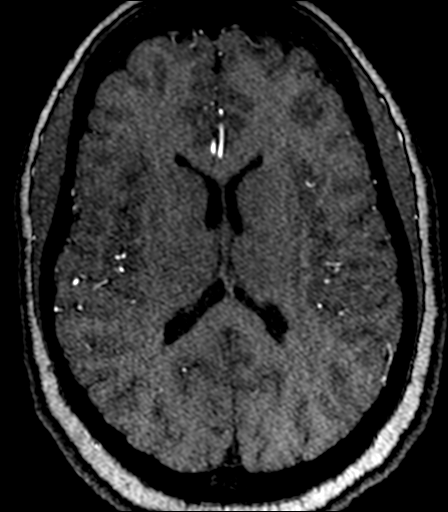
[im 132/156]
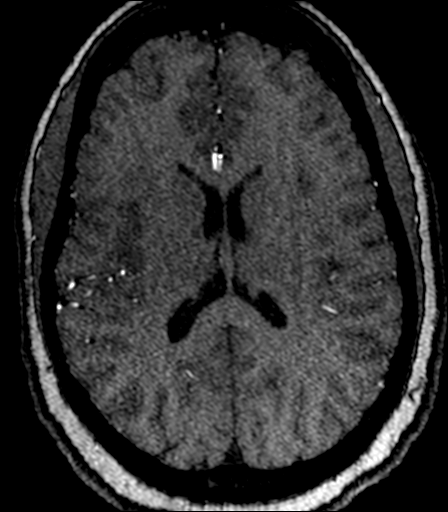
[im 149/156]
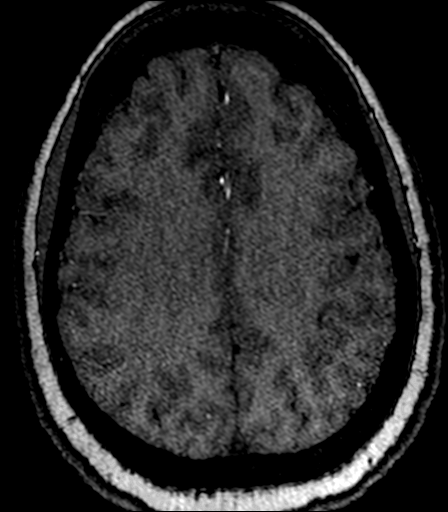

[23 of 48 positions shown; findings below may reference images not displayed]

FINDINGS: Anterior circulation: Both internal carotid arteries widely patent
to the termini without stenosis. A1 segments widely patent. Normal
anterior communicating artery complex. Both anterior cerebral
arteries widely patent to their distal aspects without stenosis. No
M1 stenosis or occlusion. Normal MCA bifurcations. Distal MCA
branches well perfused and symmetric.

Posterior circulation: Vertebral arteries are largely codominant and
patent to the vertebrobasilar junction without stenosis. Neither
PICA origin well visualized. Basilar patent to its distal aspect
without stenosis. Superior cerebellar arteries patent bilaterally.
Both PCA supplied via the basilar as well as robust bilateral
posterior communicating arteries. PCAs well perfused to their distal
aspects without stenosis.

Anatomic variants: None significant. No intracranial aneurysm or
other vascular abnormality.

Other: None.
IMPRESSION: Normal intracranial MRA. No intracranial aneurysm or other vascular
abnormality.

## 2023-05-13 DIAGNOSIS — Z1231 Encounter for screening mammogram for malignant neoplasm of breast: Secondary | ICD-10-CM

## 2023-12-18 ENCOUNTER — Other Ambulatory Visit: Payer: Self-pay | Admitting: Medical Genetics

## 2023-12-23 ENCOUNTER — Other Ambulatory Visit (HOSPITAL_COMMUNITY)
Admission: RE | Admit: 2023-12-23 | Discharge: 2023-12-23 | Disposition: A | Discharge status: Home / Self Care | Payer: Self-pay | Source: Ambulatory Visit | Attending: Medical Genetics | Admitting: Medical Genetics

## 2024-01-03 LAB — GENECONNECT MOLECULAR SCREEN: Genetic Analysis Overall Interpretation: NEGATIVE
